# Patient Record
Sex: Female | Born: 1955 | Race: White | Hispanic: No | Marital: Married | Smoking: Never smoker
Health system: Southern US, Community
[De-identification: ages and names within clinical notes are randomized; demographics above are authoritative.]

## PROBLEM LIST (undated history)

## (undated) DIAGNOSIS — M3500 Sicca syndrome, unspecified: Secondary | ICD-10-CM

## (undated) DIAGNOSIS — N3281 Overactive bladder: Secondary | ICD-10-CM

## (undated) DIAGNOSIS — M797 Fibromyalgia: Secondary | ICD-10-CM

## (undated) DIAGNOSIS — Z9109 Other allergy status, other than to drugs and biological substances: Secondary | ICD-10-CM

## (undated) DIAGNOSIS — I4891 Unspecified atrial fibrillation: Secondary | ICD-10-CM

## (undated) DIAGNOSIS — I509 Heart failure, unspecified: Secondary | ICD-10-CM

## (undated) DIAGNOSIS — K5792 Diverticulitis of intestine, part unspecified, without perforation or abscess without bleeding: Secondary | ICD-10-CM

## (undated) DIAGNOSIS — N189 Chronic kidney disease, unspecified: Secondary | ICD-10-CM

## (undated) DIAGNOSIS — J45909 Unspecified asthma, uncomplicated: Secondary | ICD-10-CM

## (undated) DIAGNOSIS — Z950 Presence of cardiac pacemaker: Secondary | ICD-10-CM

## (undated) DIAGNOSIS — I4892 Unspecified atrial flutter: Secondary | ICD-10-CM

## (undated) DIAGNOSIS — K219 Gastro-esophageal reflux disease without esophagitis: Secondary | ICD-10-CM

## (undated) DIAGNOSIS — I499 Cardiac arrhythmia, unspecified: Secondary | ICD-10-CM

## (undated) DIAGNOSIS — M1990 Unspecified osteoarthritis, unspecified site: Secondary | ICD-10-CM

## (undated) DIAGNOSIS — F329 Major depressive disorder, single episode, unspecified: Secondary | ICD-10-CM

## (undated) DIAGNOSIS — F419 Anxiety disorder, unspecified: Secondary | ICD-10-CM

## (undated) DIAGNOSIS — R51 Headache: Secondary | ICD-10-CM

## (undated) DIAGNOSIS — D649 Anemia, unspecified: Secondary | ICD-10-CM

## (undated) DIAGNOSIS — K5790 Diverticulosis of intestine, part unspecified, without perforation or abscess without bleeding: Secondary | ICD-10-CM

## (undated) DIAGNOSIS — M5030 Other cervical disc degeneration, unspecified cervical region: Secondary | ICD-10-CM

## (undated) DIAGNOSIS — K5732 Diverticulitis of large intestine without perforation or abscess without bleeding: Secondary | ICD-10-CM

## (undated) DIAGNOSIS — R112 Nausea with vomiting, unspecified: Secondary | ICD-10-CM

## (undated) DIAGNOSIS — J189 Pneumonia, unspecified organism: Secondary | ICD-10-CM

## (undated) DIAGNOSIS — G2581 Restless legs syndrome: Secondary | ICD-10-CM

## (undated) DIAGNOSIS — Z9889 Other specified postprocedural states: Secondary | ICD-10-CM

## (undated) DIAGNOSIS — Z8614 Personal history of Methicillin resistant Staphylococcus aureus infection: Secondary | ICD-10-CM

## (undated) DIAGNOSIS — I10 Essential (primary) hypertension: Secondary | ICD-10-CM

## (undated) DIAGNOSIS — G4730 Sleep apnea, unspecified: Secondary | ICD-10-CM

## (undated) DIAGNOSIS — C801 Malignant (primary) neoplasm, unspecified: Secondary | ICD-10-CM

## (undated) DIAGNOSIS — F32A Depression, unspecified: Secondary | ICD-10-CM

## (undated) HISTORY — PX: BACK SURGERY: SHX140

## (undated) HISTORY — DX: Sjogren syndrome, unspecified: M35.00

## (undated) HISTORY — PX: SHOULDER ARTHROSCOPY: SHX128

## (undated) HISTORY — PX: INSERT / REPLACE / REMOVE PACEMAKER: SUR710

## (undated) HISTORY — PX: NASAL SEPTUM SURGERY: SHX37

## (undated) HISTORY — PX: ANKLE FUSION: SHX881

## (undated) HISTORY — PX: CHOLECYSTECTOMY: SHX55

## (undated) HISTORY — PX: ANKLE ARTHROSCOPY: SUR85

---

## 2007-05-24 ENCOUNTER — Encounter: Admission: RE | Admit: 2007-05-24 | Discharge: 2007-05-24 | Payer: Self-pay | Admitting: *Deleted

## 2007-05-24 ENCOUNTER — Ambulatory Visit (HOSPITAL_COMMUNITY): Admission: RE | Admit: 2007-05-24 | Discharge: 2007-05-24 | Payer: Self-pay | Admitting: *Deleted

## 2007-06-01 ENCOUNTER — Ambulatory Visit (HOSPITAL_COMMUNITY): Admission: RE | Admit: 2007-06-01 | Discharge: 2007-06-01 | Payer: Self-pay | Admitting: *Deleted

## 2010-10-11 ENCOUNTER — Other Ambulatory Visit: Payer: Self-pay | Admitting: Obstetrics and Gynecology

## 2010-10-11 DIAGNOSIS — R921 Mammographic calcification found on diagnostic imaging of breast: Secondary | ICD-10-CM

## 2010-10-14 ENCOUNTER — Other Ambulatory Visit: Payer: Self-pay | Admitting: Obstetrics and Gynecology

## 2010-10-14 ENCOUNTER — Ambulatory Visit
Admission: RE | Admit: 2010-10-14 | Discharge: 2010-10-14 | Disposition: A | Discharge status: Home / Self Care | Payer: BC Managed Care – PPO | Source: Ambulatory Visit | Attending: Obstetrics and Gynecology | Admitting: Obstetrics and Gynecology

## 2010-10-14 DIAGNOSIS — R921 Mammographic calcification found on diagnostic imaging of breast: Secondary | ICD-10-CM

## 2011-01-15 ENCOUNTER — Other Ambulatory Visit: Payer: Self-pay | Admitting: Obstetrics and Gynecology

## 2011-01-15 DIAGNOSIS — R921 Mammographic calcification found on diagnostic imaging of breast: Secondary | ICD-10-CM

## 2011-02-05 ENCOUNTER — Ambulatory Visit
Admission: RE | Admit: 2011-02-05 | Discharge: 2011-02-05 | Disposition: A | Discharge status: Home / Self Care | Payer: BC Managed Care – PPO | Source: Ambulatory Visit | Attending: Obstetrics and Gynecology | Admitting: Obstetrics and Gynecology

## 2011-02-05 DIAGNOSIS — R921 Mammographic calcification found on diagnostic imaging of breast: Secondary | ICD-10-CM

## 2011-12-08 ENCOUNTER — Other Ambulatory Visit: Payer: Self-pay | Admitting: Obstetrics and Gynecology

## 2011-12-08 DIAGNOSIS — R921 Mammographic calcification found on diagnostic imaging of breast: Secondary | ICD-10-CM

## 2011-12-16 ENCOUNTER — Ambulatory Visit
Admission: RE | Admit: 2011-12-16 | Discharge: 2011-12-16 | Disposition: A | Discharge status: Home / Self Care | Payer: BC Managed Care – PPO | Source: Ambulatory Visit | Attending: Obstetrics and Gynecology | Admitting: Obstetrics and Gynecology

## 2011-12-16 DIAGNOSIS — R921 Mammographic calcification found on diagnostic imaging of breast: Secondary | ICD-10-CM

## 2012-03-18 ENCOUNTER — Encounter (INDEPENDENT_AMBULATORY_CARE_PROVIDER_SITE_OTHER): Payer: BC Managed Care – PPO | Admitting: Hematology and Oncology

## 2012-03-18 DIAGNOSIS — D0590 Unspecified type of carcinoma in situ of unspecified breast: Secondary | ICD-10-CM

## 2012-03-18 DIAGNOSIS — I10 Essential (primary) hypertension: Secondary | ICD-10-CM

## 2012-03-18 DIAGNOSIS — E119 Type 2 diabetes mellitus without complications: Secondary | ICD-10-CM

## 2012-03-18 HISTORY — PX: MASTECTOMY: SHX3

## 2012-04-20 ENCOUNTER — Encounter (INDEPENDENT_AMBULATORY_CARE_PROVIDER_SITE_OTHER): Payer: BC Managed Care – PPO | Admitting: Hematology and Oncology

## 2012-04-20 DIAGNOSIS — R232 Flushing: Secondary | ICD-10-CM

## 2012-04-20 DIAGNOSIS — Z9010 Acquired absence of unspecified breast and nipple: Secondary | ICD-10-CM

## 2012-04-20 DIAGNOSIS — D0590 Unspecified type of carcinoma in situ of unspecified breast: Secondary | ICD-10-CM

## 2012-05-07 ENCOUNTER — Encounter (INDEPENDENT_AMBULATORY_CARE_PROVIDER_SITE_OTHER): Payer: BC Managed Care – PPO | Admitting: Hematology and Oncology

## 2012-05-07 DIAGNOSIS — C50919 Malignant neoplasm of unspecified site of unspecified female breast: Secondary | ICD-10-CM

## 2012-05-07 DIAGNOSIS — K219 Gastro-esophageal reflux disease without esophagitis: Secondary | ICD-10-CM

## 2012-05-07 DIAGNOSIS — I10 Essential (primary) hypertension: Secondary | ICD-10-CM

## 2012-05-20 ENCOUNTER — Encounter (HOSPITAL_BASED_OUTPATIENT_CLINIC_OR_DEPARTMENT_OTHER): Payer: Self-pay | Admitting: *Deleted

## 2012-05-21 ENCOUNTER — Encounter (HOSPITAL_BASED_OUTPATIENT_CLINIC_OR_DEPARTMENT_OTHER)
Admission: RE | Admit: 2012-05-21 | Discharge: 2012-05-21 | Disposition: A | Discharge status: Home / Self Care | Payer: BC Managed Care – PPO | Source: Ambulatory Visit | Attending: Specialist | Admitting: Specialist

## 2012-05-21 LAB — BASIC METABOLIC PANEL
BUN: 11 mg/dL (ref 6–23)
Creatinine, Ser: 0.69 mg/dL (ref 0.50–1.10)
GFR calc Af Amer: >90 mL/min (ref >90)
GFR calc non Af Amer: >90 mL/min (ref >90)

## 2012-05-24 ENCOUNTER — Encounter (HOSPITAL_BASED_OUTPATIENT_CLINIC_OR_DEPARTMENT_OTHER): Payer: Self-pay | Admitting: Anesthesiology

## 2012-05-24 ENCOUNTER — Encounter (HOSPITAL_BASED_OUTPATIENT_CLINIC_OR_DEPARTMENT_OTHER)
Admission: RE | Disposition: A | Discharge status: Home / Self Care | Payer: Self-pay | Source: Ambulatory Visit | Attending: Specialist

## 2012-05-24 ENCOUNTER — Encounter (HOSPITAL_BASED_OUTPATIENT_CLINIC_OR_DEPARTMENT_OTHER): Payer: Self-pay | Admitting: *Deleted

## 2012-05-24 ENCOUNTER — Ambulatory Visit (HOSPITAL_BASED_OUTPATIENT_CLINIC_OR_DEPARTMENT_OTHER): Payer: BC Managed Care – PPO | Admitting: Anesthesiology

## 2012-05-24 ENCOUNTER — Ambulatory Visit (HOSPITAL_BASED_OUTPATIENT_CLINIC_OR_DEPARTMENT_OTHER)
Admission: RE | Admit: 2012-05-24 | Discharge: 2012-05-24 | Disposition: A | Discharge status: Home / Self Care | Payer: BC Managed Care – PPO | Source: Ambulatory Visit | Attending: Specialist | Admitting: Specialist

## 2012-05-24 DIAGNOSIS — Q838 Other congenital malformations of breast: Secondary | ICD-10-CM | POA: Insufficient documentation

## 2012-05-24 DIAGNOSIS — I10 Essential (primary) hypertension: Secondary | ICD-10-CM | POA: Insufficient documentation

## 2012-05-24 DIAGNOSIS — Z421 Encounter for breast reconstruction following mastectomy: Secondary | ICD-10-CM | POA: Insufficient documentation

## 2012-05-24 DIAGNOSIS — Z9010 Acquired absence of unspecified breast and nipple: Secondary | ICD-10-CM | POA: Insufficient documentation

## 2012-05-24 DIAGNOSIS — Z01812 Encounter for preprocedural laboratory examination: Secondary | ICD-10-CM | POA: Insufficient documentation

## 2012-05-24 DIAGNOSIS — E119 Type 2 diabetes mellitus without complications: Secondary | ICD-10-CM | POA: Insufficient documentation

## 2012-05-24 DIAGNOSIS — Z853 Personal history of malignant neoplasm of breast: Secondary | ICD-10-CM | POA: Insufficient documentation

## 2012-05-24 DIAGNOSIS — Z01810 Encounter for preprocedural cardiovascular examination: Secondary | ICD-10-CM | POA: Insufficient documentation

## 2012-05-24 HISTORY — DX: Gastro-esophageal reflux disease without esophagitis: K21.9

## 2012-05-24 HISTORY — PX: TISSUE EXPANDER PLACEMENT: SHX2530

## 2012-05-24 HISTORY — DX: Headache: R51

## 2012-05-24 HISTORY — PX: BREAST RECONSTRUCTION: SHX9

## 2012-05-24 HISTORY — DX: Anxiety disorder, unspecified: F41.9

## 2012-05-24 HISTORY — DX: Nausea with vomiting, unspecified: R11.2

## 2012-05-24 HISTORY — DX: Major depressive disorder, single episode, unspecified: F32.9

## 2012-05-24 HISTORY — DX: Depression, unspecified: F32.A

## 2012-05-24 HISTORY — DX: Sleep apnea, unspecified: G47.30

## 2012-05-24 HISTORY — DX: Essential (primary) hypertension: I10

## 2012-05-24 HISTORY — DX: Other specified postprocedural states: Z98.890

## 2012-05-24 LAB — GLUCOSE, CAPILLARY: Glucose-Capillary: 183 mg/dL — ABNORMAL HIGH (ref 70–99)

## 2012-05-24 SURGERY — RECONSTRUCTION, BREAST
Anesthesia: General | Site: Breast | Laterality: Right | Wound class: Clean

## 2012-05-24 MED ORDER — HYDROMORPHONE HCL PF 1 MG/ML IJ SOLN
0.2500 mg | INTRAMUSCULAR | Status: DC | PRN
  Administered 2012-05-24 (×2): 0.5 mg via INTRAVENOUS

## 2012-05-24 MED ORDER — ACETAMINOPHEN 10 MG/ML IV SOLN
1000.0000 mg | Freq: Once | INTRAVENOUS | Status: AC
  Administered 2012-05-24: 1000 mg via INTRAVENOUS

## 2012-05-24 MED ORDER — CEFAZOLIN SODIUM 1 G IJ SOLR
INTRAMUSCULAR | Status: DC | PRN
  Administered 2012-05-24: 1 g via INTRAMUSCULAR

## 2012-05-24 MED ORDER — OXYCODONE HCL 5 MG/5ML PO SOLN: 5.0000 mg | Freq: Once | ORAL | Status: DC | PRN

## 2012-05-24 MED ORDER — OXYCODONE HCL 5 MG PO TABS: 5.0000 mg | ORAL_TABLET | Freq: Once | ORAL | Status: DC | PRN

## 2012-05-24 MED ORDER — SUCCINYLCHOLINE CHLORIDE 20 MG/ML IJ SOLN
INTRAMUSCULAR | Status: DC | PRN
  Administered 2012-05-24: 100 mg via INTRAVENOUS

## 2012-05-24 MED ORDER — ONDANSETRON HCL 4 MG/2ML IJ SOLN
INTRAMUSCULAR | Status: DC | PRN
  Administered 2012-05-24: 4 mg via INTRAVENOUS

## 2012-05-24 MED ORDER — MIDAZOLAM HCL 5 MG/5ML IJ SOLN
INTRAMUSCULAR | Status: DC | PRN
  Administered 2012-05-24: 2 mg via INTRAVENOUS

## 2012-05-24 MED ORDER — GLYCOPYRROLATE 0.2 MG/ML IJ SOLN
INTRAMUSCULAR | Status: DC | PRN
  Administered 2012-05-24: 0.6 mg via INTRAVENOUS

## 2012-05-24 MED ORDER — SODIUM CHLORIDE 0.9 % IV SOLN
INTRAVENOUS | Status: DC | PRN
  Administered 2012-05-24: 500 mL via INTRAMUSCULAR

## 2012-05-24 MED ORDER — LIDOCAINE HCL (CARDIAC) 20 MG/ML IV SOLN
INTRAVENOUS | Status: DC | PRN
  Administered 2012-05-24: 50 mg via INTRAVENOUS

## 2012-05-24 MED ORDER — DEXAMETHASONE SODIUM PHOSPHATE 4 MG/ML IJ SOLN
INTRAMUSCULAR | Status: DC | PRN
  Administered 2012-05-24: 10 mg via INTRAVENOUS

## 2012-05-24 MED ORDER — NEOSTIGMINE METHYLSULFATE 1 MG/ML IJ SOLN
INTRAMUSCULAR | Status: DC | PRN
  Administered 2012-05-24: 4 mg via INTRAVENOUS

## 2012-05-24 MED ORDER — CEFAZOLIN SODIUM-DEXTROSE 2-3 GM-% IV SOLR
2.0000 g | INTRAVENOUS | Status: AC
  Administered 2012-05-24: 3 g via INTRAVENOUS

## 2012-05-24 MED ORDER — PROPOFOL 10 MG/ML IV BOLUS
INTRAVENOUS | Status: DC | PRN
  Administered 2012-05-24: 200 mg via INTRAVENOUS

## 2012-05-24 MED ORDER — LACTATED RINGERS IV SOLN
INTRAVENOUS | Status: DC
  Administered 2012-05-24: 07:00:00 via INTRAVENOUS

## 2012-05-24 MED ORDER — SCOPOLAMINE 1 MG/3DAYS TD PT72
1.0000 | MEDICATED_PATCH | TRANSDERMAL | Status: DC
  Administered 2012-05-24: 1.5 mg via TRANSDERMAL

## 2012-05-24 MED ORDER — FENTANYL CITRATE 0.05 MG/ML IJ SOLN
INTRAMUSCULAR | Status: DC | PRN
  Administered 2012-05-24: 100 ug via INTRAVENOUS
  Administered 2012-05-24 (×2): 50 ug via INTRAVENOUS

## 2012-05-24 MED ORDER — SODIUM CHLORIDE 0.9 % IV SOLN
INTRAVENOUS | Status: DC | PRN
  Administered 2012-05-24: 1000 mL via INTRAMUSCULAR

## 2012-05-24 MED ORDER — ROCURONIUM BROMIDE 100 MG/10ML IV SOLN
INTRAVENOUS | Status: DC | PRN
  Administered 2012-05-24: 20 mg via INTRAVENOUS
  Administered 2012-05-24: 10 mg via INTRAVENOUS
  Administered 2012-05-24: 30 mg via INTRAVENOUS

## 2012-05-24 MED ORDER — HYDRALAZINE HCL 20 MG/ML IJ SOLN
INTRAMUSCULAR | Status: DC | PRN
  Administered 2012-05-24: 5 mg via INTRAVENOUS

## 2012-05-24 MED ORDER — LIDOCAINE-EPINEPHRINE 0.5 %-1:200000 IJ SOLN
INTRAMUSCULAR | Status: DC | PRN
  Administered 2012-05-24: 100 mL

## 2012-05-24 SURGICAL SUPPLY — 69 items
BAG DECANTER FOR FLEXI CONT (MISCELLANEOUS) ×2 IMPLANT
BENZOIN TINCTURE PRP APPL 2/3 (GAUZE/BANDAGES/DRESSINGS) ×2 IMPLANT
BLADE HEX COATED 2.75 (ELECTRODE) ×2 IMPLANT
BLADE KNIFE PERSONA 10 (BLADE) ×2 IMPLANT
BLADE KNIFE PERSONA 15 (BLADE) ×2 IMPLANT
CANISTER SUCTION 1200CC (MISCELLANEOUS) ×2 IMPLANT
CLOTH BEACON ORANGE TIMEOUT ST (SAFETY) ×2 IMPLANT
COVER MAYO STAND STRL (DRAPES) ×2 IMPLANT
COVER TABLE BACK 60X90 (DRAPES) ×2 IMPLANT
DECANTER SPIKE VIAL GLASS SM (MISCELLANEOUS) ×2 IMPLANT
DRAIN CHANNEL 10M FLAT 3/4 FLT (DRAIN) ×2 IMPLANT
DRAIN CHANNEL 7F 3/4 FLAT (WOUND CARE) ×4 IMPLANT
DRAIN PENROSE 1/4X12 LTX STRL (WOUND CARE) IMPLANT
DRAPE LAPAROSCOPIC ABDOMINAL (DRAPES) ×2 IMPLANT
DRSG PAD ABDOMINAL 8X10 ST (GAUZE/BANDAGES/DRESSINGS) ×4 IMPLANT
ELECT BLADE 6.5 .24CM SHAFT (ELECTRODE) ×2 IMPLANT
ELECT REM PT RETURN 9FT ADLT (ELECTROSURGICAL) ×2
ELECTRODE REM PT RTRN 9FT ADLT (ELECTROSURGICAL) ×1 IMPLANT
EVACUATOR SILICONE 100CC (DRAIN) ×6 IMPLANT
FILTER 7/8 IN (FILTER) ×2 IMPLANT
GAUZE SPONGE 4X4 12PLY STRL LF (GAUZE/BANDAGES/DRESSINGS) IMPLANT
GAUZE XEROFORM 1X8 LF (GAUZE/BANDAGES/DRESSINGS) ×2 IMPLANT
GAUZE XEROFORM 5X9 LF (GAUZE/BANDAGES/DRESSINGS) ×2 IMPLANT
GLOVE BIO SURGEON STRL SZ 6.5 (GLOVE) ×2 IMPLANT
GLOVE BIOGEL M STRL SZ7.5 (GLOVE) ×2 IMPLANT
GLOVE BIOGEL PI IND STRL 8 (GLOVE) ×1 IMPLANT
GLOVE BIOGEL PI INDICATOR 8 (GLOVE) ×1
GLOVE ECLIPSE 7.0 STRL STRAW (GLOVE) ×2 IMPLANT
GOWN PREVENTION PLUS XLARGE (GOWN DISPOSABLE) IMPLANT
GOWN PREVENTION PLUS XXLARGE (GOWN DISPOSABLE) ×4 IMPLANT
IMPL SALINE SMOOTH RND 575 (Breast) ×1 IMPLANT
IMPLANT SALINE SMOOTH RND 575 (Breast) ×2 IMPLANT
IV NS 500ML (IV SOLUTION) ×4
IV NS 500ML BAXH (IV SOLUTION) ×4 IMPLANT
KIT FILL SYSTEM UNIVERSAL (SET/KITS/TRAYS/PACK) IMPLANT
NDL SAFETY ECLIPSE 18X1.5 (NEEDLE) ×1 IMPLANT
NEEDLE HYPO 18GX1.5 SHARP (NEEDLE) ×1
NEEDLE HYPO 25X1 1.5 SAFETY (NEEDLE) IMPLANT
NEEDLE SPNL 18GX3.5 QUINCKE PK (NEEDLE) ×2 IMPLANT
NS IRRIG 1000ML POUR BTL (IV SOLUTION) ×2 IMPLANT
PACK BASIN DAY SURGERY FS (CUSTOM PROCEDURE TRAY) ×2 IMPLANT
PEN SKIN MARKING BROAD TIP (MISCELLANEOUS) ×2 IMPLANT
PIN SAFETY STERILE (MISCELLANEOUS) ×2 IMPLANT
SLEEVE SCD COMPRESS KNEE MED (MISCELLANEOUS) IMPLANT
SPECIMEN JAR MEDIUM (MISCELLANEOUS) IMPLANT
SPONGE GAUZE 4X4 12PLY (GAUZE/BANDAGES/DRESSINGS) ×2 IMPLANT
SPONGE LAP 18X18 X RAY DECT (DISPOSABLE) ×6 IMPLANT
STRIP SUTURE WOUND CLOSURE 1/2 (SUTURE) ×4 IMPLANT
SUT ETHILON 3 0 PS 1 (SUTURE) ×2 IMPLANT
SUT MNCRL AB 3-0 PS2 18 (SUTURE) ×4 IMPLANT
SUT MON AB 2-0 CT1 36 (SUTURE) ×2 IMPLANT
SUT MON AB 5-0 PS2 18 (SUTURE) IMPLANT
SUT PROLENE 3 0 PS 1 (SUTURE) ×2 IMPLANT
SUT PROLENE 3 0 PS 2 (SUTURE) IMPLANT
SUT PROLENE 4 0 PS 2 18 (SUTURE) IMPLANT
SYR 20CC LL (SYRINGE) IMPLANT
SYR 50ML LL SCALE MARK (SYRINGE) ×4 IMPLANT
SYR BULB IRRIGATION 50ML (SYRINGE) ×2 IMPLANT
SYR CONTROL 10ML LL (SYRINGE) ×2 IMPLANT
TAPE HYPAFIX 6X30 (GAUZE/BANDAGES/DRESSINGS) ×2 IMPLANT
TAPE MEASURE 72IN RETRACT (INSTRUMENTS)
TAPE MEASURE LINEN 72IN RETRCT (INSTRUMENTS) IMPLANT
TOWEL OR 17X24 6PK STRL BLUE (TOWEL DISPOSABLE) ×8 IMPLANT
TRAY DSU PREP LF (CUSTOM PROCEDURE TRAY) ×2 IMPLANT
TUBE CONNECTING 20X1/4 (TUBING) ×4 IMPLANT
UNDERPAD 30X30 INCONTINENT (UNDERPADS AND DIAPERS) ×4 IMPLANT
VAC PENCILS W/TUBING CLEAR (MISCELLANEOUS) ×2 IMPLANT
WATER STERILE IRR 1000ML POUR (IV SOLUTION) ×2 IMPLANT
YANKAUER SUCT BULB TIP NO VENT (SUCTIONS) ×2 IMPLANT

## 2012-05-24 NOTE — Transfer of Care (Signed)
Immediate Anesthesia Transfer of Care Note  Patient: Heidi [name]  Procedure(s) Performed: Procedure(s) (LRB) with comments: BREAST RECONSTRUCTION (Right) - NO FLEX HD TISSUE EXPANDER (Right)  Patient Location: PACU  Anesthesia Type: General  Level of Consciousness: awake, alert  and oriented  Airway & Oxygen Therapy: Patient Spontanous Breathing and Patient connected to face mask oxygen  Post-op Assessment: Report given to PACU RN and Post -op Vital signs reviewed and stable  Post vital signs: Reviewed and stable  Complications: No apparent anesthesia complications

## 2012-05-24 NOTE — Brief Op Note (Signed)
05/24/2012  9:09 AM  PATIENT:  Heidi [name]  56 y.o. female  PRE-OPERATIVE DIAGNOSIS:  RIGHT BREAST CANCER  POST-OPERATIVE DIAGNOSIS:   POST RIGHT BREAST CANCER  PROCEDURE:  Procedure(s) (LRB) with comments: BREAST RECONSTRUCTION (Right) - NO FLEX HD TISSUE EXPANDER (Right)  SURGEON:  Surgeon(s) and Role:    * Louisa Second, MD - Primary  PHYSICIAN ASSISTANT:   ASSISTANTS: none   ANESTHESIA:   general  EBL:  Total I/O In: 1100 [I.V.:1100] Out: -   BLOOD ADMINISTERED:none  DRAINS:  Right and left lateral chest  LOCAL MEDICATIONS USED:  LIDOCAINE   SPECIMEN:  Excision  DISPOSITION OF SPECIMEN:  PATHOLOGY  COUNTS:  YES  TOURNIQUET:  * No tourniquets in log *  DICTATION: .Other Dictation: Dictation Number [id number]  PLAN OF CARE: Discharge to home after PACU  PATIENT DISPOSITION:  PACU - hemodynamically stable.   Delay start of Pharmacological VTE agent (>24hrs) due to surgical blood loss or risk of bleeding: not applicable

## 2012-05-24 NOTE — Anesthesia Postprocedure Evaluation (Signed)
  Anesthesia Post-op Note  Patient: Heidi [name]  Procedure(s) Performed: Procedure(s) (LRB) with comments: BREAST RECONSTRUCTION (Right) - NO FLEX HD TISSUE EXPANDER (Right)  Patient Location: PACU  Anesthesia Type: General  Level of Consciousness: awake, alert  and oriented  Airway and Oxygen Therapy: Patient Spontanous Breathing and Patient connected to face mask oxygen  Post-op Pain: moderate  Post-op Assessment: Post-op Vital signs reviewed, Patient's Cardiovascular Status Stable, Respiratory Function Stable, Patent Airway and No signs of Nausea or vomiting  Post-op Vital Signs: Reviewed and stable  Complications: No apparent anesthesia complications

## 2012-05-24 NOTE — Anesthesia Procedure Notes (Signed)
Procedure Name: Intubation Performed by: Malikiah Debarr W Pre-anesthesia Checklist: Patient identified, Timeout performed, Emergency Drugs available, Suction available and Patient being monitored Patient Re-evaluated:Patient Re-evaluated prior to inductionOxygen Delivery Method: Circle system utilized Preoxygenation: Pre-oxygenation with 100% oxygen Intubation Type: IV induction Ventilation: Mask ventilation without difficulty Laryngoscope Size: Miller and 2 Grade View: Grade I Tube type: Oral Tube size: 7.0 mm Number of attempts: 1 Airway Equipment and Method: Stylet Placement Confirmation: ETT inserted through vocal cords under direct vision,  breath sounds checked- equal and bilateral and positive ETCO2 Secured at: 22 cm Tube secured with: Tape Dental Injury: Teeth and Oropharynx as per pre-operative assessment      

## 2012-05-24 NOTE — H&P (Signed)
Heidi [name] is an 56 y.o. female.   Chief Complaint: Absence of right breast HPI: SP right breast removal for breast cancer. Admitted for reconstruction with tissue expander  Past Medical History  Diagnosis Date  . Anxiety   . Depression   . Hypertension   . Sleep apnea     uses CPAP nightly  . Bronchitis     uses ProAir prn  . Diabetes mellitus     IDDM  . GERD (gastroesophageal reflux disease)   . Headache   . PONV (postoperative nausea and vomiting)     Past Surgical History  Procedure Date  . Mastectomy 03-2012    Rt  . Shoulder arthroscopy     RT  . Ankle arthroscopy     LT  . Cesarean section     History reviewed. No pertinent family history. Social History:  reports that she has never smoked. She does not have any smokeless tobacco history on file. She reports that she does not drink alcohol or use illicit drugs.  Allergies:  Allergies  Allergen Reactions  . Sulfa Antibiotics Rash    Medications Prior to Admission  Medication Sig Dispense Refill  . benazepril (LOTENSIN) 20 MG tablet Take 20 mg by mouth daily.      . benazepril-hydrochlorthiazide (LOTENSIN HCT) 20-12.5 MG per tablet Take 1 tablet by mouth daily.      . clonazePAM (KLONOPIN) 1 MG tablet Take 1 mg by mouth 2 (two) times daily as needed.      Marland Kitchen dexlansoprazole (DEXILANT) 60 MG capsule Take 60 mg by mouth daily.      . DULoxetine (CYMBALTA) 60 MG capsule Take 60 mg by mouth daily.      . fexofenadine-pseudoephedrine (ALLEGRA-D 24) 180-240 MG per 24 hr tablet Take 1 tablet by mouth daily.      . hydrOXYzine (ATARAX/VISTARIL) 25 MG tablet Take 25 mg by mouth 3 (three) times daily as needed.      Marland Kitchen ibuprofen (ADVIL,MOTRIN) 800 MG tablet Take 800 mg by mouth every 8 (eight) hours as needed.      . insulin lispro protamine-insulin lispro (HUMALOG 75/25) (75-25) 100 UNIT/ML SUSP Inject 7 Units into the skin daily with breakfast.      . lurasidone (LATUDA) 40 MG TABS Take by mouth daily with  breakfast.      . oxybutynin (DITROPAN-XL) 10 MG 24 hr tablet Take 10 mg by mouth daily.      . ranitidine (ZANTAC) 150 MG tablet Take 150 mg by mouth 2 (two) times daily.      Marland Kitchen topiramate (TOPAMAX) 200 MG tablet Take 200 mg by mouth 2 (two) times daily.      . traZODone (DESYREL) 150 MG tablet Take 150 mg by mouth at bedtime.      Marland Kitchen albuterol (PROVENTIL) (2.5 MG/3ML) 0.083% nebulizer solution Take 2.5 mg by nebulization every 6 (six) hours as needed.      . carbamazepine (TEGRETOL) 100 MG chewable tablet Chew 200 mg by mouth 2 (two) times daily.      . norethindrone (AYGESTIN) 5 MG tablet Take 5 mg by mouth daily.        Results for orders placed during the hospital encounter of 05/24/12 (from the past 48 hour(s))  GLUCOSE, CAPILLARY     Status: Abnormal   Collection Time   05/24/12  6:41 AM      Component Value Range Comment   Glucose-Capillary 135 (*) 70 - 99 mg/dL    No  results found.  Review of Systems  Constitutional: Negative.   HENT: Negative.   Eyes: Negative.   Respiratory: Negative.   Cardiovascular: Negative.   Gastrointestinal: Negative.   Genitourinary: Negative.   Musculoskeletal: Negative.   Skin: Positive for rash.  Endo/Heme/Allergies: Negative.   Psychiatric/Behavioral: Negative.     Blood pressure 113/70, pulse 50, temperature 98 F (36.7 C), temperature source Oral, resp. rate 18, height 5\' 5"  (1.651 m), weight 132.11 kg (291 lb 4 oz), last menstrual period 05/20/2012, SpO2 96.00%. Physical Exam   Assessment/Plan Right breast cancer / Reconstruction with tissue expander  Joell Usman L 05/24/2012, 7:39 AM

## 2012-05-24 NOTE — Anesthesia Preprocedure Evaluation (Signed)
Anesthesia Evaluation  Patient identified by MRN, date of birth, ID band Patient awake    Reviewed: Allergy & Precautions, H&P , NPO status , Patient's Chart, lab work & pertinent test results  History of Anesthesia Complications (+) PONV  Airway Mallampati: III TM Distance: >3 FB Neck ROM: Full    Dental No notable dental hx. (+) Teeth Intact and Dental Advisory Given   Pulmonary sleep apnea and Continuous Positive Airway Pressure Ventilation ,  breath sounds clear to auscultation  Pulmonary exam normal       Cardiovascular hypertension, On Medications Rhythm:Regular Rate:Normal     Neuro/Psych PSYCHIATRIC DISORDERS negative neurological ROS     GI/Hepatic Neg liver ROS, GERD-  Medicated and Controlled,  Endo/Other  diabetes, Type 2, Oral Hypoglycemic AgentsMorbid obesity  Renal/GU negative Renal ROS  negative genitourinary   Musculoskeletal   Abdominal (+) + obese,   Peds  Hematology negative hematology ROS (+)   Anesthesia Other Findings   Reproductive/Obstetrics negative OB ROS                           Anesthesia Physical Anesthesia Plan  ASA: III  Anesthesia Plan: General   Post-op Pain Management:    Induction: Intravenous  Airway Management Planned: Oral ETT and Video Laryngoscope Planned  Additional Equipment:   Intra-op Plan:   Post-operative Plan: Extubation in OR  Informed Consent: I have reviewed the patients History and Physical, chart, labs and discussed the procedure including the risks, benefits and alternatives for the proposed anesthesia with the patient or authorized representative who has indicated his/her understanding and acceptance.   Dental advisory given  Plan Discussed with: CRNA  Anesthesia Plan Comments:         Anesthesia Quick Evaluation

## 2012-05-25 ENCOUNTER — Encounter (HOSPITAL_BASED_OUTPATIENT_CLINIC_OR_DEPARTMENT_OTHER): Payer: Self-pay | Admitting: Specialist

## 2012-05-25 NOTE — Op Note (Signed)
NAMETIMMIE, CALIX               ACCOUNT NO.:  [id number]  MEDICAL RECORD NO.:  [id number]  LOCATION:                               FACILITY:  [facility]  PHYSICIAN:  Earvin Hansen L. [name], M.D.   DATE OF BIRTH:  DATE OF PROCEDURE:  05/24/2012 DATE OF DISCHARGE:  05/24/2012                              OPERATIVE REPORT   A 56 year old lady with history of right breast cancer.  She has had a mastectomy done and is now being prepared for reconstruction first stage.  She also has severe amount of accessory breast tissue on the right side in the axillary region that is going to be excised as well.  PROCEDURES PLANNED: 1. Right breast reconstruction using tissue expansion, subpectoral     placement. 2. Excision of a large accessory breast tissue, right breast.  ANESTHESIA:  General.  DESCRIPTION OF PROCEDURE:  Preoperatively, the patient was set up and drawn for the reconstruction, marked the midline as well as the inframammary fold lines as well as the demarcation of all the large masses of accessory breast tissue.  She then underwent general anesthesia, intubated orally.  Prep was done to the chest, breast areas in routine fashion using Hibiclens soap and solution, walled off with sterile towels and drapes so as to make a sterile field.  Xylocaine 0.5% with epinephrine was injected locally, a total of 150 mL at the right chest area.  After sterile towels and drapes were placed, the previous inframammary incision and transverse incision of the breast was entered with a #15 blade, carried down through the skin and subcutaneous tissue to underlying pectoralis major muscle.  The muscle was then divided in direction of its fibers and subpectoral dissection was done medially, laterally, inferiorly, and superiorly.  Next using light retraction, I was able to dissect a plane more using the Bovie anticoagulation over the fifth rib down to the previously drawn inframammary fold.  We  then outlined excision for the accessory breast tissue as well.  Subpectoral plane was packed.  Excision of a large elliptical mass of accessory breast tissue was then done using the Bovie for cutting and coagulation. Hemostasis again maintained with the Bovie anticoagulation.  This piece was then removed from the field.  The flaps were advanced and then closed with multiple sutures of 2-0 Monocryl deep subcutaneously and then running subcuticular stitch of 3-0 Monocryl.  Next attention was drawn back to the subpectoral dissection.  The packs were removed. Hemostasis was maintained and then a mentor tissue expander was placed in this space and this is  a style 1400.  We placed 700 mL in it.  Lot number is [id number], serial number is [id number].  Total of 7 mL of sterile saline was placed with a gram of Ancef.  This fit in very nicely with the field system.  The muscles were then reclosed with 2-0 Monocryl.  I was able to make a subcutaneous plane laterally to this to allow the port to be situated and stay with 3-0 Monocryl.  The subcutaneous closure was done with 3-0 Monocryl and then a running subcuticular stitch of 3-0 Monocryl.  We placed a drain in each space on the  accessory breast tissue, a #7 Blake drain as well as at the reconstruction site. Reconstruction site drain was labeled #1.  The accessory breast tissue site drain was labeled #2.  The wounds were cleansed.  Sterile drapes were applied.  Steri-Strips, 4x4s, ABDs, and Hypafix tape.  She withstood all the procedures very, very well and was taken to recovery in excellent condition.  ESTIMATED BLOOD LOSS:  150 mL.  COMPLICATIONS:  None.  The patient will be discharged today.  Postop instructions were given to her preoperatively as well as prescriptions for both pain and antibiotic therapy which we started antibiotics 2 days ago to allow the antibiotics to get into the soft tissue since this patient is  diabetic.     [name]. [name], M.D.     [name]  D:  05/24/2012  T:  05/24/2012  Job:  [id number]

## 2012-05-26 LAB — POCT HEMOGLOBIN-HEMACUE: Hemoglobin: 12.5 g/dL (ref 12.0–15.0)

## 2012-08-18 DIAGNOSIS — Z8614 Personal history of Methicillin resistant Staphylococcus aureus infection: Secondary | ICD-10-CM

## 2012-08-18 HISTORY — DX: Personal history of Methicillin resistant Staphylococcus aureus infection: Z86.14

## 2012-09-23 ENCOUNTER — Ambulatory Visit (INDEPENDENT_AMBULATORY_CARE_PROVIDER_SITE_OTHER): Payer: BC Managed Care – PPO | Admitting: Internal Medicine

## 2012-09-23 ENCOUNTER — Ambulatory Visit: Payer: BC Managed Care – PPO | Admitting: Internal Medicine

## 2012-09-23 ENCOUNTER — Encounter: Payer: Self-pay | Admitting: Internal Medicine

## 2012-09-23 VITALS — BP 153/77 | HR 62 | Temp 98.4°F | Ht 66.0 in | Wt 274.0 lb

## 2012-09-23 DIAGNOSIS — T8149XA Infection following a procedure, other surgical site, initial encounter: Secondary | ICD-10-CM

## 2012-09-23 DIAGNOSIS — T8140XA Infection following a procedure, unspecified, initial encounter: Secondary | ICD-10-CM

## 2012-09-23 NOTE — Assessment & Plan Note (Signed)
She does have some purulence from her sites and I have set this off for culture. I am going to await culture prior to starting antibiotics since the possibilities are broad including gram-positive and gram-negative. I will direct therapy to the bacteria. If it is culture negative I will use IV vancomycin and oral Levaquin.

## 2012-09-23 NOTE — Progress Notes (Signed)
  Subjective:    Patient ID: Heidi [name], female    DOB: 1955/10/18, 57 y.o.   MRN: [id number]  HPI She comes in here for evaluation of her postoperative forms. She has history of breast cancer and had breast removal and in October of 2013 had stage I of the breast reconstructive surgery here. Subsequently she did develop some wound infections and despite multiple courses of antibiotics she has had recurrence of her wounds. She has not had any wound cultures in the past.  She otherwise has had no fever or chills. She does have periods where it improves however then developed an abscess and bursts and requires incision and drainage by her surgeon Dr. [name]. She does relate a history of a staph aureus boil on the back of her neck prior to surgery.  She has been on multiple courses of antibiotics including doxycycline, oral vancomycin( no diarrhea or C. Difficile), and ciprofloxacin that she knows of.     Review of Systems  Constitutional: Negative for fever, chills and fatigue.  Gastrointestinal: Negative for nausea, abdominal pain, diarrhea and constipation.  Skin: Negative for rash.  Psychiatric/Behavioral: The patient is nervous/anxious.        Objective:   Physical Exam  Constitutional: She appears well-developed and well-nourished. No distress.  Skin:       Wound of her right breast area opened under the inferior lateral portion that is about 3 cm long opening with some purulence  2 smaller lesions that are open with some purulence one is about 2-3 mm          Assessment & Plan:

## 2012-09-27 ENCOUNTER — Encounter: Payer: Self-pay | Admitting: Internal Medicine

## 2012-09-27 ENCOUNTER — Telehealth: Payer: Self-pay | Admitting: *Deleted

## 2012-09-27 ENCOUNTER — Other Ambulatory Visit: Payer: Self-pay | Admitting: *Deleted

## 2012-09-27 LAB — WOUND CULTURE

## 2012-09-27 NOTE — Progress Notes (Signed)
error 

## 2012-09-27 NOTE — Telephone Encounter (Signed)
PICC set up as outpatient at [facility], 09/28/12 10:30 am.  [facility] will administer first dose of vancomycin that day.  Will fax lab results (BUN/Creatinine) to [phone] when they are faxed to [place] from Dr. Charlesetta Garibaldi office. Faxed vancomycin orders to [facility] for pharmacy titration, trough goal of 12-15.  They will coordinate delivery/adminstration of antibiotics.

## 2012-10-02 ENCOUNTER — Other Ambulatory Visit: Payer: Self-pay

## 2012-10-04 ENCOUNTER — Encounter: Payer: Self-pay | Admitting: Internal Medicine

## 2012-10-04 ENCOUNTER — Telehealth: Payer: Self-pay | Admitting: *Deleted

## 2012-10-04 NOTE — Telephone Encounter (Signed)
Patient called c/o frequent urination and gastric reflux and wants to know if this could be related to the vancomycin. Please advise Heidi [name]

## 2012-10-05 NOTE — Telephone Encounter (Signed)
no

## 2012-10-05 NOTE — Telephone Encounter (Signed)
Patient notified Heidi [name]  

## 2012-10-07 ENCOUNTER — Encounter: Payer: Self-pay | Admitting: Internal Medicine

## 2012-10-07 ENCOUNTER — Ambulatory Visit (INDEPENDENT_AMBULATORY_CARE_PROVIDER_SITE_OTHER): Payer: BC Managed Care – PPO | Admitting: Internal Medicine

## 2012-10-07 VITALS — BP 150/84 | HR 67 | Temp 98.5°F | Ht 66.0 in | Wt 273.0 lb

## 2012-10-07 DIAGNOSIS — T8149XA Infection following a procedure, other surgical site, initial encounter: Secondary | ICD-10-CM

## 2012-10-07 DIAGNOSIS — T8140XA Infection following a procedure, unspecified, initial encounter: Secondary | ICD-10-CM

## 2012-10-07 MED ORDER — DOXYCYCLINE HYCLATE 100 MG PO TABS: 100.0000 mg | ORAL_TABLET | Freq: Two times a day (BID) | ORAL | Status: DC

## 2012-10-07 NOTE — Progress Notes (Signed)
  Subjective:    Patient ID: Heidi [name], female    DOB: [birth year], 57 y.o.   MRN: [id number]  HPI This in for followup of her wound infection. She is status post mastectomy and developed wounds over the last several months that have been difficult to heal. She's tried multiple antibiotics including 2 courses of doxycycline for about 7-10 days. I saw her last week and cultured the area which did grow MRSA which is sensitive to doxycycline and vancomycin. I started her on IV vancomycin for which she is now about one week into her course. No fever but does endorse some occasional chills. She feels her wound is about the same. She continues to be distressed about the wounds and healing.   Review of Systems  Constitutional: Positive for chills. Negative for fever.  Gastrointestinal: Negative for nausea, abdominal pain and diarrhea.       Objective:   Physical Exam  Skin:  Her right breast area has one persistent wound at the bottom right quadrant that is open as before but with no drainage. No surrounding erythema. Another area that is about one half a centimeter that is new and slight erythema with some fleshy-like consistency          Assessment & Plan:

## 2012-10-07 NOTE — Assessment & Plan Note (Signed)
I do feel this wound is healing up nicely with much less drainage. The culture was from inside the wound so suspect this is the the bacteria. I will continue her for one more week on vancomycin and then I will switch her to doxycycline for another 2 weeks. I will then followup with her at that time. She is going to see her surgeon in the meantime to further manage the wounds and healing. I do though think this is clearing up nicely and did not suspect a prolonged infection

## 2012-10-08 ENCOUNTER — Encounter: Payer: Self-pay | Admitting: Internal Medicine

## 2012-10-12 ENCOUNTER — Telehealth: Payer: Self-pay | Admitting: *Deleted

## 2012-10-12 NOTE — Telephone Encounter (Signed)
Patient notified. Orders faxed to [facility]. Thanks!

## 2012-10-12 NOTE — Telephone Encounter (Signed)
Sounds good.  Doxy should already be at her pharmacy.  It is 100 mg po bid for 2 weeks starting after she finishes her vancomycin. thanks

## 2012-10-12 NOTE — Telephone Encounter (Signed)
Patient and her [facility] RN called, reporting her PICC line pulled out "5 black marks."  RN was able to flush the PICC, but not draw back any blood.  She drew labs via peripheral stick this morning.  Order faxed to [facility] for CXR to confirm PICC placement.  They will call [phone] with results.

## 2012-10-12 NOTE — Telephone Encounter (Signed)
It is still in a good place, ok to continue.  I think for a day or two more and then to oral doxy.  Thanks

## 2012-10-12 NOTE — Telephone Encounter (Signed)
CXR results: "Left-sided PICC line catheter tip terminates in the SVC. Heart size is within normal limits.  The lungs are clear." Please advise if the [facility] RN should pull PICC and stop IV ABX, if it should be replaced, or if it should be stabilized for continuation of IV ABX.

## 2012-10-12 NOTE — Telephone Encounter (Signed)
Notified patient, she is in agreement. She has enough vancomycin to go through Friday, 10/15/12.  [facility] RN can pull PICC Monday, 10/18/12 if you would like.  Please advise on the dosage and duration of doxy therapy for the pharmacy.  I'll be happy to phone that in.

## 2012-10-15 ENCOUNTER — Telehealth: Payer: Self-pay | Admitting: *Deleted

## 2012-10-15 NOTE — Telephone Encounter (Signed)
Verbal order per Dr. Luciana Axe given to [facility] to pull patient's picc line on 10/18/12. Wendall Mola

## 2012-10-15 NOTE — Telephone Encounter (Signed)
Patient still has large holes with drainage, concerned that doxy will not be effective as she has taken this many times in the past without cure.  Concerned that the wounds have not closed more before IV ABX therapy completed (today 10/15/12).  Has the doxy at home to start tomorrow, but would like to know if meropenem is an option before beginning the doxy and before the PICC is pulled on Monday.   (RN also counseled patient on nutritional therapy to assist in wound healing - lean protein, Vitamin C and zinc.) Please advise.  Andree Coss, RN

## 2012-10-15 NOTE — Telephone Encounter (Signed)
Not sure who is around today.  See note from [place].   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  The only antibiotics that are sensitive are vancomycin and doxycycline.  There is little difference between the effectiveness of the two antibiotics so no benefit with prolonged IV therapy, and there  Is risk.  It will likely take weeks before the wound is closed and there is no benefit to continuing IV antibiotics for the wound to heal.  There will be drainage until the wound has closed, that is expected.    Thanks

## 2012-10-15 NOTE — Telephone Encounter (Signed)
Patient notified Heidi [name]  

## 2012-10-20 ENCOUNTER — Encounter: Payer: Self-pay | Admitting: Infectious Diseases

## 2012-10-20 ENCOUNTER — Telehealth: Payer: Self-pay | Admitting: Licensed Clinical Social Worker

## 2012-10-20 NOTE — Telephone Encounter (Signed)
Patient states she has been extremely nauseated since starting Doxycycline, she feels very fatigue and overall not feeling well. She has been taking Phenergan which has helped. Patient thinks it is the antibiotic making her feel this way. Please advise

## 2012-10-20 NOTE — Telephone Encounter (Signed)
Patient states she will stop taking it for now, she has an appointment scheduled with Dr. Luciana Axe for next week, she can be evaluated at that time.

## 2012-10-20 NOTE — Telephone Encounter (Signed)
If not already, taking it with food might help.  If she continues to be intolerant, she could probably just stop since it looked close to being resolved at the last visit and she has had about 3 weeks.  I could see her tomorrow as an overbook for a quick visit to see if she can make it in.  Thanks

## 2012-10-28 ENCOUNTER — Ambulatory Visit (INDEPENDENT_AMBULATORY_CARE_PROVIDER_SITE_OTHER): Payer: BC Managed Care – PPO | Admitting: Internal Medicine

## 2012-10-28 ENCOUNTER — Encounter: Payer: Self-pay | Admitting: Internal Medicine

## 2012-10-28 VITALS — BP 137/80 | HR 69 | Temp 98.1°F | Ht 66.0 in | Wt 270.0 lb

## 2012-10-28 DIAGNOSIS — Z5189 Encounter for other specified aftercare: Secondary | ICD-10-CM

## 2012-10-28 DIAGNOSIS — IMO0001 Reserved for inherently not codable concepts without codable children: Secondary | ICD-10-CM

## 2012-10-30 NOTE — Progress Notes (Signed)
  Subjective:    Patient ID: Heidi [name], female    DOB: 04/01/1956, 57 y.o.   MRN: [id number]  HPI She comes in for follow up of her post op wound infection. She had persistent drainage for several months and multiple antibiotic courses and was started on Iv vanocmycin about 3 weeks ago with a positive culture for MRSA.  She completed 2 weeks of that and transitioned to oral doxycycline based on sensitivities.  She was intolerant of the oral therapy and comes in for follow up off antibiotics.  She has no fever or chills, no further pus or significant drainage.  She feels well and wound is healing nicely.  She tells me she is being scheduled for breast surgery again and is pleased.     Review of Systems  Constitutional: Negative for fever and chills.  Gastrointestinal: Positive for nausea. Negative for vomiting, abdominal pain, diarrhea and constipation.  Skin: Negative for rash.  Neurological: Negative for dizziness.       Objective:   Physical Exam  Skin:  No pus, wound is healing well, no surrounding erythema.          Assessment & Plan:

## 2012-10-30 NOTE — Assessment & Plan Note (Signed)
Healing well and no signs at this time of infection. Follow up PRn.

## 2013-01-11 ENCOUNTER — Encounter (HOSPITAL_BASED_OUTPATIENT_CLINIC_OR_DEPARTMENT_OTHER): Payer: Self-pay | Admitting: *Deleted

## 2013-01-11 NOTE — Progress Notes (Signed)
Pt was here 10/13 for tissue expander in-got infection-had to have iv antibiotics and picc line in-that is out -she had tissue expander out in his office 11/13 Now for TE back in-diabeties better controlled-uses cpap-will bring cpap, all meds, overnight bag in case she has to stay-she did go home 10/13 post op, Will need [employer]

## 2013-01-17 ENCOUNTER — Encounter (HOSPITAL_BASED_OUTPATIENT_CLINIC_OR_DEPARTMENT_OTHER): Payer: Self-pay | Admitting: *Deleted

## 2013-01-17 ENCOUNTER — Encounter (HOSPITAL_BASED_OUTPATIENT_CLINIC_OR_DEPARTMENT_OTHER): Payer: Self-pay | Admitting: Anesthesiology

## 2013-01-17 ENCOUNTER — Ambulatory Visit (HOSPITAL_BASED_OUTPATIENT_CLINIC_OR_DEPARTMENT_OTHER)
Admission: RE | Admit: 2013-01-17 | Discharge: 2013-01-17 | Disposition: A | Discharge status: Home / Self Care | Payer: BC Managed Care – PPO | Source: Ambulatory Visit | Attending: Specialist | Admitting: Specialist

## 2013-01-17 ENCOUNTER — Encounter (HOSPITAL_BASED_OUTPATIENT_CLINIC_OR_DEPARTMENT_OTHER)
Admission: RE | Disposition: A | Discharge status: Home / Self Care | Payer: Self-pay | Source: Ambulatory Visit | Attending: Specialist

## 2013-01-17 ENCOUNTER — Ambulatory Visit (HOSPITAL_BASED_OUTPATIENT_CLINIC_OR_DEPARTMENT_OTHER): Payer: BC Managed Care – PPO | Admitting: Anesthesiology

## 2013-01-17 DIAGNOSIS — Z6841 Body Mass Index (BMI) 40.0 and over, adult: Secondary | ICD-10-CM | POA: Insufficient documentation

## 2013-01-17 DIAGNOSIS — E119 Type 2 diabetes mellitus without complications: Secondary | ICD-10-CM | POA: Insufficient documentation

## 2013-01-17 DIAGNOSIS — F3289 Other specified depressive episodes: Secondary | ICD-10-CM | POA: Insufficient documentation

## 2013-01-17 DIAGNOSIS — J4489 Other specified chronic obstructive pulmonary disease: Secondary | ICD-10-CM | POA: Insufficient documentation

## 2013-01-17 DIAGNOSIS — Z882 Allergy status to sulfonamides status: Secondary | ICD-10-CM | POA: Insufficient documentation

## 2013-01-17 DIAGNOSIS — I10 Essential (primary) hypertension: Secondary | ICD-10-CM | POA: Insufficient documentation

## 2013-01-17 DIAGNOSIS — D649 Anemia, unspecified: Secondary | ICD-10-CM | POA: Insufficient documentation

## 2013-01-17 DIAGNOSIS — F329 Major depressive disorder, single episode, unspecified: Secondary | ICD-10-CM | POA: Insufficient documentation

## 2013-01-17 DIAGNOSIS — N651 Disproportion of reconstructed breast: Secondary | ICD-10-CM | POA: Insufficient documentation

## 2013-01-17 DIAGNOSIS — Z853 Personal history of malignant neoplasm of breast: Secondary | ICD-10-CM | POA: Insufficient documentation

## 2013-01-17 DIAGNOSIS — Z794 Long term (current) use of insulin: Secondary | ICD-10-CM | POA: Insufficient documentation

## 2013-01-17 DIAGNOSIS — Z79899 Other long term (current) drug therapy: Secondary | ICD-10-CM | POA: Insufficient documentation

## 2013-01-17 DIAGNOSIS — M129 Arthropathy, unspecified: Secondary | ICD-10-CM | POA: Insufficient documentation

## 2013-01-17 DIAGNOSIS — K219 Gastro-esophageal reflux disease without esophagitis: Secondary | ICD-10-CM | POA: Insufficient documentation

## 2013-01-17 DIAGNOSIS — G4730 Sleep apnea, unspecified: Secondary | ICD-10-CM | POA: Insufficient documentation

## 2013-01-17 DIAGNOSIS — F411 Generalized anxiety disorder: Secondary | ICD-10-CM | POA: Insufficient documentation

## 2013-01-17 DIAGNOSIS — J449 Chronic obstructive pulmonary disease, unspecified: Secondary | ICD-10-CM | POA: Insufficient documentation

## 2013-01-17 HISTORY — DX: Anemia, unspecified: D64.9

## 2013-01-17 HISTORY — DX: Malignant (primary) neoplasm, unspecified: C80.1

## 2013-01-17 HISTORY — DX: Unspecified osteoarthritis, unspecified site: M19.90

## 2013-01-17 HISTORY — PX: BREAST RECONSTRUCTION: SHX9

## 2013-01-17 LAB — POCT I-STAT, CHEM 8
BUN: 10 mg/dL (ref 6–23)
Calcium, Ion: 1.18 mmol/L (ref 1.12–1.23)
Creatinine, Ser: 0.8 mg/dL (ref 0.50–1.10)
Glucose, Bld: 108 mg/dL — ABNORMAL HIGH (ref 70–99)
Hemoglobin: 10.2 g/dL — ABNORMAL LOW (ref 12.0–15.0)
TCO2: 24 mmol/L (ref 0–100)

## 2013-01-17 SURGERY — RECONSTRUCTION, BREAST
Anesthesia: General | Site: Breast | Laterality: Right | Wound class: Clean

## 2013-01-17 MED ORDER — DEXAMETHASONE SODIUM PHOSPHATE 4 MG/ML IJ SOLN
INTRAMUSCULAR | Status: DC | PRN
  Administered 2013-01-17: 4 mg via INTRAVENOUS

## 2013-01-17 MED ORDER — SODIUM CHLORIDE 0.9 % IR SOLN
Status: DC | PRN
  Administered 2013-01-17: 1

## 2013-01-17 MED ORDER — FENTANYL CITRATE 0.05 MG/ML IJ SOLN: 50.0000 ug | INTRAMUSCULAR | Status: DC | PRN

## 2013-01-17 MED ORDER — GLYCOPYRROLATE 0.2 MG/ML IJ SOLN
INTRAMUSCULAR | Status: DC | PRN
  Administered 2013-01-17: 0.2 mg via INTRAVENOUS
  Administered 2013-01-17: 0.6 mg via INTRAVENOUS

## 2013-01-17 MED ORDER — ROCURONIUM BROMIDE 100 MG/10ML IV SOLN
INTRAVENOUS | Status: DC | PRN
  Administered 2013-01-17: 10 mg via INTRAVENOUS
  Administered 2013-01-17: 5 mg via INTRAVENOUS
  Administered 2013-01-17: 50 mg via INTRAVENOUS

## 2013-01-17 MED ORDER — METOCLOPRAMIDE HCL 5 MG/ML IJ SOLN
INTRAMUSCULAR | Status: DC | PRN
  Administered 2013-01-17: 10 mg via INTRAVENOUS

## 2013-01-17 MED ORDER — LIDOCAINE HCL (CARDIAC) 20 MG/ML IV SOLN
INTRAVENOUS | Status: DC | PRN
  Administered 2013-01-17: 80 mg via INTRAVENOUS

## 2013-01-17 MED ORDER — FENTANYL CITRATE 0.05 MG/ML IJ SOLN
INTRAMUSCULAR | Status: DC | PRN
  Administered 2013-01-17: 100 ug via INTRAVENOUS
  Administered 2013-01-17: 25 ug via INTRAVENOUS

## 2013-01-17 MED ORDER — MIDAZOLAM HCL 5 MG/5ML IJ SOLN
INTRAMUSCULAR | Status: DC | PRN
  Administered 2013-01-17: 2 mg via INTRAVENOUS

## 2013-01-17 MED ORDER — NEOSTIGMINE METHYLSULFATE 1 MG/ML IJ SOLN
INTRAMUSCULAR | Status: DC | PRN
  Administered 2013-01-17: 3 mg via INTRAVENOUS

## 2013-01-17 MED ORDER — CEFAZOLIN SODIUM-DEXTROSE 2-3 GM-% IV SOLR
2.0000 g | INTRAVENOUS | Status: AC
  Administered 2013-01-17: 2 g via INTRAVENOUS

## 2013-01-17 MED ORDER — HYDROMORPHONE HCL PF 1 MG/ML IJ SOLN
0.2500 mg | INTRAMUSCULAR | Status: DC | PRN
  Administered 2013-01-17 (×3): 0.5 mg via INTRAVENOUS

## 2013-01-17 MED ORDER — ONDANSETRON HCL 4 MG/2ML IJ SOLN
INTRAMUSCULAR | Status: DC | PRN
  Administered 2013-01-17: 4 mg via INTRAVENOUS

## 2013-01-17 MED ORDER — METOCLOPRAMIDE HCL 5 MG/ML IJ SOLN: 10.0000 mg | Freq: Once | INTRAMUSCULAR | Status: DC | PRN

## 2013-01-17 MED ORDER — PROPOFOL 10 MG/ML IV BOLUS
INTRAVENOUS | Status: DC | PRN
  Administered 2013-01-17: 250 mg via INTRAVENOUS

## 2013-01-17 MED ORDER — OXYCODONE HCL 5 MG/5ML PO SOLN: 5.0000 mg | Freq: Once | ORAL | Status: AC | PRN

## 2013-01-17 MED ORDER — SCOPOLAMINE 1 MG/3DAYS TD PT72
MEDICATED_PATCH | TRANSDERMAL | Status: DC | PRN
  Administered 2013-01-17: 1 via TRANSDERMAL

## 2013-01-17 MED ORDER — SODIUM CHLORIDE 0.9 % IV SOLN
INTRAVENOUS | Status: DC | PRN
  Administered 2013-01-17: 08:00:00 via INTRAMUSCULAR

## 2013-01-17 MED ORDER — LACTATED RINGERS IV SOLN
INTRAVENOUS | Status: DC
  Administered 2013-01-17: 07:00:00 via INTRAVENOUS

## 2013-01-17 MED ORDER — SODIUM CHLORIDE 0.9 % IV SOLN
INTRAVENOUS | Status: DC | PRN
  Administered 2013-01-17: 08:00:00

## 2013-01-17 MED ORDER — NEOSTIGMINE METHYLSULFATE 1 MG/ML IJ SOLN: INTRAMUSCULAR | Status: DC | PRN

## 2013-01-17 MED ORDER — MIDAZOLAM HCL 2 MG/2ML IJ SOLN: 1.0000 mg | INTRAMUSCULAR | Status: DC | PRN

## 2013-01-17 MED ORDER — OXYCODONE HCL 5 MG PO TABS
5.0000 mg | ORAL_TABLET | Freq: Once | ORAL | Status: AC | PRN
  Administered 2013-01-17: 5 mg via ORAL

## 2013-01-17 SURGICAL SUPPLY — 62 items
BAG DECANTER FOR FLEXI CONT (MISCELLANEOUS) ×2 IMPLANT
BENZOIN TINCTURE PRP APPL 2/3 (GAUZE/BANDAGES/DRESSINGS) ×2 IMPLANT
BINDER BREAST 3XL (BIND) ×2 IMPLANT
BLADE KNIFE PERSONA 10 (BLADE) ×2 IMPLANT
BLADE KNIFE PERSONA 15 (BLADE) ×2 IMPLANT
CANISTER SUCTION 1200CC (MISCELLANEOUS) ×2 IMPLANT
CLOTH BEACON ORANGE TIMEOUT ST (SAFETY) ×2 IMPLANT
COVER MAYO STAND STRL (DRAPES) ×2 IMPLANT
COVER TABLE BACK 60X90 (DRAPES) ×2 IMPLANT
DECANTER SPIKE VIAL GLASS SM (MISCELLANEOUS) ×2 IMPLANT
DRAIN CHANNEL 10M FLAT 3/4 FLT (DRAIN) ×2 IMPLANT
DRAIN PENROSE 1/4X12 LTX STRL (WOUND CARE) IMPLANT
DRAPE LAPAROSCOPIC ABDOMINAL (DRAPES) ×2 IMPLANT
DRSG PAD ABDOMINAL 8X10 ST (GAUZE/BANDAGES/DRESSINGS) ×4 IMPLANT
ELECT BLADE 6.5 .24CM SHAFT (ELECTRODE) ×2 IMPLANT
ELECT REM PT RETURN 9FT ADLT (ELECTROSURGICAL) ×2
ELECTRODE REM PT RTRN 9FT ADLT (ELECTROSURGICAL) ×1 IMPLANT
EVACUATOR SILICONE 100CC (DRAIN) ×2 IMPLANT
FILTER 7/8 IN (FILTER) ×2 IMPLANT
GAUZE XEROFORM 5X9 LF (GAUZE/BANDAGES/DRESSINGS) ×2 IMPLANT
GLOVE BIO SURGEON STRL SZ 6.5 (GLOVE) ×2 IMPLANT
GLOVE BIOGEL M STRL SZ7.5 (GLOVE) ×2 IMPLANT
GLOVE BIOGEL PI IND STRL 8 (GLOVE) ×1 IMPLANT
GLOVE BIOGEL PI INDICATOR 8 (GLOVE) ×1
GLOVE ECLIPSE 7.0 STRL STRAW (GLOVE) ×2 IMPLANT
GOWN PREVENTION PLUS XLARGE (GOWN DISPOSABLE) IMPLANT
GOWN PREVENTION PLUS XXLARGE (GOWN DISPOSABLE) ×4 IMPLANT
IMPL SALINE SMOOTH RND 575 (Breast) ×1 IMPLANT
IMPLANT SALINE SMOOTH RND 575 (Breast) ×2 IMPLANT
IV NS 500ML (IV SOLUTION) ×1
IV NS 500ML BAXH (IV SOLUTION) ×1 IMPLANT
KIT FILL SYSTEM UNIVERSAL (SET/KITS/TRAYS/PACK) ×2 IMPLANT
NEEDLE HYPO 25X1 1.5 SAFETY (NEEDLE) ×2 IMPLANT
NEEDLE SPNL 18GX3.5 QUINCKE PK (NEEDLE) ×2 IMPLANT
NS IRRIG 1000ML POUR BTL (IV SOLUTION) ×2 IMPLANT
PACK BASIN DAY SURGERY FS (CUSTOM PROCEDURE TRAY) ×2 IMPLANT
PEN SKIN MARKING BROAD TIP (MISCELLANEOUS) ×2 IMPLANT
PIN SAFETY STERILE (MISCELLANEOUS) ×2 IMPLANT
SET ASEPTIC TRANSFER (MISCELLANEOUS) ×2 IMPLANT
SLEEVE SCD COMPRESS KNEE MED (MISCELLANEOUS) ×2 IMPLANT
SPONGE GAUZE 4X4 12PLY (GAUZE/BANDAGES/DRESSINGS) ×2 IMPLANT
SPONGE LAP 18X18 X RAY DECT (DISPOSABLE) ×6 IMPLANT
STRIP SUTURE WOUND CLOSURE 1/2 (SUTURE) ×6 IMPLANT
SUT ETHILON 3 0 FSL (SUTURE) ×6 IMPLANT
SUT MNCRL AB 3-0 PS2 18 (SUTURE) ×2 IMPLANT
SUT MON AB 2-0 CT1 36 (SUTURE) ×4 IMPLANT
SUT MON AB 5-0 PS2 18 (SUTURE) IMPLANT
SUT PROLENE 3 0 PS 2 (SUTURE) IMPLANT
SYR 20CC LL (SYRINGE) IMPLANT
SYR 50ML LL SCALE MARK (SYRINGE) ×4 IMPLANT
SYR BULB 3OZ (MISCELLANEOUS) ×2 IMPLANT
SYR BULB IRRIGATION 50ML (SYRINGE) ×2 IMPLANT
SYR CONTROL 10ML LL (SYRINGE) ×2 IMPLANT
TAPE HYPAFIX 6X30 (GAUZE/BANDAGES/DRESSINGS) ×2 IMPLANT
TAPE MEASURE 72IN RETRACT (INSTRUMENTS)
TAPE MEASURE LINEN 72IN RETRCT (INSTRUMENTS) IMPLANT
TOWEL OR 17X24 6PK STRL BLUE (TOWEL DISPOSABLE) ×8 IMPLANT
TRAY DSU PREP LF (CUSTOM PROCEDURE TRAY) ×2 IMPLANT
TUBE CONNECTING 20X1/4 (TUBING) ×2 IMPLANT
UNDERPAD 30X30 INCONTINENT (UNDERPADS AND DIAPERS) ×4 IMPLANT
VAC PENCILS W/TUBING CLEAR (MISCELLANEOUS) ×2 IMPLANT
YANKAUER SUCT BULB TIP NO VENT (SUCTIONS) ×2 IMPLANT

## 2013-01-17 NOTE — Brief Op Note (Signed)
01/17/2013  8:53 AM  PATIENT:  Heidi [name]  57 y.o. female  PRE-OPERATIVE DIAGNOSIS:  CA OF BREAST    POST-OPERATIVE DIAGNOSIS:  CA OF BREAST   PROCEDURE:  Procedure(s): RIGHT BREAST RECONSTRUCTION WITH PLACEMENT OF TISSUE EXPANDER  (Right)  SURGEON:  Surgeon(s) and Role:    * Louisa Second, MD - Primary  PHYSICIAN ASSISTANT:   ASSISTANTS: none   ANESTHESIA:   general  EBL:  Total I/O In: 1000 [I.V.:1000] Out: -   BLOOD ADMINISTERED:none  DRAINS: (JP drain right lateral chest) Jackson-Pratt drain(s) with closed bulb suction in the right breast arearight lateral chest  LOCAL MEDICATIONS USED:  LIDOCAINE   SPECIMEN:  Excision  DISPOSITION OF SPECIMEN:  PATHOLOGY  COUNTS:  YES  TOURNIQUET:  * No tourniquets in log *  DICTATION: .Other Dictation: Dictation Number [id number]  PLAN OF CARE: Discharge to home after PACU  PATIENT DISPOSITION:  PACU - hemodynamically stable.   Delay start of Pharmacological VTE agent (>24hrs) due to surgical blood loss or risk of bleeding: yes

## 2013-01-17 NOTE — H&P (Signed)
Heidi [name] is an 57 y.o. female.   Chief Complaint:Right breast xcancer HPI: hx right breast cancer with mastectomy  Past Medical History  Diagnosis Date  . Anxiety   . Depression   . Hypertension   . Bronchitis     uses ProAir prn  . Diabetes mellitus     IDDM  . GERD (gastroesophageal reflux disease)   . Headache(784.0)   . PONV (postoperative nausea and vomiting)   . Anemia   . Multiple allergies   . Sleep apnea     uses CPAP nightly  . Cancer     breast cancer  . Arthritis     Past Surgical History  Procedure Laterality Date  . Mastectomy  03-2012    Rt  . Shoulder arthroscopy      RT  . Ankle arthroscopy      LT  . Cesarean section    . Breast reconstruction  05/24/2012    Procedure: BREAST RECONSTRUCTION;  Surgeon: Heidi Second, MD;  Location: [facility];  Service: Plastics;  Laterality: Right;  NO FLEX HD  . Tissue expander placement  05/24/2012    Procedure: TISSUE EXPANDER;  Surgeon: Heidi Second, MD;  Location: [facility];  Service: Plastics;  Laterality: Right;  . Tissue expander removal  11/13    right-in dr Heidi [name] office due to infection    History reviewed. No pertinent family history. Social History:  reports that she has never smoked. She has never used smokeless tobacco. She reports that she does not drink alcohol or use illicit drugs.  Allergies:  Allergies  Allergen Reactions  . Sulfa Antibiotics Rash    Medications Prior to Admission  Medication Sig Dispense Refill  . benazepril-hydrochlorthiazide (LOTENSIN HCT) 20-12.5 MG per tablet Take 1 tablet by mouth daily.      . carbamazepine (TEGRETOL) 100 MG chewable tablet Chew 200 mg by mouth 3 (three) times daily.       . clonazePAM (KLONOPIN) 1 MG tablet Take 0.5 mg by mouth at bedtime as needed.       Marland Kitchen dexlansoprazole (DEXILANT) 60 MG capsule Take 60 mg by mouth daily.      . DULoxetine (CYMBALTA) 60 MG capsule Take 60 mg by mouth daily.      .  ferrous gluconate (FERGON) 325 MG tablet Take 325 mg by mouth daily with breakfast.      . fexofenadine-pseudoephedrine (ALLEGRA-D 24) 180-240 MG per 24 hr tablet Take 1 tablet by mouth daily.      . hydrOXYzine (ATARAX/VISTARIL) 25 MG tablet Take 25 mg by mouth every evening.       . insulin glargine (LANTUS) 100 UNIT/ML injection Inject 80 Units into the skin at bedtime.      . insulin lispro protamine-insulin lispro (HUMALOG 75/25) (75-25) 100 UNIT/ML SUSP Inject 7 Units into the skin 2 (two) times daily with a meal.       . oxybutynin (DITROPAN-XL) 10 MG 24 hr tablet Take 10 mg by mouth daily. 5mg       . ranitidine (ZANTAC) 150 MG tablet Take 300 mg by mouth at bedtime.       . topiramate (TOPAMAX) 200 MG tablet Take 100 mg by mouth daily.       . traZODone (DESYREL) 50 MG tablet Take 50 mg by mouth at bedtime.      Marland Kitchen albuterol (PROVENTIL) (2.5 MG/3ML) 0.083% nebulizer solution Take 2.5 mg by nebulization every 6 (six) hours as needed.  Results for orders placed during the hospital encounter of 01/17/13 (from the past 48 hour(s))  POCT I-STAT, CHEM 8     Status: Abnormal   Collection Time    01/17/13  6:58 AM      Result Value Range   Sodium 140  135 - 145 mEq/L   Potassium 3.6  3.5 - 5.1 mEq/L   Chloride 105  96 - 112 mEq/L   BUN 10  6 - 23 mg/dL   Creatinine, Ser 9.81  0.50 - 1.10 mg/dL   Glucose, Bld 191 (*) 70 - 99 mg/dL   Calcium, Ion 4.78  2.95 - 1.23 mmol/L   TCO2 24  0 - 100 mmol/L   Hemoglobin 10.2 (*) 12.0 - 15.0 g/dL   HCT 62.1 (*) 30.8 - 65.7 %   No results found.  Review of Systems  Constitutional: Negative.   HENT: Negative.   Eyes: Negative.   Respiratory: Negative.   Cardiovascular: Negative.   Gastrointestinal: Positive for heartburn.  Genitourinary: Negative.   Musculoskeletal: Negative.   Skin: Positive for rash.  Neurological: Negative.   Endo/Heme/Allergies: Negative.   Psychiatric/Behavioral: Positive for depression.    Blood pressure  137/82, pulse 49, temperature 98.3 F (36.8 C), temperature source Oral, resp. rate 20, height 5\' 6"  (1.676 m), weight 125.306 kg (276 lb 4 oz), last menstrual period 05/20/2012, SpO2 99.00%. Physical Exam   Assessment/Plan Rifgt breast cancer for reconstruction with tissue expander  Heidi [name] L 01/17/2013, 7:26 AM

## 2013-01-17 NOTE — Transfer of Care (Signed)
Immediate Anesthesia Transfer of Care Note  Patient: Heidi [name]  Procedure(s) Performed: Procedure(s): RIGHT BREAST RECONSTRUCTION WITH PLACEMENT OF TISSUE EXPANDER  (Right)  Patient Location: PACU  Anesthesia Type:General  Level of Consciousness: awake, alert  and oriented  Airway & Oxygen Therapy: Patient Spontanous Breathing and Patient connected to face mask oxygen  Post-op Assessment: Report given to PACU RN and Post -op Vital signs reviewed and stable  Post vital signs: Reviewed and stable  Complications: No apparent anesthesia complications

## 2013-01-17 NOTE — Anesthesia Procedure Notes (Signed)
Procedure Name: Intubation Performed by: Kayia Billinger W Pre-anesthesia Checklist: Patient identified, Timeout performed, Emergency Drugs available, Suction available and Patient being monitored Patient Re-evaluated:Patient Re-evaluated prior to inductionOxygen Delivery Method: Circle system utilized Preoxygenation: Pre-oxygenation with 100% oxygen Intubation Type: IV induction Ventilation: Mask ventilation without difficulty Laryngoscope Size: Miller and 2 Grade View: Grade I Tube type: Oral Tube size: 7.0 mm Number of attempts: 1 Airway Equipment and Method: Stylet Placement Confirmation: ETT inserted through vocal cords under direct vision,  breath sounds checked- equal and bilateral and positive ETCO2 Secured at: 22 cm Tube secured with: Tape Dental Injury: Teeth and Oropharynx as per pre-operative assessment      

## 2013-01-17 NOTE — Anesthesia Postprocedure Evaluation (Signed)
Anesthesia Post Note  Patient: Heidi [name]  Procedure(s) Performed: Procedure(s) (LRB): RIGHT BREAST RECONSTRUCTION WITH PLACEMENT OF TISSUE EXPANDER  (Right)  Anesthesia type: General  Patient location: PACU  Post pain: Pain level controlled  Post assessment: Patient's Cardiovascular Status Stable  Last Vitals:  Filed Vitals:   01/17/13 1130  BP: 122/51  Pulse: 61  Temp:   Resp: 13    Post vital signs: Reviewed and stable  Level of consciousness: alert  Complications: No apparent anesthesia complications

## 2013-01-17 NOTE — Anesthesia Preprocedure Evaluation (Signed)
Anesthesia Evaluation  Patient identified by MRN, date of birth, ID band Patient awake    Reviewed: Allergy & Precautions, H&P , NPO status , Patient's Chart, lab work & pertinent test results, reviewed documented beta blocker date and time   History of Anesthesia Complications (+) PONV  Airway Mallampati: II TM Distance: >3 FB Neck ROM: full    Dental   Pulmonary asthma , sleep apnea and Continuous Positive Airway Pressure Ventilation ,  breath sounds clear to auscultation        Cardiovascular hypertension, On Medications negative cardio ROS  Rhythm:regular     Neuro/Psych  Headaches, PSYCHIATRIC DISORDERS negative neurological ROS     GI/Hepatic negative GI ROS, Neg liver ROS, GERD-  Medicated and Controlled,  Endo/Other  diabetes, Insulin DependentMorbid obesity  Renal/GU negative Renal ROS  negative genitourinary   Musculoskeletal   Abdominal   Peds  Hematology negative hematology ROS (+)   Anesthesia Other Findings See surgeon's H&P   Reproductive/Obstetrics negative OB ROS                           Anesthesia Physical Anesthesia Plan  ASA: III  Anesthesia Plan: General   Post-op Pain Management:    Induction: Intravenous  Airway Management Planned: Oral ETT  Additional Equipment:   Intra-op Plan:   Post-operative Plan: Extubation in OR  Informed Consent: I have reviewed the patients History and Physical, chart, labs and discussed the procedure including the risks, benefits and alternatives for the proposed anesthesia with the patient or authorized representative who has indicated his/her understanding and acceptance.   Dental Advisory Given  Plan Discussed with: CRNA and Surgeon  Anesthesia Plan Comments:         Anesthesia Quick Evaluation

## 2013-01-18 ENCOUNTER — Encounter (HOSPITAL_BASED_OUTPATIENT_CLINIC_OR_DEPARTMENT_OTHER): Payer: Self-pay | Admitting: Specialist

## 2013-01-18 NOTE — Op Note (Signed)
[name], [name]               ACCOUNT NO.:  [id number]  MEDICAL RECORD NO.:  [id number]  LOCATION:                                 FACILITY:  PHYSICIAN:  Earvin Hansen L. Tinisha Etzkorn, M.D.DATE OF BIRTH:  1956/05/23  DATE OF PROCEDURE:  01/17/2013 DATE OF DISCHARGE:  01/17/2013                              OPERATIVE REPORT   INDICATIONS FOR PROCEDURE:  This is a 57 year old lady with a history of right breast cancer with mastectomy, had attempts at right tissue expansion several months ago, and the patient had been diabetic and very poor healing capacities, basically had very slow healing and breakdown of the incision sites.  She had been out of control with her diabetes and her compliance with her nutritional status was questionable.  We finally got those areas to heal and she is now been prepared for replantation.  The other tissue expander had to be explanted in the office setting on emergency situations.  Preoperatively, the patient was sat up and drawn for the midline of the chest as well as the inframammary fold and axillary lines were drawn. She then underwent general anesthesia and intubated orally.  Prep was done to the chest and breast areas in routine fashion using Hibiclens soap and solution, walled off with sterile towels and drapes so as to make a sterile field.  Previous very slow healing sites x2 were removed excised and also scar tissue down the underlying pectoralis major muscle.  After proper hemostasis, the wounds were then closed in layers with 2-0 Monocryl x2 layers subcutaneously.  Subdermal suture of 5-0 Monocryl and running subcuticular stitch of 3-0 Monocryl.  The previous inframammary fold incision was entered with a #15 blade, carried down through skin and subcutaneous tissue, I found a border of the pectoralis major serratus anterior.  This was opened and flaps were then maintained and developed subpectorally using light retraction and we were able  to dissect off that area with the Bovie anticoagulation.  Medial, lateral, superior, and inferior dissection was done.  After proper hemostasis and used the Bovie on coagulation.  A mentor tissue expander was placed into this space and another 100 mL of saline injected.  The serial #[id number] mentor 4 and we were able to injected up to 100 mL.  The port was then placed laterally and secured with 3-0 Vicryl.  All the wounds were cleansed.  The wound was drained with a 10 fully fluted Blake drain, which was placed in the depths of wound and brought out through the lateral-most portion of the incision and secured with 3-0 Prolene.  The muscles repaired with 2-0 Monocryl, subcutaneous tissue 2-0 Monocryl x2 layers, and then multiple sutures of 3-0 nylon.  Steri-Strips and soft dressing were applied to all the areas.  Postoperatively she did well. Estimated blood loss is 150 mL.  Complications none.  Blood sugar preoperatively was 108, we will try to maintain that at home with nutritional counseling.  The patient will see me back in the office in approximately 2 days for dressing change.  She is to call for any medical problems.     [name]. [name], M.D.     [name]  D:  01/17/2013  T:  01/18/2013  Job:  [id number]

## 2013-02-22 ENCOUNTER — Encounter (HOSPITAL_BASED_OUTPATIENT_CLINIC_OR_DEPARTMENT_OTHER): Payer: Self-pay | Admitting: *Deleted

## 2013-02-28 ENCOUNTER — Encounter (HOSPITAL_BASED_OUTPATIENT_CLINIC_OR_DEPARTMENT_OTHER): Payer: Self-pay | Admitting: *Deleted

## 2013-02-28 ENCOUNTER — Encounter (HOSPITAL_BASED_OUTPATIENT_CLINIC_OR_DEPARTMENT_OTHER)
Admission: RE | Disposition: A | Discharge status: Home / Self Care | Payer: Self-pay | Source: Ambulatory Visit | Attending: Specialist

## 2013-02-28 ENCOUNTER — Ambulatory Visit (HOSPITAL_BASED_OUTPATIENT_CLINIC_OR_DEPARTMENT_OTHER)
Admission: RE | Admit: 2013-02-28 | Discharge: 2013-02-28 | Disposition: A | Discharge status: Home / Self Care | Payer: BC Managed Care – PPO | Source: Ambulatory Visit | Attending: Specialist | Admitting: Specialist

## 2013-02-28 ENCOUNTER — Ambulatory Visit (HOSPITAL_BASED_OUTPATIENT_CLINIC_OR_DEPARTMENT_OTHER): Payer: BC Managed Care – PPO | Admitting: Anesthesiology

## 2013-02-28 ENCOUNTER — Encounter (HOSPITAL_BASED_OUTPATIENT_CLINIC_OR_DEPARTMENT_OTHER): Payer: Self-pay | Admitting: Anesthesiology

## 2013-02-28 DIAGNOSIS — K219 Gastro-esophageal reflux disease without esophagitis: Secondary | ICD-10-CM | POA: Insufficient documentation

## 2013-02-28 DIAGNOSIS — F3289 Other specified depressive episodes: Secondary | ICD-10-CM | POA: Insufficient documentation

## 2013-02-28 DIAGNOSIS — M129 Arthropathy, unspecified: Secondary | ICD-10-CM | POA: Insufficient documentation

## 2013-02-28 DIAGNOSIS — N62 Hypertrophy of breast: Secondary | ICD-10-CM | POA: Insufficient documentation

## 2013-02-28 DIAGNOSIS — F411 Generalized anxiety disorder: Secondary | ICD-10-CM | POA: Insufficient documentation

## 2013-02-28 DIAGNOSIS — J45909 Unspecified asthma, uncomplicated: Secondary | ICD-10-CM | POA: Insufficient documentation

## 2013-02-28 DIAGNOSIS — Z9010 Acquired absence of unspecified breast and nipple: Secondary | ICD-10-CM | POA: Insufficient documentation

## 2013-02-28 DIAGNOSIS — F329 Major depressive disorder, single episode, unspecified: Secondary | ICD-10-CM | POA: Insufficient documentation

## 2013-02-28 DIAGNOSIS — G4730 Sleep apnea, unspecified: Secondary | ICD-10-CM | POA: Insufficient documentation

## 2013-02-28 DIAGNOSIS — Z853 Personal history of malignant neoplasm of breast: Secondary | ICD-10-CM | POA: Insufficient documentation

## 2013-02-28 DIAGNOSIS — I10 Essential (primary) hypertension: Secondary | ICD-10-CM | POA: Insufficient documentation

## 2013-02-28 DIAGNOSIS — E119 Type 2 diabetes mellitus without complications: Secondary | ICD-10-CM | POA: Insufficient documentation

## 2013-02-28 HISTORY — DX: Other allergy status, other than to drugs and biological substances: Z91.09

## 2013-02-28 HISTORY — PX: BREAST REDUCTION SURGERY: SHX8

## 2013-02-28 HISTORY — DX: Unspecified asthma, uncomplicated: J45.909

## 2013-02-28 HISTORY — DX: Overactive bladder: N32.81

## 2013-02-28 HISTORY — DX: Personal history of Methicillin resistant Staphylococcus aureus infection: Z86.14

## 2013-02-28 LAB — POCT I-STAT, CHEM 8
BUN: 10 mg/dL (ref 6–23)
Calcium, Ion: 1.13 mmol/L (ref 1.12–1.23)
Creatinine, Ser: 0.9 mg/dL (ref 0.50–1.10)
Hemoglobin: 10.2 g/dL — ABNORMAL LOW (ref 12.0–15.0)
Sodium: 137 mEq/L (ref 135–145)
TCO2: 24 mmol/L (ref 0–100)

## 2013-02-28 LAB — GLUCOSE, CAPILLARY

## 2013-02-28 SURGERY — MAMMOPLASTY, REDUCTION
Anesthesia: General | Site: Breast | Laterality: Left | Wound class: Clean

## 2013-02-28 MED ORDER — FENTANYL CITRATE 0.05 MG/ML IJ SOLN: 50.0000 ug | INTRAMUSCULAR | Status: DC | PRN

## 2013-02-28 MED ORDER — LIDOCAINE HCL (CARDIAC) 20 MG/ML IV SOLN
INTRAVENOUS | Status: DC | PRN
  Administered 2013-02-28: 100 mg via INTRAVENOUS

## 2013-02-28 MED ORDER — FENTANYL CITRATE 0.05 MG/ML IJ SOLN
25.0000 ug | INTRAMUSCULAR | Status: DC | PRN
  Administered 2013-02-28: 25 ug via INTRAVENOUS
  Administered 2013-02-28: 50 ug via INTRAVENOUS

## 2013-02-28 MED ORDER — SODIUM CHLORIDE 0.9 % IV SOLN
INTRAVENOUS | Status: DC | PRN
  Administered 2013-02-28: 500 mL via INTRAMUSCULAR

## 2013-02-28 MED ORDER — DEXAMETHASONE SODIUM PHOSPHATE 4 MG/ML IJ SOLN
INTRAMUSCULAR | Status: DC | PRN
  Administered 2013-02-28: 10 mg via INTRAVENOUS

## 2013-02-28 MED ORDER — OXYCODONE HCL 5 MG PO TABS: 5.0000 mg | ORAL_TABLET | Freq: Once | ORAL | Status: DC | PRN

## 2013-02-28 MED ORDER — GLYCOPYRROLATE 0.2 MG/ML IJ SOLN
INTRAMUSCULAR | Status: DC | PRN
  Administered 2013-02-28: 0.2 mg via INTRAVENOUS

## 2013-02-28 MED ORDER — FENTANYL CITRATE 0.05 MG/ML IJ SOLN
INTRAMUSCULAR | Status: DC | PRN
  Administered 2013-02-28: 50 ug via INTRAVENOUS
  Administered 2013-02-28: 100 ug via INTRAVENOUS

## 2013-02-28 MED ORDER — SCOPOLAMINE 1 MG/3DAYS TD PT72
1.0000 | MEDICATED_PATCH | TRANSDERMAL | Status: DC
  Administered 2013-02-28: 1.5 mg via TRANSDERMAL

## 2013-02-28 MED ORDER — MIDAZOLAM HCL 2 MG/2ML IJ SOLN: 1.0000 mg | INTRAMUSCULAR | Status: DC | PRN

## 2013-02-28 MED ORDER — MIDAZOLAM HCL 2 MG/ML PO SYRP: 12.0000 mg | ORAL_SOLUTION | Freq: Once | ORAL | Status: DC | PRN

## 2013-02-28 MED ORDER — LACTATED RINGERS IV SOLN
INTRAVENOUS | Status: DC
  Administered 2013-02-28: 07:00:00 via INTRAVENOUS

## 2013-02-28 MED ORDER — OXYCODONE HCL 5 MG/5ML PO SOLN: 5.0000 mg | Freq: Once | ORAL | Status: DC | PRN

## 2013-02-28 MED ORDER — PROPOFOL 10 MG/ML IV BOLUS
INTRAVENOUS | Status: DC | PRN
  Administered 2013-02-28: 200 mg via INTRAVENOUS

## 2013-02-28 MED ORDER — LIDOCAINE-EPINEPHRINE 0.5 %-1:200000 IJ SOLN
INTRAMUSCULAR | Status: DC | PRN
  Administered 2013-02-28: 50 mL

## 2013-02-28 MED ORDER — MIDAZOLAM HCL 5 MG/5ML IJ SOLN
INTRAMUSCULAR | Status: DC | PRN
  Administered 2013-02-28: 2 mg via INTRAVENOUS

## 2013-02-28 MED ORDER — ONDANSETRON HCL 4 MG/2ML IJ SOLN: 4.0000 mg | Freq: Four times a day (QID) | INTRAMUSCULAR | Status: DC | PRN

## 2013-02-28 MED ORDER — ONDANSETRON HCL 4 MG/2ML IJ SOLN
INTRAMUSCULAR | Status: DC | PRN
  Administered 2013-02-28: 4 mg via INTRAVENOUS

## 2013-02-28 MED ORDER — CEFAZOLIN SODIUM-DEXTROSE 2-3 GM-% IV SOLR
2.0000 g | INTRAVENOUS | Status: AC
  Administered 2013-02-28: 2 g via INTRAVENOUS

## 2013-02-28 SURGICAL SUPPLY — 61 items
BAG DECANTER FOR FLEXI CONT (MISCELLANEOUS) ×2 IMPLANT
BENZOIN TINCTURE PRP APPL 2/3 (GAUZE/BANDAGES/DRESSINGS) ×4 IMPLANT
BINDER BREAST 3XL (BIND) ×2 IMPLANT
BINDER BREAST XLRG (GAUZE/BANDAGES/DRESSINGS) IMPLANT
BINDER BREAST XXLRG (GAUZE/BANDAGES/DRESSINGS) ×4 IMPLANT
BLADE KNIFE  20 PERSONNA (BLADE) ×2
BLADE KNIFE 20 PERSONNA (BLADE) ×2 IMPLANT
BLADE KNIFE PERSONA 15 (BLADE) ×2 IMPLANT
CANISTER SUCTION 1200CC (MISCELLANEOUS) ×2 IMPLANT
CLOTH BEACON ORANGE TIMEOUT ST (SAFETY) ×2 IMPLANT
COVER MAYO STAND STRL (DRAPES) ×2 IMPLANT
COVER TABLE BACK 60X90 (DRAPES) ×2 IMPLANT
DECANTER SPIKE VIAL GLASS SM (MISCELLANEOUS) ×4 IMPLANT
DRAIN CHANNEL 10F 3/8 F FF (DRAIN) ×2 IMPLANT
DRAPE LAPAROSCOPIC ABDOMINAL (DRAPES) ×2 IMPLANT
DRAPE UTILITY XL STRL (DRAPES) ×2 IMPLANT
DRSG PAD ABDOMINAL 8X10 ST (GAUZE/BANDAGES/DRESSINGS) ×4 IMPLANT
ELECT REM PT RETURN 9FT ADLT (ELECTROSURGICAL) ×2
ELECTRODE REM PT RTRN 9FT ADLT (ELECTROSURGICAL) ×1 IMPLANT
EVACUATOR SILICONE 100CC (DRAIN) ×2 IMPLANT
FILTER 7/8 IN (FILTER) ×2 IMPLANT
GAUZE XEROFORM 5X9 LF (GAUZE/BANDAGES/DRESSINGS) ×2 IMPLANT
GLOVE BIO SURGEON STRL SZ 6.5 (GLOVE) ×2 IMPLANT
GLOVE BIOGEL M STRL SZ7.5 (GLOVE) ×2 IMPLANT
GLOVE BIOGEL PI IND STRL 8 (GLOVE) ×1 IMPLANT
GLOVE BIOGEL PI INDICATOR 8 (GLOVE) ×1
GLOVE ECLIPSE 7.0 STRL STRAW (GLOVE) ×2 IMPLANT
GLOVE EXAM NITRILE EXT CUFF MD (GLOVE) ×2 IMPLANT
GLOVE SURG SS PI 7.0 STRL IVOR (GLOVE) ×4 IMPLANT
GOWN PREVENTION PLUS XXLARGE (GOWN DISPOSABLE) ×4 IMPLANT
IV NS 500ML (IV SOLUTION) ×1
IV NS 500ML BAXH (IV SOLUTION) ×1 IMPLANT
NEEDLE SPNL 18GX3.5 QUINCKE PK (NEEDLE) ×2 IMPLANT
NS IRRIG 1000ML POUR BTL (IV SOLUTION) IMPLANT
PACK BASIN DAY SURGERY FS (CUSTOM PROCEDURE TRAY) ×2 IMPLANT
PEN SKIN MARKING BROAD TIP (MISCELLANEOUS) ×4 IMPLANT
PILLOW FOAM RUBBER ADULT (PILLOWS) ×2 IMPLANT
PIN SAFETY STERILE (MISCELLANEOUS) ×2 IMPLANT
SHEETING SILICONE GEL EPI DERM (MISCELLANEOUS) IMPLANT
SLEEVE SCD COMPRESS KNEE MED (MISCELLANEOUS) ×2 IMPLANT
SLEEVE SURGEON STRL (DRAPES) ×2 IMPLANT
SPECIMEN JAR MEDIUM (MISCELLANEOUS) ×2 IMPLANT
SPECIMEN JAR X LARGE (MISCELLANEOUS) IMPLANT
SPONGE GAUZE 4X4 12PLY (GAUZE/BANDAGES/DRESSINGS) ×4 IMPLANT
SPONGE LAP 18X18 X RAY DECT (DISPOSABLE) ×8 IMPLANT
STRIP SUTURE WOUND CLOSURE 1/2 (SUTURE) ×6 IMPLANT
SUT MNCRL AB 3-0 PS2 18 (SUTURE) ×6 IMPLANT
SUT MON AB 2-0 CT1 36 (SUTURE) IMPLANT
SUT MON AB 5-0 PS2 18 (SUTURE) ×2 IMPLANT
SUT PROLENE 3 0 PS 2 (SUTURE) ×6 IMPLANT
SYR 50ML LL SCALE MARK (SYRINGE) ×4 IMPLANT
SYR CONTROL 10ML LL (SYRINGE) IMPLANT
TAPE HYPAFIX 6X30 (GAUZE/BANDAGES/DRESSINGS) ×2 IMPLANT
TAPE MEASURE 72IN RETRACT (INSTRUMENTS) ×1
TAPE MEASURE LINEN 72IN RETRCT (INSTRUMENTS) ×1 IMPLANT
TAPE PAPER MEDFIX 1IN X 10YD (GAUZE/BANDAGES/DRESSINGS) ×2 IMPLANT
TOWEL OR NON WOVEN STRL DISP B (DISPOSABLE) ×2 IMPLANT
TUBE CONNECTING 20X1/4 (TUBING) ×2 IMPLANT
UNDERPAD 30X30 INCONTINENT (UNDERPADS AND DIAPERS) ×6 IMPLANT
VAC PENCILS W/TUBING CLEAR (MISCELLANEOUS) ×2 IMPLANT
YANKAUER SUCT BULB TIP NO VENT (SUCTIONS) ×2 IMPLANT

## 2013-02-28 NOTE — Anesthesia Preprocedure Evaluation (Signed)
Anesthesia Evaluation  Patient identified by MRN, date of birth, ID band Patient awake    Reviewed: Allergy & Precautions, H&P , NPO status , Patient's Chart, lab work & pertinent test results  History of Anesthesia Complications (+) PONV  Airway Mallampati: II  Neck ROM: full    Dental   Pulmonary asthma , sleep apnea ,          Cardiovascular hypertension,     Neuro/Psych  Headaches, Anxiety Depression  Neuromuscular disease    GI/Hepatic GERD-  ,  Endo/Other  diabetes, Type obesity  Renal/GU      Musculoskeletal   Abdominal   Peds  Hematology   Anesthesia Other Findings   Reproductive/Obstetrics                           Anesthesia Physical Anesthesia Plan  ASA: III  Anesthesia Plan: General   Post-op Pain Management:    Induction: Intravenous  Airway Management Planned: LMA  Additional Equipment:   Intra-op Plan:   Post-operative Plan:   Informed Consent: I have reviewed the patients History and Physical, chart, labs and discussed the procedure including the risks, benefits and alternatives for the proposed anesthesia with the patient or authorized representative who has indicated his/her understanding and acceptance.     Plan Discussed with: CRNA, Anesthesiologist and Surgeon  Anesthesia Plan Comments:         Anesthesia Quick Evaluation

## 2013-02-28 NOTE — Anesthesia Postprocedure Evaluation (Signed)
Anesthesia Post Note  Patient: Heidi [name]  Procedure(s) Performed: Procedure(s) (LRB): LEFT BREAST REDUCTION  (BREAST) (Left)  Anesthesia type: General  Patient location: PACU  Post pain: Pain level controlled and Adequate analgesia  Post assessment: Post-op Vital signs reviewed, Patient's Cardiovascular Status Stable, Respiratory Function Stable, Patent Airway and Pain level controlled  Last Vitals:  Filed Vitals:   02/28/13 1030  BP:   Pulse: 56  Temp:   Resp: 11    Post vital signs: Reviewed and stable  Level of consciousness: awake, alert  and oriented  Complications: No apparent anesthesia complications

## 2013-02-28 NOTE — H&P (Signed)
Heidi [name] is an 57 y.o. female.   Chief Complaint: Left macromastia HPI: Increased back and shoulder pain sp right mastectomy for cancer  Past Medical History  Diagnosis Date  . Anxiety   . Depression   . Diabetes mellitus     IDDM  . GERD (gastroesophageal reflux disease)   . PONV (postoperative nausea and vomiting)   . Arthritis   . Headache(784.0)     migraines  . Asthma     prn neb.  . Environmental allergies   . Hypertension     under control with med., has been on med. x 10-15 yr.  . Sleep apnea     uses CPAP nightly  . Cancer     right breast cancer  . Anemia     takes iron supplement  . History of MRSA infection 08/2012    right breast  . Overactive bladder     Past Surgical History  Procedure Laterality Date  . Mastectomy Right 03/2012  . Shoulder arthroscopy Right   . Ankle arthroscopy Left   . Cesarean section    . Breast reconstruction  05/24/2012    Procedure: BREAST RECONSTRUCTION;  Surgeon: Louisa Second, MD;  Location: [facility];  Service: Plastics;  Laterality: Right;  NO FLEX HD  . Tissue expander placement  05/24/2012    Procedure: TISSUE EXPANDER;  Surgeon: Louisa Second, MD;  Location: [facility];  Service: Plastics;  Laterality: Right;  . Breast reconstruction Right 01/17/2013    Procedure: RIGHT BREAST RECONSTRUCTION WITH PLACEMENT OF TISSUE EXPANDER ;  Surgeon: Louisa Second, MD;  Location: [facility];  Service: Plastics;  Laterality: Right;    History reviewed. No pertinent family history. Social History:  reports that she has never smoked. She has never used smokeless tobacco. She reports that she does not drink alcohol or use illicit drugs.  Allergies:  Allergies  Allergen Reactions  . Soap Other (See Comments)    FRAGRANCES CAUSE MIGRAINES  . Latex Itching  . Sulfa Antibiotics Rash    Medications Prior to Admission  Medication Sig Dispense Refill  .  benazepril-hydrochlorthiazide (LOTENSIN HCT) 20-12.5 MG per tablet Take 1 tablet by mouth daily.      . carbamazepine (TEGRETOL) 100 MG chewable tablet Chew 100 mg by mouth daily.       . clonazePAM (KLONOPIN) 1 MG tablet Take 0.5 mg by mouth at bedtime as needed.       Marland Kitchen dexlansoprazole (DEXILANT) 60 MG capsule Take 60 mg by mouth 2 (two) times daily.       . DULoxetine (CYMBALTA) 60 MG capsule Take 60 mg by mouth daily.      . ferrous gluconate (FERGON) 325 MG tablet Take 325 mg by mouth daily with breakfast.      . fexofenadine-pseudoephedrine (ALLEGRA-D 24) 180-240 MG per 24 hr tablet Take 1 tablet by mouth daily.      . hydrOXYzine (ATARAX/VISTARIL) 25 MG tablet Take 25 mg by mouth every evening.       . insulin glargine (LANTUS) 100 UNIT/ML injection Inject 80 Units into the skin at bedtime.      . insulin lispro protamine-insulin lispro (HUMALOG 75/25) (75-25) 100 UNIT/ML SUSP Inject 7 Units into the skin 2 (two) times daily with a meal.       . oxybutynin (DITROPAN-XL) 10 MG 24 hr tablet Take 10 mg by mouth 2 (two) times daily. 5mg       . ranitidine (ZANTAC) 150  MG tablet Take 150 mg by mouth at bedtime.       . topiramate (TOPAMAX) 200 MG tablet Take 100 mg by mouth daily.       . traZODone (DESYREL) 50 MG tablet Take 50 mg by mouth at bedtime.      Marland Kitchen albuterol (PROVENTIL) (2.5 MG/3ML) 0.083% nebulizer solution Take 2.5 mg by nebulization every 6 (six) hours as needed.        Results for orders placed during the hospital encounter of 02/28/13 (from the past 48 hour(s))  POCT I-STAT, CHEM 8     Status: Abnormal   Collection Time    02/28/13  6:49 AM      Result Value Range   Sodium 137  135 - 145 mEq/L   Potassium 3.8  3.5 - 5.1 mEq/L   Chloride 102  96 - 112 mEq/L   BUN 10  6 - 23 mg/dL   Creatinine, Ser 4.54  0.50 - 1.10 mg/dL   Glucose, Bld 89  70 - 99 mg/dL   Calcium, Ion 0.98  1.19 - 1.23 mmol/L   TCO2 24  0 - 100 mmol/L   Hemoglobin 10.2 (*) 12.0 - 15.0 g/dL   HCT 14.7  (*) 82.9 - 46.0 %   No results found.  Review of Systems  Constitutional: Negative.   HENT: Positive for neck pain.   Eyes: Negative.   Respiratory: Negative.   Cardiovascular: Negative.   Gastrointestinal: Negative.   Genitourinary: Negative.   Musculoskeletal: Positive for back pain.  Skin: Positive for rash.  Neurological: Negative.   Endo/Heme/Allergies: Negative.   Psychiatric/Behavioral: The patient is nervous/anxious.     Blood pressure 134/77, pulse 53, temperature 98.3 F (36.8 C), temperature source Oral, resp. rate 20, height 5\' 6"  (1.676 m), weight 128.368 kg (283 lb), last menstrual period 05/20/2012, SpO2 99.00%. Physical Exam   Assessment/PlanLeft macromastia for left breast reduction  Heidi [name] L 02/28/2013, 7:32 AM

## 2013-02-28 NOTE — Brief Op Note (Signed)
02/28/2013  9:02 AM  PATIENT:  Heidi [name]  57 y.o. female  PRE-OPERATIVE DIAGNOSIS:  left breast macromastia  POST-OPERATIVE DIAGNOSIS:  left breast macromastia  PROCEDURE:  Procedure(s): LEFT BREAST REDUCTION  (BREAST) (Left)  SURGEON:  Surgeon(s) and Role:    * Louisa Second, MD - Primary  PHYSICIAN ASSISTANT:   ASSISTANTS: none   ANESTHESIA:   general  EBL:  Total I/O In: 1000 [I.V.:1000] Out: -   BLOOD ADMINISTERED:none  DRAINS: (left lateral chest area) Jackson-Pratt drain(s) with closed bulb suction in the left lateral chest area   LOCAL MEDICATIONS USED:  LIDOCAINE   SPECIMEN:  Excision  DISPOSITION OF SPECIMEN:  PATHOLOGY  COUNTS:  YES  TOURNIQUET:  * No tourniquets in log *  DICTATION: .Other Dictation: Dictation Number [id number]  PLAN OF CARE: Discharge to home after PACU  PATIENT DISPOSITION:  PACU - hemodynamically stable.   Delay start of Pharmacological VTE agent (>24hrs) due to surgical blood loss or risk of bleeding: yes

## 2013-02-28 NOTE — Transfer of Care (Signed)
Immediate Anesthesia Transfer of Care Note  Patient: Heidi [name]  Procedure(s) Performed: Procedure(s): LEFT BREAST REDUCTION  (BREAST) (Left)  Patient Location: PACU  Anesthesia Type:General  Level of Consciousness: awake, alert  and oriented  Airway & Oxygen Therapy: Patient Spontanous Breathing and Patient connected to face mask oxygen  Post-op Assessment: Report given to PACU RN and Post -op Vital signs reviewed and stable  Post vital signs: Reviewed and stable  Complications: No apparent anesthesia complications

## 2013-02-28 NOTE — Anesthesia Procedure Notes (Signed)
Procedure Name: LMA Insertion Performed by: Kamica Florance W Pre-anesthesia Checklist: Patient identified, Timeout performed, Emergency Drugs available, Suction available and Patient being monitored Patient Re-evaluated:Patient Re-evaluated prior to inductionOxygen Delivery Method: Circle system utilized Preoxygenation: Pre-oxygenation with 100% oxygen Intubation Type: IV induction Ventilation: Mask ventilation without difficulty LMA: LMA with gastric port inserted LMA Size: 4.0 Number of attempts: 1 Placement Confirmation: breath sounds checked- equal and bilateral and positive ETCO2 Tube secured with: Tape Dental Injury: Teeth and Oropharynx as per pre-operative assessment      

## 2013-03-01 ENCOUNTER — Encounter (HOSPITAL_BASED_OUTPATIENT_CLINIC_OR_DEPARTMENT_OTHER): Payer: Self-pay | Admitting: Specialist

## 2013-03-01 NOTE — Op Note (Signed)
NAME:  Heidi [name], Heidi [name]            ACCOUNT NO.:  [id number]  MEDICAL RECORD NO.:  [id number]  LOCATION:                               FACILITY:  [facility]  PHYSICIAN:  [name]. [name], M.D.DATE OF BIRTH:  1956-06-03  DATE OF PROCEDURE:  02/28/2013 DATE OF DISCHARGE:  02/28/2013                              OPERATIVE REPORT   HISTORY:  This is a 57 year old lady with severe macromastia on the left side.  She has had a previous right breast reconstruction from mastectomy and breast cancer.  She is now having the left side causing increased pain and discomfort, is over triple D to E size as well as for symmetry.  All procedures in detail as well as attendant risks were explained to the patient preoperatively.  She has a history of MRSA and heals very slowly, and we are going to be very precautious on her in terms of hopefully having no infectious processes in this procedure.  DETAILS OF PROCEDURE PERFORMED:  Preoperatively, she was sat up and drawn for the reduction mammoplasty.  She wants to be less than a C- B+cup.  She had the drawings done and was then taken to surgery.  She was intubated.  Prep was done to the chest and breast areas in routine fashion using Hibiclens soap and solution walled off with sterile towels and drapes so as to make a sterile field.  0.5% Xylocaine with epinephrine injected locally to the left breast area, total 150 mL.  We allowed the areas to settle down and vasoconstrict and then the wounds were scored with #15 blade.  The skin of the inferior pedicles was de- epithelialized with #20 blade.  The medial and lateral fatty dermal pedicles were incised down the fascia and after proper hemostasis, the new keyhole area and pedicle was debrided, and divided and shaped. After proper hemostasis, the flaps were transposed and stayed with 3-0 Prolene suture.  Subcutaneous closure was done with 3-0 Monocryl x2 layers and then running subcuticular stitch of 2-0  Monocryl 3-0 Monocryl, 5-0 Monocryl throughout the inverted T.  The wounds were drained with #15.  A #10 Blake drain was placed in depths of wound and brought to the lateral-most portion incision on the left side.  It was secured with 3-0 Prolene.  The wounds were cleansed.  Steri-Strips and soft dressing was applied to all the areas, 4 x 4's, ABDs Hypafix tape. At the end of the procedure, the nipple-areolar complexes were examined showing excellent suppleness and skin color.  She was then dressed and taken to recovery in excellent condition.  ESTIMATED BLOOD LOSS:  Less than 100 mL.  COMPLICATIONS:  None.     [name]. [name], M.D.     [name]  D:  02/28/2013  T:  02/28/2013  Job:  [id number]

## 2013-05-09 ENCOUNTER — Encounter (HOSPITAL_BASED_OUTPATIENT_CLINIC_OR_DEPARTMENT_OTHER): Payer: Self-pay | Admitting: *Deleted

## 2013-05-09 NOTE — Progress Notes (Signed)
Pt had surgery here 6/14 and 7/14-her tissue expander ruptured and she is still in dr Irven Coe office 440pm 05/09/13-talked to her-surgery in am-will need istat-bring all meds and new insulin-and cpap-overnight bag in case she has to stay-she has gone home last 2 surgeries.

## 2013-05-10 ENCOUNTER — Encounter (HOSPITAL_BASED_OUTPATIENT_CLINIC_OR_DEPARTMENT_OTHER)
Admission: RE | Disposition: A | Discharge status: Home / Self Care | Payer: Self-pay | Source: Ambulatory Visit | Attending: Specialist

## 2013-05-10 ENCOUNTER — Encounter (HOSPITAL_BASED_OUTPATIENT_CLINIC_OR_DEPARTMENT_OTHER): Payer: Self-pay | Admitting: *Deleted

## 2013-05-10 ENCOUNTER — Ambulatory Visit (HOSPITAL_BASED_OUTPATIENT_CLINIC_OR_DEPARTMENT_OTHER): Payer: BC Managed Care – PPO | Admitting: *Deleted

## 2013-05-10 ENCOUNTER — Ambulatory Visit (HOSPITAL_BASED_OUTPATIENT_CLINIC_OR_DEPARTMENT_OTHER)
Admission: RE | Admit: 2013-05-10 | Discharge: 2013-05-10 | Disposition: A | Discharge status: Home / Self Care | Payer: BC Managed Care – PPO | Source: Ambulatory Visit | Attending: Specialist | Admitting: Specialist

## 2013-05-10 DIAGNOSIS — T85898A Other specified complication of other internal prosthetic devices, implants and grafts, initial encounter: Secondary | ICD-10-CM | POA: Insufficient documentation

## 2013-05-10 DIAGNOSIS — Y831 Surgical operation with implant of artificial internal device as the cause of abnormal reaction of the patient, or of later complication, without mention of misadventure at the time of the procedure: Secondary | ICD-10-CM | POA: Insufficient documentation

## 2013-05-10 DIAGNOSIS — Z853 Personal history of malignant neoplasm of breast: Secondary | ICD-10-CM | POA: Insufficient documentation

## 2013-05-10 HISTORY — PX: REMOVAL OF TISSUE EXPANDER AND PLACEMENT OF IMPLANT: SHX6457

## 2013-05-10 LAB — POCT I-STAT, CHEM 8
Calcium, Ion: 1.18 mmol/L (ref 1.12–1.23)
Glucose, Bld: 128 mg/dL — ABNORMAL HIGH (ref 70–99)
HCT: 30 % — ABNORMAL LOW (ref 36.0–46.0)
Potassium: 4 mEq/L (ref 3.5–5.1)
TCO2: 23 mmol/L (ref 0–100)

## 2013-05-10 LAB — GLUCOSE, CAPILLARY: Glucose-Capillary: 163 mg/dL — ABNORMAL HIGH (ref 70–99)

## 2013-05-10 SURGERY — REMOVAL, TISSUE EXPANDER, BREAST, WITH IMPLANT INSERTION
Anesthesia: General | Site: Breast | Laterality: Right | Wound class: Dirty or Infected

## 2013-05-10 MED ORDER — SUCCINYLCHOLINE CHLORIDE 20 MG/ML IJ SOLN
INTRAMUSCULAR | Status: DC | PRN
  Administered 2013-05-10: 140 mg via INTRAVENOUS

## 2013-05-10 MED ORDER — OXYCODONE HCL 5 MG/5ML PO SOLN: 5.0000 mg | Freq: Once | ORAL | Status: AC | PRN

## 2013-05-10 MED ORDER — CEFAZOLIN SODIUM-DEXTROSE 2-3 GM-% IV SOLR
2.0000 g | INTRAVENOUS | Status: AC
  Administered 2013-05-10: 2 g via INTRAVENOUS

## 2013-05-10 MED ORDER — SCOPOLAMINE 1 MG/3DAYS TD PT72
1.0000 | MEDICATED_PATCH | TRANSDERMAL | Status: DC
  Administered 2013-05-10: 1.5 mg via TRANSDERMAL

## 2013-05-10 MED ORDER — FENTANYL CITRATE 0.05 MG/ML IJ SOLN
INTRAMUSCULAR | Status: DC | PRN
  Administered 2013-05-10: 100 ug via INTRAVENOUS

## 2013-05-10 MED ORDER — EPHEDRINE SULFATE 50 MG/ML IJ SOLN
INTRAMUSCULAR | Status: DC | PRN
  Administered 2013-05-10 (×3): 10 mg via INTRAVENOUS

## 2013-05-10 MED ORDER — HYDROMORPHONE HCL PF 1 MG/ML IJ SOLN
0.2500 mg | INTRAMUSCULAR | Status: DC | PRN
  Administered 2013-05-10 (×2): 0.5 mg via INTRAVENOUS

## 2013-05-10 MED ORDER — GLYCOPYRROLATE 0.2 MG/ML IJ SOLN
INTRAMUSCULAR | Status: DC | PRN
  Administered 2013-05-10: .5 mg via INTRAVENOUS
  Administered 2013-05-10: 0.2 mg via INTRAVENOUS

## 2013-05-10 MED ORDER — MIDAZOLAM HCL 2 MG/2ML IJ SOLN: 0.5000 mg | Freq: Once | INTRAMUSCULAR | Status: DC | PRN

## 2013-05-10 MED ORDER — LIDOCAINE HCL (CARDIAC) 20 MG/ML IV SOLN
INTRAVENOUS | Status: DC | PRN
  Administered 2013-05-10: 40 mg via INTRAVENOUS

## 2013-05-10 MED ORDER — PROMETHAZINE HCL 25 MG/ML IJ SOLN: 6.2500 mg | INTRAMUSCULAR | Status: DC | PRN

## 2013-05-10 MED ORDER — SCOPOLAMINE 1 MG/3DAYS TD PT72: 1.0000 | MEDICATED_PATCH | TRANSDERMAL | Status: DC

## 2013-05-10 MED ORDER — MEPERIDINE HCL 25 MG/ML IJ SOLN: 6.2500 mg | INTRAMUSCULAR | Status: DC | PRN

## 2013-05-10 MED ORDER — OXYCODONE HCL 5 MG PO TABS
5.0000 mg | ORAL_TABLET | Freq: Once | ORAL | Status: AC | PRN
  Administered 2013-05-10: 5 mg via ORAL

## 2013-05-10 MED ORDER — LIDOCAINE-EPINEPHRINE 0.5 %-1:200000 IJ SOLN
INTRAMUSCULAR | Status: DC | PRN
  Administered 2013-05-10: 100 mL

## 2013-05-10 MED ORDER — PROPOFOL 10 MG/ML IV BOLUS
INTRAVENOUS | Status: DC | PRN
  Administered 2013-05-10: 200 mg via INTRAVENOUS
  Administered 2013-05-10: 40 mg via INTRAVENOUS

## 2013-05-10 MED ORDER — NEOSTIGMINE METHYLSULFATE 1 MG/ML IJ SOLN
INTRAMUSCULAR | Status: DC | PRN
  Administered 2013-05-10: 3 mg via INTRAVENOUS

## 2013-05-10 MED ORDER — ONDANSETRON HCL 4 MG/2ML IJ SOLN
INTRAMUSCULAR | Status: DC | PRN
  Administered 2013-05-10: 4 mg via INTRAVENOUS

## 2013-05-10 MED ORDER — ROCURONIUM BROMIDE 100 MG/10ML IV SOLN
INTRAVENOUS | Status: DC | PRN
  Administered 2013-05-10: 20 mg via INTRAVENOUS

## 2013-05-10 MED ORDER — LACTATED RINGERS IV SOLN
INTRAVENOUS | Status: DC
  Administered 2013-05-10 (×2): via INTRAVENOUS

## 2013-05-10 MED ORDER — ATROPINE SULFATE 0.4 MG/ML IJ SOLN
INTRAMUSCULAR | Status: DC | PRN
  Administered 2013-05-10: 0.2 mg via INTRAVENOUS

## 2013-05-10 SURGICAL SUPPLY — 64 items
BAG DECANTER FOR FLEXI CONT (MISCELLANEOUS) ×2 IMPLANT
BENZOIN TINCTURE PRP APPL 2/3 (GAUZE/BANDAGES/DRESSINGS) ×2 IMPLANT
BINDER BREAST LRG (GAUZE/BANDAGES/DRESSINGS) IMPLANT
BINDER BREAST MEDIUM (GAUZE/BANDAGES/DRESSINGS) ×2 IMPLANT
BINDER BREAST XLRG (GAUZE/BANDAGES/DRESSINGS) IMPLANT
BINDER BREAST XXLRG (GAUZE/BANDAGES/DRESSINGS) IMPLANT
BLADE KNIFE PERSONA 10 (BLADE) ×2 IMPLANT
BLADE KNIFE PERSONA 15 (BLADE) ×2 IMPLANT
CANISTER SUCTION 1200CC (MISCELLANEOUS) ×2 IMPLANT
CLOTH BEACON ORANGE TIMEOUT ST (SAFETY) ×2 IMPLANT
COVER MAYO STAND STRL (DRAPES) ×2 IMPLANT
COVER TABLE BACK 60X90 (DRAPES) ×2 IMPLANT
DECANTER SPIKE VIAL GLASS SM (MISCELLANEOUS) ×2 IMPLANT
DRAIN CHANNEL 10M FLAT 3/4 FLT (DRAIN) IMPLANT
DRAIN PENROSE 1/4X12 LTX STRL (WOUND CARE) IMPLANT
DRAPE LAPAROSCOPIC ABDOMINAL (DRAPES) ×2 IMPLANT
DRSG PAD ABDOMINAL 8X10 ST (GAUZE/BANDAGES/DRESSINGS) ×4 IMPLANT
ELECT BLADE 6.5 .24CM SHAFT (ELECTRODE) IMPLANT
ELECT REM PT RETURN 9FT ADLT (ELECTROSURGICAL) ×2
ELECTRODE REM PT RTRN 9FT ADLT (ELECTROSURGICAL) ×1 IMPLANT
EVACUATOR SILICONE 100CC (DRAIN) IMPLANT
FILTER 7/8 IN (FILTER) IMPLANT
GAUZE XEROFORM 5X9 LF (GAUZE/BANDAGES/DRESSINGS) ×2 IMPLANT
GLOVE BIO SURGEON STRL SZ 6.5 (GLOVE) IMPLANT
GLOVE BIOGEL M STRL SZ7.5 (GLOVE) IMPLANT
GLOVE BIOGEL PI IND STRL 7.0 (GLOVE) ×1 IMPLANT
GLOVE BIOGEL PI IND STRL 8 (GLOVE) ×1 IMPLANT
GLOVE BIOGEL PI IND STRL 8.5 (GLOVE) ×1 IMPLANT
GLOVE BIOGEL PI INDICATOR 7.0 (GLOVE) ×1
GLOVE BIOGEL PI INDICATOR 8 (GLOVE) ×1
GLOVE BIOGEL PI INDICATOR 8.5 (GLOVE) ×1
GLOVE ECLIPSE 7.0 STRL STRAW (GLOVE) IMPLANT
GOWN PREVENTION PLUS XLARGE (GOWN DISPOSABLE) ×2 IMPLANT
GOWN PREVENTION PLUS XXLARGE (GOWN DISPOSABLE) ×2 IMPLANT
IV NS 500ML (IV SOLUTION) ×1
IV NS 500ML BAXH (IV SOLUTION) ×1 IMPLANT
KIT FILL SYSTEM UNIVERSAL (SET/KITS/TRAYS/PACK) ×2 IMPLANT
NEEDLE HYPO 25X1 1.5 SAFETY (NEEDLE) IMPLANT
NEEDLE SPNL 18GX3.5 QUINCKE PK (NEEDLE) IMPLANT
NS IRRIG 1000ML POUR BTL (IV SOLUTION) ×2 IMPLANT
PACK BASIN DAY SURGERY FS (CUSTOM PROCEDURE TRAY) ×2 IMPLANT
PEN SKIN MARKING BROAD TIP (MISCELLANEOUS) IMPLANT
PIN SAFETY STERILE (MISCELLANEOUS) IMPLANT
SLEEVE SCD COMPRESS KNEE MED (MISCELLANEOUS) ×2 IMPLANT
SPONGE GAUZE 4X4 12PLY (GAUZE/BANDAGES/DRESSINGS) ×2 IMPLANT
SPONGE LAP 18X18 X RAY DECT (DISPOSABLE) ×4 IMPLANT
STRIP SUTURE WOUND CLOSURE 1/2 (SUTURE) IMPLANT
SUT MNCRL AB 3-0 PS2 18 (SUTURE) ×2 IMPLANT
SUT MON AB 2-0 CT1 36 (SUTURE) ×2 IMPLANT
SUT MON AB 5-0 PS2 18 (SUTURE) IMPLANT
SUT PROLENE 3 0 PS 2 (SUTURE) IMPLANT
SYR 20CC LL (SYRINGE) IMPLANT
SYR 50ML LL SCALE MARK (SYRINGE) ×4 IMPLANT
SYR BULB IRRIGATION 50ML (SYRINGE) ×2 IMPLANT
SYR CONTROL 10ML LL (SYRINGE) IMPLANT
TAPE HYPAFIX 6X30 (GAUZE/BANDAGES/DRESSINGS) ×2 IMPLANT
TAPE MEASURE 72IN RETRACT (INSTRUMENTS)
TAPE MEASURE LINEN 72IN RETRCT (INSTRUMENTS) IMPLANT
TOWEL OR 17X24 6PK STRL BLUE (TOWEL DISPOSABLE) ×8 IMPLANT
TRAY DSU PREP LF (CUSTOM PROCEDURE TRAY) ×2 IMPLANT
TUBE CONNECTING 20X1/4 (TUBING) ×2 IMPLANT
UNDERPAD 30X30 INCONTINENT (UNDERPADS AND DIAPERS) ×4 IMPLANT
VAC PENCILS W/TUBING CLEAR (MISCELLANEOUS) ×2 IMPLANT
YANKAUER SUCT BULB TIP NO VENT (SUCTIONS) ×2 IMPLANT

## 2013-05-10 NOTE — Anesthesia Postprocedure Evaluation (Signed)
  Anesthesia Post-op Note  Patient: Heidi [name]  Procedure(s) Performed: Procedure(s): RIGHT REMOVAL OF TISSUE EXPANDER  (Right)  Patient Location: PACU  Anesthesia Type:General  Level of Consciousness: awake, alert , oriented and patient cooperative  Airway and Oxygen Therapy: Patient Spontanous Breathing  Post-op Pain: mild  Post-op Assessment: Post-op Vital signs reviewed, Patient's Cardiovascular Status Stable, Respiratory Function Stable, Patent Airway, No signs of Nausea or vomiting and Pain level controlled  Post-op Vital Signs: Reviewed and stable  Complications: No apparent anesthesia complications

## 2013-05-10 NOTE — Anesthesia Preprocedure Evaluation (Signed)
Anesthesia Evaluation  Patient identified by MRN, date of birth, ID band Patient awake    Reviewed: Allergy & Precautions, H&P , NPO status , Patient's Chart, lab work & pertinent test results  History of Anesthesia Complications (+) PONV  Airway Mallampati: II TM Distance: >3 FB Neck ROM: Full    Dental  (+) Teeth Intact and Dental Advisory Given   Pulmonary asthma , sleep apnea and Continuous Positive Airway Pressure Ventilation ,  breath sounds clear to auscultation  Pulmonary exam normal       Cardiovascular hypertension, Pt. on medications Rhythm:Regular Rate:Normal     Neuro/Psych  Headaches, Anxiety Depression    GI/Hepatic Neg liver ROS, GERD-  Medicated and Poorly Controlled,  Endo/Other  diabetes (glu 99), Well Controlled, Type 2, Insulin DependentMorbid obesity  Renal/GU negative Renal ROS     Musculoskeletal   Abdominal (+) + obese,   Peds  Hematology   Anesthesia Other Findings   Reproductive/Obstetrics                           Anesthesia Physical Anesthesia Plan  ASA: III  Anesthesia Plan: General   Post-op Pain Management:    Induction: Intravenous and Rapid sequence  Airway Management Planned: Oral ETT  Additional Equipment:   Intra-op Plan:   Post-operative Plan: Extubation in OR  Informed Consent: I have reviewed the patients History and Physical, chart, labs and discussed the procedure including the risks, benefits and alternatives for the proposed anesthesia with the patient or authorized representative who has indicated his/her understanding and acceptance.   Dental advisory given  Plan Discussed with: CRNA and Surgeon  Anesthesia Plan Comments: (Plan routine monitors, GETA with rapid sequence induction)        Anesthesia Quick Evaluation

## 2013-05-10 NOTE — H&P (Signed)
Heidi [name] is an 57 y.o. female.   Chief Complaint: Right tissue expander deflatted after reconstruction HPI: SP RIGHT BREAST RECONSTRUCTION after mastectomy for braest cancer. Implant deflatted over the weekend and now has exposed port at the site  Past Medical History  Diagnosis Date  . Anxiety   . Depression   . Diabetes mellitus     IDDM  . GERD (gastroesophageal reflux disease)   . Arthritis   . Headache(784.0)     migraines  . Asthma     prn neb.  . Environmental allergies   . Hypertension     under control with med., has been on med. x 10-15 yr.  . Sleep apnea     uses CPAP nightly  . Cancer     right breast cancer  . Anemia     takes iron supplement  . History of MRSA infection 08/2012    right breast  . Overactive bladder   . PONV (postoperative nausea and vomiting)     used scop patch 7/14-please use again per pt request    Past Surgical History  Procedure Laterality Date  . Mastectomy Right 03/2012  . Shoulder arthroscopy Right   . Ankle arthroscopy Left   . Cesarean section    . Breast reconstruction  05/24/2012    Procedure: BREAST RECONSTRUCTION;  Surgeon: Louisa Second, MD;  Location: [facility];  Service: Plastics;  Laterality: Right;  NO FLEX HD  . Tissue expander placement  05/24/2012    Procedure: TISSUE EXPANDER;  Surgeon: Louisa Second, MD;  Location: [facility];  Service: Plastics;  Laterality: Right;  . Breast reconstruction Right 01/17/2013    Procedure: RIGHT BREAST RECONSTRUCTION WITH PLACEMENT OF TISSUE EXPANDER ;  Surgeon: Louisa Second, MD;  Location: [facility];  Service: Plastics;  Laterality: Right;  . Breast reduction surgery Left 02/28/2013    Procedure: LEFT BREAST REDUCTION  (BREAST);  Surgeon: Louisa Second, MD;  Location:  SUR[facility]ERY [facility];  Service: Plastics;  Laterality: Left;    History reviewed. No pertinent family history. Social History:  reports that she has  never smoked. She has never used smokeless tobacco. She reports that she does not drink alcohol or use illicit drugs.  Allergies:  Allergies  Allergen Reactions  . Soap Other (See Comments)    FRAGRANCES CAUSE MIGRAINES  . Latex Itching  . Sulfa Antibiotics Rash    Medications Prior to Admission  Medication Sig Dispense Refill  . benazepril-hydrochlorthiazide (LOTENSIN HCT) 20-12.5 MG per tablet Take 1 tablet by mouth daily.      . carbamazepine (TEGRETOL) 100 MG chewable tablet Chew 100 mg by mouth daily.       . clonazePAM (KLONOPIN) 1 MG tablet Take 0.5 mg by mouth at bedtime as needed.       Marland Kitchen dexlansoprazole (DEXILANT) 60 MG capsule Take 60 mg by mouth 2 (two) times daily.       . DULoxetine (CYMBALTA) 60 MG capsule Take 60 mg by mouth daily.      . fexofenadine-pseudoephedrine (ALLEGRA-D 24) 180-240 MG per 24 hr tablet Take 1 tablet by mouth daily.      . hydrOXYzine (ATARAX/VISTARIL) 25 MG tablet Take 25 mg by mouth every evening.       . insulin detemir (LEVEMIR) 100 UNIT/ML injection Inject 40 Units into the skin at bedtime.      . insulin lispro protamine-insulin lispro (HUMALOG 75/25) (75-25) 100 UNIT/ML SUSP Inject 7 Units into the  skin 2 (two) times daily with a meal.       . oxybutynin (DITROPAN-XL) 10 MG 24 hr tablet Take 10 mg by mouth 2 (two) times daily. 5mg       . ranitidine (ZANTAC) 150 MG tablet Take 150 mg by mouth at bedtime.       . topiramate (TOPAMAX) 200 MG tablet Take 100 mg by mouth daily.       . traZODone (DESYREL) 50 MG tablet Take 50 mg by mouth at bedtime.      Marland Kitchen albuterol (PROVENTIL) (2.5 MG/3ML) 0.083% nebulizer solution Take 2.5 mg by nebulization every 6 (six) hours as needed.      . ferrous gluconate (FERGON) 325 MG tablet Take 325 mg by mouth daily with breakfast.      . insulin glargine (LANTUS) 100 UNIT/ML injection Inject 80 Units into the skin at bedtime.        Results for orders placed during the hospital encounter of 05/10/13 (from the  past 48 hour(s))  GLUCOSE, CAPILLARY     Status: None   Collection Time    05/10/13  6:52 AM      Result Value Range   Glucose-Capillary 99  70 - 99 mg/dL  POCT I-STAT, CHEM 8     Status: Abnormal   Collection Time    05/10/13  7:18 AM      Result Value Range   Sodium 140  135 - 145 mEq/L   Potassium 4.0  3.5 - 5.1 mEq/L   Chloride 105  96 - 112 mEq/L   BUN 18  6 - 23 mg/dL   Creatinine, Ser 2.95  0.50 - 1.10 mg/dL   Glucose, Bld 621 (*) 70 - 99 mg/dL   Calcium, Ion 3.08  6.57 - 1.23 mmol/L   TCO2 23  0 - 100 mmol/L   Hemoglobin 10.2 (*) 12.0 - 15.0 g/dL   HCT 84.6 (*) 96.2 - 95.2 %   No results found.  Review of Systems  Constitutional: Negative.   HENT: Negative.   Eyes: Negative.   Respiratory: Negative.   Cardiovascular: Negative.   Gastrointestinal: Positive for heartburn.  Genitourinary: Negative.   Musculoskeletal: Positive for myalgias.  Skin: Positive for rash.  Neurological: Negative.   Endo/Heme/Allergies: Negative.   Psychiatric/Behavioral: Negative.     Blood pressure 106/69, pulse 44, temperature 97.7 F (36.5 C), temperature source Oral, resp. rate 18, height 5\' 6"  (1.676 m), weight 124.512 kg (274 lb 8 oz), last menstrual period 05/20/2012, SpO2 99.00%. Physical Exam   Assessment/Plan Deflatted right tissue expander for removal and possible replacement  Dorthy Magnussen L 05/10/2013, 7:22 AM

## 2013-05-10 NOTE — Anesthesia Procedure Notes (Signed)
Procedure Name: Intubation Date/Time: 05/10/2013 7:39 AM Performed by: Meyer Russel Pre-anesthesia Checklist: Patient identified, Emergency Drugs available, Suction available and Patient being monitored Patient Re-evaluated:Patient Re-evaluated prior to inductionOxygen Delivery Method: Circle System Utilized Preoxygenation: Pre-oxygenation with 100% oxygen Intubation Type: IV induction Ventilation: Mask ventilation without difficulty Laryngoscope Size: Miller and 2 Grade View: Grade II Tube type: Oral Tube size: 7.0 mm Number of attempts: 1 Airway Equipment and Method: stylet Placement Confirmation: ETT inserted through vocal cords under direct vision,  positive ETCO2 and breath sounds checked- equal and bilateral Secured at: 22 cm Tube secured with: Tape Dental Injury: Teeth and Oropharynx as per pre-operative assessment

## 2013-05-10 NOTE — Transfer of Care (Signed)
Immediate Anesthesia Transfer of Care Note  Patient: Heidi [name]  Procedure(s) Performed: Procedure(s): RIGHT REMOVAL OF TISSUE EXPANDER  (Right)  Patient Location: PACU  Anesthesia Type:General  Level of Consciousness: awake, alert  and oriented  Airway & Oxygen Therapy: Patient Spontanous Breathing and Patient connected to face mask oxygen  Post-op Assessment: Report given to PACU RN, Post -op Vital signs reviewed and stable and Patient moving all extremities  Post vital signs: Reviewed and stable  Complications: No apparent anesthesia complications

## 2013-05-10 NOTE — Brief Op Note (Signed)
05/10/2013  8:22 AM  PATIENT:  Milon Score  57 y.o. female  PRE-OPERATIVE DIAGNOSIS:  RUPTURED IMPLANT RIGHT BREAST  POST-OPERATIVE DIAGNOSIS:  RUPTURED IMPLANT RIGHT BREAST  PROCEDURE:  Procedure(s): RIGHT REMOVAL OF TISSUE EXPANDER  (Right)  SURGEON:  Surgeon(s) and Role:    * Louisa Second, MD - Primary  PHYSICIAN ASSISTANT:   ASSISTANTS: none   ANESTHESIA:   general  EBL:  Total I/O In: 1000 [I.V.:1000] Out: -   BLOOD ADMINISTERED:none  DRAINS: none   LOCAL MEDICATIONS USED:  LIDOCAINE   SPECIMEN:  Excision  DISPOSITION OF SPECIMEN:  PATHOLOGY  COUNTS:  YES  TOURNIQUET:  * No tourniquets in log *  DICTATION: .Other Dictation: Dictation Number [id number]  PLAN OF CARE: Discharge to home after PACU  PATIENT DISPOSITION:  PACU - hemodynamically stable.   Delay start of Pharmacological VTE agent (>24hrs) due to surgical blood loss or risk of bleeding: yes

## 2013-05-11 ENCOUNTER — Encounter (HOSPITAL_BASED_OUTPATIENT_CLINIC_OR_DEPARTMENT_OTHER): Payer: Self-pay | Admitting: Specialist

## 2013-05-11 NOTE — Op Note (Signed)
Heidi [name], Heidi [name]               ACCOUNT NO.:  [id number]  MEDICAL RECORD NO.:  [id number]  LOCATION:                               FACILITY:  [facility]  PHYSICIAN:  Earvin Hansen L. Massai Hankerson, M.D.DATE OF BIRTH:  Apr 19, 1956  DATE OF PROCEDURE:  05/10/2013 DATE OF DISCHARGE:  05/10/2013                              OPERATIVE REPORT   The patient well known to me who has had a reconstruction of right breast with tissue expansion, had complications early with infections first time, we had that improved and treated and then had tissue expansion done, has done very well from that.  Over the last 2 days, she called me and told me that implant was leaking, saw in the office yesterday, September 22, had fully deflated implant with exposure of the port.  I questioned the patient in detail in regards to exertional activities, bumps, trauma, and __________ which she did not with this, the drainage is minimum.  There is no cellulitis, there is no purulence. So, we scheduled her today for semi-emergency surgery to remove the implant, __________ the space.  If there is purulence, then hold off on replacement implant and if there is no purulence, possible replace the tissue expander.  She understands procedure in detail as well as attendant risks and possible complications.  PROCEDURE:  The patient was taken to the operating room, placed on operating room table in the supine position, with adequate general anesthesia and intubated orally.  Prep was done to the chest and breast areas in routine fashion using Hibiclens soap solution, walled off with sterile towels and drapes so as to make a sterile field.  Examination of the right chest areas shows slightly exposed tissue expander port.  The tissue around the area is very thin, there is no evidence of any drainage or purulence from the area.  On pressing the implant, it seems to be almost deflated.  Xylocaine 0.25% with epinephrine injected locally into  the skin areas, total of 150 mL 1:400,000 concentration. The incision transverse was opened down to underlying port, all that tissue was debrided around completely to good solid skin tissue.  The port was exposed, then we dissected up to the muscle which was already intact, the coupling of the port was still intact and then we were able to open up the pectoralis major muscle and remove the implant.  There seems to be, maybe 1/4th field, I did not maneuver it and not tamper with it and send it to Pathology for examination.  Examination of the space shows there is some serosanguineous fluid.  No smell, no gross pus, but we did cultures anyway for aerobic and anaerobic, fungi, sent those today, and we irrigated the pocket with copious amounts of saline, approximately 2 L and then we closed the wounds back loosely without the skin closure with deep sutures of 2-0 and 3-0 Monocryl.  The wounds were cleansed, covered with Xeroform and sterile dressings.  We will do a dressing change probably on this Thursday 2 days from now, and the patient to call me for any problems with fever, chills, drainage, smell. She is placed on doxycycline preoperatively and will continue that postoperatively and  she will call me if any problems after she goes home today.     [name]. [name], M.D.     [name]  D:  05/10/2013  T:  05/10/2013  Job:  [id number]

## 2013-05-12 LAB — WOUND CULTURE

## 2013-05-15 LAB — ANAEROBIC CULTURE

## 2013-05-16 ENCOUNTER — Encounter (HOSPITAL_BASED_OUTPATIENT_CLINIC_OR_DEPARTMENT_OTHER): Admission: RE | Payer: Self-pay | Source: Ambulatory Visit

## 2013-05-16 ENCOUNTER — Ambulatory Visit (HOSPITAL_BASED_OUTPATIENT_CLINIC_OR_DEPARTMENT_OTHER): Admission: RE | Admit: 2013-05-16 | Payer: BC Managed Care – PPO | Source: Ambulatory Visit | Admitting: Specialist

## 2013-05-16 SURGERY — AEROLA/NIPPLE RECONSTRUCTION WITH GRAFT
Anesthesia: General | Site: Breast | Laterality: Right

## 2013-06-23 ENCOUNTER — Other Ambulatory Visit: Payer: Self-pay

## 2017-01-08 ENCOUNTER — Encounter: Payer: Self-pay | Admitting: Gastroenterology

## 2017-02-05 ENCOUNTER — Encounter: Payer: Self-pay | Admitting: Nurse Practitioner

## 2017-02-05 ENCOUNTER — Ambulatory Visit (INDEPENDENT_AMBULATORY_CARE_PROVIDER_SITE_OTHER): Payer: BLUE CROSS/BLUE SHIELD | Admitting: Nurse Practitioner

## 2017-02-05 DIAGNOSIS — K5900 Constipation, unspecified: Secondary | ICD-10-CM | POA: Diagnosis not present

## 2017-02-05 DIAGNOSIS — R634 Abnormal weight loss: Secondary | ICD-10-CM

## 2017-02-05 DIAGNOSIS — R109 Unspecified abdominal pain: Secondary | ICD-10-CM

## 2017-02-05 DIAGNOSIS — K317 Polyp of stomach and duodenum: Secondary | ICD-10-CM

## 2017-02-05 DIAGNOSIS — Z86010 Personal history of colonic polyps: Secondary | ICD-10-CM

## 2017-02-05 DIAGNOSIS — K2270 Barrett's esophagus without dysplasia: Secondary | ICD-10-CM

## 2017-02-05 NOTE — Progress Notes (Signed)
Primary Care Physician:  Dhivianathan, Candida Peeling, MD Primary Gastroenterologist:  Dr. Oneida Alar  Chief Complaint  Patient presents with  . Constipation    "sometimes like dumping syndrome"  . burning in esophagus  . Abdominal Pain    ruq  . Gas    sulfur burps  . Weight Loss    lost 100 lbs since last June    HPI:   Heidi [name] is a 61 y.o. female who presents on referral from primary care. Extensive notes were provided. The patient last saw primary care on 01/07/2017. At that point she noted she recently saw Dr. Guinevere Ferrari of and was told after endoscopy he had removed several polyps and was larger when they couldn't remove but it could be done laparoscopically and that "he wouldn't be surprised if they were cancerous." She was then told they were not cancerous on her follow-up and they could go back and remove it. She is requesting a second opinion.  Last follow-up visit with GI in [place], [place] noted history of Barrett's esophagus diagnosed in 2015. Constipation improving and was started on GlycoLax and recommended increased fruits and vegetables. She was scheduled for upper endoscopy.  After an attempt to review the approximate 1 inch of records it appears the last EGD was completed on 03/24/2016 which found esophagitis with possible short segment Barrett's, gastric polyps 1-2 mm polyp biopsied from the body of the stomach and a 1 cm submucosal polyp in the antrum. Colonoscopy completed the same day found two 2-3 mm polyps in the descending colon, internal hemorrhoids, limited exam due to thick stool. Recommended high-fiber diet, follow-up in the office if symptoms arise. Surgical pathology found the gastric polyp to be inflammatory polyp without H. pylori with chronic gastritis and esophagitis. Surgical pathology of the colon polyp found to be adenomatous in the descending colon. No recommendations found in the record for repeat exam, despite inadequate prep.  Today she states she's  frustrated. States she last had colonoscopy and EGD in 2018 (around April?) but these were not included in her records. She states she's still having constipation and is on Linzess 2990 mcg which doesn't help. Has a bowel movement about once a week, stools are hard and requires straining; occasionally has to self disimpact. Has "sulfur burps." Still has bloating. Lost over 100 lbs in the past year while on treatment for breast CA. Is wanting to lose 15 more lbs but having difficulty doing this. States she's having a raspy voice, which occasionally self resolves but will return. Has abdominal pain mid/generalized abdomen which improves with a bowel movement. Also has focal tenderness of RUQ, no worsening after eating. Has nausea which is improved about 1-2 times a month with occasional vomiting. Chronic reflux which is only responsive to GERD. Denies chest pain, dyspnea, dizziness, lightheadedness, syncope, near syncope. Denies any other upper or lower GI symptoms.  Past Medical History:  Diagnosis Date  . Anemia    takes iron supplement  . Anxiety   . Arthritis   . Asthma    prn neb.  . Cancer [facility] 2)    right breast cancer  . Depression   . Diabetes mellitus    IDDM  . Environmental allergies   . GERD (gastroesophageal reflux disease)   . Headache(784.0)    migraines  . History of MRSA infection 08/2012   right breast  . Hypertension    under control with med., has been on med. x 10-15 yr.  . Overactive bladder   .  PONV (postoperative nausea and vomiting)    used scop patch 7/14-please use again per pt request  . Sleep apnea    uses CPAP nightly    Past Surgical History:  Procedure Laterality Date  . ANKLE ARTHROSCOPY Left   . BREAST RECONSTRUCTION  05/24/2012   Procedure: BREAST RECONSTRUCTION;  Surgeon: Cristine Polio, MD;  Location: [place];  Service: Plastics;  Laterality: Right;  NO FLEX HD  . BREAST RECONSTRUCTION Right 01/17/2013   Procedure: RIGHT BREAST  RECONSTRUCTION WITH PLACEMENT OF TISSUE EXPANDER ;  Surgeon: Cristine Polio, MD;  Location: [place];  Service: Plastics;  Laterality: Right;  . BREAST REDUCTION SURGERY Left 02/28/2013   Procedure: LEFT BREAST REDUCTION  (BREAST);  Surgeon: Cristine Polio, MD;  Location: [place];  Service: Plastics;  Laterality: Left;  . CESAREAN SECTION    . MASTECTOMY Right 03/2012  . REMOVAL OF TISSUE EXPANDER AND PLACEMENT OF IMPLANT Right 05/10/2013   Procedure: RIGHT REMOVAL OF TISSUE EXPANDER ;  Surgeon: Cristine Polio, MD;  Location: [place];  Service: Plastics;  Laterality: Right;  . SHOULDER ARTHROSCOPY Right   . TISSUE EXPANDER PLACEMENT  05/24/2012   Procedure: TISSUE EXPANDER;  Surgeon: Cristine Polio, MD;  Location: [place];  Service: Plastics;  Laterality: Right;    Current Outpatient Prescriptions  Medication Sig Dispense Refill  . benazepril-hydrochlorthiazide (LOTENSIN HCT) 20-12.5 MG per tablet Take 1 tablet by mouth daily.    . clonazePAM (KLONOPIN) 1 MG tablet Take 0.5 mg by mouth at bedtime as needed.     Marland Kitchen dexlansoprazole (DEXILANT) 60 MG capsule Take 1 capsule by mouth daily.    . DULoxetine (CYMBALTA) 60 MG capsule Take 60 mg by mouth daily.    Marland Kitchen linaclotide (LINZESS) 290 MCG CAPS capsule Take 290 mcg by mouth at bedtime.    Marland Kitchen liothyronine (CYTOMEL) 5 MCG tablet Take by mouth daily. Total of 10mcg/day (takes 85mcg in morning and 13mcg at night)    . methadone (DOLOPHINE) 5 MG tablet Take 5 mg by mouth as needed.    Marland Kitchen oxybutynin (DITROPAN-XL) 10 MG 24 hr tablet Take 5 mg by mouth 2 (two) times daily. 5mg     . potassium chloride (K-DUR) 10 MEQ tablet Take 1 tablet by mouth daily.    Marland Kitchen PRESCRIPTION MEDICATION Cyanocobalam inj 1079mcg once weekly    . sitaGLIPtin-metformin (JANUMET) 50-1000 MG tablet Take 1 tablet by mouth at bedtime.    . traZODone (DESYREL) 50 MG tablet Take 50 mg by mouth at bedtime.     No  current facility-administered medications for this visit.     Allergies as of 02/05/2017 - Review Complete 02/05/2017  Allergen Reaction Noted  . Soap Other (See Comments) 02/22/2013  . Latex Itching 02/22/2013  . Sulfa antibiotics Rash 05/20/2012    Family History  Problem Relation Age of Onset  . Colon cancer Paternal Grandmother   . Colon cancer Paternal Grandfather     Social History   Social History  . Marital status: Married    Spouse name: N/A  . Number of children: N/A  . Years of education: N/A   Occupational History  . Not on file.   Social History Main Topics  . Smoking status: Never Smoker  . Smokeless tobacco: Never Used  . Alcohol use No  . Drug use: No  . Sexual activity: Not on file   Other Topics Concern  . Not on file   Social History  Narrative  . No narrative on file    Review of Systems: Complete ROS negative except as per HPI.    Physical Exam: BP (!) 158/79   Pulse 74   Temp 98 F (36.7 C) (Oral)   Ht 5\' 6"  (1.676 m)   Wt 208 lb 9.6 oz (94.6 kg)   LMP 05/20/2012   BMI 33.67 kg/m  General:   Obese female. Alert and oriented. Pleasant and cooperative. Well-nourished and well-developed.  Head:  Normocephalic and atraumatic. Eyes:  Without icterus, sclera clear and conjunctiva pink.  Ears:  Normal auditory acuity. Cardiovascular:  S1, S2 present without murmurs appreciated. Extremities without clubbing or edema. Respiratory:  Clear to auscultation bilaterally. No wheezes, rales, or rhonchi. No distress.  Gastrointestinal:  +BS, soft, non-tender and non-distended. No HSM noted. No guarding or rebound. No masses appreciated.  Rectal:  Deferred  Musculoskalatal:  Symmetrical without gross deformities. Neurologic:  Alert and oriented x4;  grossly normal neurologically. Psych:  Alert and cooperative. Normal mood and affect. Heme/Lymph/Immune: No excessive bruising noted.  **T total of 60 minutes was spent with the patient, at least 50%  of which was spent reviewing records/results with her, counseling her, and educating her related to her GI problems**    02/05/2017 9:48 AM   Disclaimer: This note was dictated with voice recognition software. Similar sounding words can inadvertently be transcribed and may not be corrected upon review.

## 2017-02-05 NOTE — Assessment & Plan Note (Signed)
Patient with a history of Barrett's esophagus diagnosed in 2015, per Dr. Milas Kocher notes. Per patient description she does have an upper endoscopy as well as colonoscopy within the past 2 months. These were not included with theprovided by her previous GI provider. We will request these. Pending last EGD, will make decisions about when she is due for surveillance of Barrett's esophagus. Return for follow-up in 2 months.

## 2017-02-05 NOTE — Patient Instructions (Signed)
1. We will request your most recent reports from Dr. Milas Kocher office 2. Stop taking Linzess and MiraLAX. 3. Start taking Amitiza 24 g. Take this twice a day, on a full stomach. 4. I am providing you with samples to last 2 weeks. 5. Call us in 1-2 weeks and let us know if it is helping. 6. Return for follow-up in 2 months. 7. Call if any worsening or severe symptoms.

## 2017-02-05 NOTE — Assessment & Plan Note (Signed)
The patient notes significant weight loss of about 100 pounds in the past year, however she was undergoing rest cancer treatment at this time. Her weight seems to have stabilized since completing treatment. She states she would like to lose 15 more pounds but is having difficulty doing this. Weight loss likely due to effects of chemotherapy and cancer treatment and seems to have stabilized. We will continue to monitor her weight closely. Return for follow-up in 2 months.

## 2017-02-05 NOTE — Assessment & Plan Note (Signed)
The patient has a history of colon polyps. The last colonoscopy report included with previous GI notes was completed on 03/18/2016 this found two 2-3 mm polyps in the descending colon, internal hemorrhoids, thick stools limiting exam/poor prep. Recommended high-fiber diet, follow-up with outpatient appointment. Surgical pathology found adenomatous polyp in the descending colon. No recommendations for repeat exam. The patient states that she just had a repeat colonoscopy 2 months ago. We will request these records is a were not included with her Notes today. Return for follow-up in 2 months. Other than constipation, nausea, " sulfur burps" she is overall asymptomatic from a GI standpoint. She has had some weight loss as well, but has undergone chemotherapy for breast cancer and this is a likely possible explanation. It seems her weight has stabilized recently. Return for follow-up in 2 months.

## 2017-02-05 NOTE — Assessment & Plan Note (Signed)
The patient has a history of gastric polyps. The last upper endoscopy included with previous GI notes was completed in August 2017 which found esophagitis and 1-2 mm polyp biopsied from the body of the stomach as well as a 1 cm submucosal polyp in the antrum status post biopsy. However, surgical pathology only commented on the polyp biopsied from the antrum and was found to be inflammatory polyp without H. pylori. Unsure of the pathology related to the Astra body polyps. The patient states she had another EGD in April of this year, approximately 2 months ago, but these records were not included. We will request them. Return for follow-up in 2 months.

## 2017-02-05 NOTE — Assessment & Plan Note (Signed)
The patient admits generalized abdominal pain as well as focal right upper quadrant pain. Her abdominal pain generally improves after a bowel movement. Her pain is likely related to constipation. Her right upper quadrant focal pain which is reproducible on exam, but not apparently severe could also be related to constipation. Although biliary etiology is not ruled out at this point. We will work on her constipation and only can have improvement in that we will reassess her abdominal pain and, specifically, right upper quadrant focal pain for any improvement and plan for further workup as appropriate. Return for follow-up in 2 months. Constipation management as per above.

## 2017-02-05 NOTE — Assessment & Plan Note (Signed)
Notes chronic constipation which persists despite Linzess 290 g. She has a bowel movement about once a week stools are hard, and require straining. Occasionally has to self disimpact. Also with bloating, abdominal pain that improves with bowel movement, nausea, and "sulfur burps." All of these could be in part related to her constipation. She is also previously tried Movantik because she is on methadone but this was not effective. I will have her stop Linzess, and we'll trial her on Amitiza twice a day with a full stomach. I will provide samples for 2 weeks and request a progress report in 1-2 weeks. We can make other adjustments in medications over the phone prior to her follow-up. Return for follow-up in 2 months.

## 2017-02-05 NOTE — Progress Notes (Signed)
Updated records received some the patient's previous GI. The patient last saw GI on 11/13/2016 in the office visit. Recommended upper GI with dilation. EGD with dilation was performed on 11/13/2016 which found hiatal hernia, irregular Z line status post biopsy, multiple 2-3 mm polyps in the body of the stomach and about a 1.5-2 cm polyp in the antrum, status post biopsy. Duodenum biopsy taken to rule out celiac disease. Duodenal biopsy with no pathologic change. Stomach biopsy with mild chronic gastritis and no Helicobacter pylori. Noted chronic cardiac gastritis and esophagitis. No common related to Barrett's esophagus. The patient did undergo esophageal biopsy at that time as well.  Colonoscopy completed 10/29/2016 which found mucosa of the cecum, ascending colon, hepatic flexure, transverse colon, splenic flexure, descending colon, sigmoid colon, and rectum looked grossly normal. Tortuous colon, somewhat dilated. Recommended high-fiber diet. Internal hemorrhoids also noted.  These records, along with other records received previously, will be marked for scanning into the system.

## 2017-02-06 NOTE — Progress Notes (Signed)
CC'ED TO PCP 

## 2017-02-20 ENCOUNTER — Telehealth: Payer: Self-pay | Admitting: Gastroenterology

## 2017-02-20 NOTE — Telephone Encounter (Signed)
We can have her try Trulance 3 mg once a day with or without food. She can pick-up samples for 1-2 weeks and call with progress report in 1 week.  If Trulance is not effective, we will likely need to stack medications (ie- Linzess and MiraLAX).

## 2017-02-20 NOTE — Telephone Encounter (Signed)
Pt said she had seen EG about 2 weeks ago and was given samples to try (does not remember name of samples). She said the samples did not work and she needed something else or what would EG recommend. Please advise and call [phone]

## 2017-02-20 NOTE — Telephone Encounter (Signed)
Pt said the Amitiza 24 mcg bid with food is not working. She has had only 2 Bm's since she was in office, June 21st. They were good Bm's but that is all. Please advise!

## 2017-02-22 NOTE — Progress Notes (Signed)
REVIEWED-NO ADDITIONAL RECOMMENDATIONS. 

## 2017-02-23 NOTE — Telephone Encounter (Signed)
Called and informed pt. She will come by our office and pick-up Trulance samples. 2 week supply placed at the front desk.

## 2017-03-03 ENCOUNTER — Telehealth: Payer: Self-pay | Admitting: Gastroenterology

## 2017-03-03 NOTE — Telephone Encounter (Signed)
PT said the Trulance samples did not help at all. She has not had a Bm since she started the Trulance. She is beginning to get a little miserable. Please advise!

## 2017-03-03 NOTE — Telephone Encounter (Signed)
Pt said that she was given samples to take (doesn't know name of them). She said they did not help her and she needs something else. Please advise and call her at [phone]. She also said to [facility] if she wasn't available because she had a death in the family

## 2017-03-03 NOTE — Telephone Encounter (Signed)
At this point she has tried and failed Linzess, Amitiza, movantic. We can give her samples of Symproic (indicated for OIC). Take 0.2mg  tablet once a day, only while on narcotics (Methadone). We can leave samples x 2 weeks and copay card at the front for her to pick up.  We running low on options at this point. We may have to stack medications if this doesn't work.

## 2017-03-03 NOTE — Telephone Encounter (Signed)
Pt is aware of the info and the samples are at front for her to pick up.

## 2017-03-03 NOTE — Telephone Encounter (Signed)
Pt called back and said she finally had a good Bm this morning. She said that she just needs to get on something that she can have a Bm daily. Please advise!

## 2017-03-19 ENCOUNTER — Telehealth: Payer: Self-pay | Admitting: Gastroenterology

## 2017-03-19 MED ORDER — NALDEMEDINE TOSYLATE 0.2 MG PO TABS: 1.0000 | ORAL_TABLET | Freq: Every day | ORAL | 5 refills | Status: DC

## 2017-03-19 NOTE — Telephone Encounter (Signed)
Forwarding to refill box.  

## 2017-03-19 NOTE — Telephone Encounter (Signed)
Please tell the patient the Rx was sent to the pharmacy per request.

## 2017-03-19 NOTE — Addendum Note (Signed)
Addended by: Gordy Levan, ERIC A on: 03/19/2017 03:35 PM   Modules accepted: Orders

## 2017-03-19 NOTE — Telephone Encounter (Signed)
Left Vm that Rx was sent in.  

## 2017-03-19 NOTE — Telephone Encounter (Signed)
Patient said that EG had given her samples of Symproic to try and they are working well for her and needs a prescription called into [facility] in [place] on [address].

## 2017-04-08 ENCOUNTER — Encounter: Payer: Self-pay | Admitting: Nurse Practitioner

## 2017-04-08 ENCOUNTER — Ambulatory Visit (INDEPENDENT_AMBULATORY_CARE_PROVIDER_SITE_OTHER): Payer: BLUE CROSS/BLUE SHIELD | Admitting: Nurse Practitioner

## 2017-04-08 VITALS — BP 133/68 | HR 75 | Temp 97.5°F | Ht 66.0 in | Wt 212.4 lb

## 2017-04-08 DIAGNOSIS — R109 Unspecified abdominal pain: Secondary | ICD-10-CM | POA: Diagnosis not present

## 2017-04-08 DIAGNOSIS — K5900 Constipation, unspecified: Secondary | ICD-10-CM

## 2017-04-08 DIAGNOSIS — K2270 Barrett's esophagus without dysplasia: Secondary | ICD-10-CM | POA: Diagnosis not present

## 2017-04-08 NOTE — Assessment & Plan Note (Addendum)
Constipation somewhat improved on Symproic. However, even with this she is only having a bowel movement 3 times a week and noted sensation of incomplete emptying. She did have one day where she took Amitiza 24 g and Symproic at the same time and had a large productive bowel movement. We will likely need to stack medications. Not sure that 24 g Amitiza and Symproic are appropriate together and likely would not be covered. At this point I discussed trialing adding gentler options which is okay with. We will start with Colace twice a day in addition to Symproic every day. If this is not effective we can try MiraLAX. She will call with a progress report in one week. Return for follow-up in 3 months.

## 2017-04-08 NOTE — Progress Notes (Signed)
Referring Provider: Donalynn Furlong, * Primary Care Physician:  Donalynn Furlong, MD Primary GI:  Dr. Oneida Alar  Chief Complaint  Patient presents with  . Constipation    HPI:   Heidi [name] is a 61 y.o. female who presents for follow-up on Barrett's esophagus and constipation. The patient was last seen in our office 02/05/2017 for the same as well as history of colon polyps, history of gastric polyps, and weight loss. At that time she reports her frustration. Was on Linzess 290 g with persistent constipation. Having a bowel movement about once a week with hard stools that require straining and often has to self disimpact. Still with bloating, has lost over 100 pounds in the past year while on treatment for breast cancer. Notes a raspy voice which occasionally self resolves but then returns. Mid abdominal pain/generalized abdominal pain which improves with a bowel movement. Noted focal tenderness right upper quadrant without worsening, nausea has improved but still occurs 1-2 times a month with occasional vomiting. Chronic reflux. No other GI symptoms. States she recently had a colonoscopy about 2 months prior. She was advised to stop Linzess, started on Amitiza with samples for 2 weeks and requested callback progress report in 1-2 weeks. Return for follow-up in 2 months.  She called back saying Amitiza did not help. She was trialed on Trulance 3 mg once a day. She called saying Trulance did not help either. At that point she tried and failed Linzess, Amitiza, Movantik, Trulance. She was started on Symproic for possible opioid-induced constipation 0.2 mg once a day while on narcotics (methadone) is. She was given samples for 2 weeks. She called back stating Symproic was working well for her requested a prescription which was sent to her pharmacy.  Updated records from previous GI: EGD with dilation was performed on 11/13/2016 which found hiatal hernia, irregular Z line status post  biopsy, multiple 2-3 mm polyps in the body of the stomach and about a 1.5-2 cm polyp in the antrum, status post biopsy. Duodenum biopsy taken to rule out celiac disease. Duodenal biopsy with no pathologic change. Stomach biopsy with mild chronic gastritis and no Helicobacter pylori. Noted chronic cardiac gastritis and esophagitis. No comment related to Barrett's esophagus. The patient did undergo esophageal biopsy at that time as well.  Today she states she's doing well overall. She states Symproic worked best of any medicine she's tried yet. She did have a significant episode of constipation and she took Amitiza 24 mcg in addition to Symproic and has a good, productive bowel movement. She was in the ER for significant abdominal and chest pain and noted to hive a lot of gas in her colon. She is still taking Symproic with 2-3 bowel movements a day, but still with sensation of incomplete emptying. Denies N/V. No abdominal pain after taking both medications. Denies hematochezia, melena, fever, chills, unintentional weight loss. Denies chest pain, dyspnea, dizziness, lightheadedness, syncope, near syncope. Denies any other upper or lower GI symptoms.  GERD well controlled on Dexilant.  Past Medical History:  Diagnosis Date  . Anemia    takes iron supplement  . Anxiety   . Arthritis   . Asthma    prn neb.  . Cancer [facility])    right breast cancer  . Depression   . Diabetes mellitus    IDDM  . Environmental allergies   . GERD (gastroesophageal reflux disease)   . Headache(784.0)    migraines  . History of MRSA infection 08/2012  right breast  . Hypertension    under control with med., has been on med. x 10-15 yr.  . Overactive bladder   . PONV (postoperative nausea and vomiting)    used scop patch 7/14-please use again per pt request  . Sleep apnea    uses CPAP nightly    Past Surgical History:  Procedure Laterality Date  . ANKLE ARTHROSCOPY Left   . BREAST RECONSTRUCTION  05/24/2012    Procedure: BREAST RECONSTRUCTION;  Surgeon: Cristine Polio, MD;  Location: [place];  Service: Plastics;  Laterality: Right;  NO FLEX HD  . BREAST RECONSTRUCTION Right 01/17/2013   Procedure: RIGHT BREAST RECONSTRUCTION WITH PLACEMENT OF TISSUE EXPANDER ;  Surgeon: Cristine Polio, MD;  Location: [place];  Service: Plastics;  Laterality: Right;  . BREAST REDUCTION SURGERY Left 02/28/2013   Procedure: LEFT BREAST REDUCTION  (BREAST);  Surgeon: Cristine Polio, MD;  Location: [place];  Service: Plastics;  Laterality: Left;  . CESAREAN SECTION    . MASTECTOMY Right 03/2012  . REMOVAL OF TISSUE EXPANDER AND PLACEMENT OF IMPLANT Right 05/10/2013   Procedure: RIGHT REMOVAL OF TISSUE EXPANDER ;  Surgeon: Cristine Polio, MD;  Location: [place];  Service: Plastics;  Laterality: Right;  . SHOULDER ARTHROSCOPY Right   . TISSUE EXPANDER PLACEMENT  05/24/2012   Procedure: TISSUE EXPANDER;  Surgeon: Cristine Polio, MD;  Location: [place];  Service: Plastics;  Laterality: Right;    Current Outpatient Prescriptions  Medication Sig Dispense Refill  . benazepril-hydrochlorthiazide (LOTENSIN HCT) 20-12.5 MG per tablet Take 1 tablet by mouth daily.    . clonazePAM (KLONOPIN) 1 MG tablet Take 0.5 mg by mouth at bedtime as needed.     Marland Kitchen dexlansoprazole (DEXILANT) 60 MG capsule Take 1 capsule by mouth daily.    . DULoxetine (CYMBALTA) 60 MG capsule Take 60 mg by mouth daily.    Marland Kitchen liothyronine (CYTOMEL) 5 MCG tablet Take by mouth daily. Total of 54mcg/day (takes 63mcg in morning and 17mcg at night)    . methadone (DOLOPHINE) 5 MG tablet Take 5 mg by mouth as needed.    Melynda Ripple Tosylate (SYMPROIC) 0.2 MG TABS Take 1 tablet by mouth daily. ONLY TAKE WHILE USING OPIOIDS 30 tablet 5  . oxybutynin (DITROPAN-XL) 10 MG 24 hr tablet Take 5 mg by mouth 2 (two) times daily. 5mg     . potassium chloride (K-DUR) 10 MEQ tablet Take 1  tablet by mouth daily.    Marland Kitchen PRESCRIPTION MEDICATION Cyanocobalam inj 1088mcg once weekly    . sitaGLIPtin-metformin (JANUMET) 50-1000 MG tablet Take 1 tablet by mouth at bedtime.    . traZODone (DESYREL) 50 MG tablet Take 50 mg by mouth at bedtime.     No current facility-administered medications for this visit.     Allergies as of 04/08/2017 - Review Complete 04/08/2017  Allergen Reaction Noted  . Soap Other (See Comments) 02/22/2013  . Latex Itching 02/22/2013  . Sulfa antibiotics Rash 05/20/2012    Family History  Problem Relation Age of Onset  . Colon cancer Paternal Grandmother   . Colon cancer Paternal Grandfather     Social History   Social History  . Marital status: Married    Spouse name: N/A  . Number of children: N/A  . Years of education: N/A   Social History Main Topics  . Smoking status: Never Smoker  . Smokeless tobacco: Never Used  . Alcohol use No  . Drug use:  No  . Sexual activity: Not on file   Other Topics Concern  . Not on file   Social History Narrative  . No narrative on file    Review of Systems: Complete ROS negative except as per HPI.   Physical Exam: BP 133/68   Pulse 75   Temp (!) 97.5 F (36.4 C) (Oral)   Ht 5\' 6"  (1.676 m)   Wt 212 lb 6.4 oz (96.3 kg)   LMP 05/20/2012   BMI 34.28 kg/m  General:   Alert and oriented. Pleasant and cooperative. Well-nourished and well-developed.  Eyes:  Without icterus, sclera clear and conjunctiva pink.  Ears:  Normal auditory acuity. Cardiovascular:  S1, S2 present without murmurs appreciated. Extremities without clubbing or edema. Respiratory:  Clear to auscultation bilaterally. No wheezes, rales, or rhonchi. No distress.  Gastrointestinal:  +BS, soft, and non-distended. No HSM noted. No guarding or rebound.   Rectal:  Deferred  Musculoskalatal:  Symmetrical without gross deformities. Neurologic:  Alert and oriented x4;  grossly normal neurologically. Psych:  Alert and cooperative. Normal  mood and affect. Heme/Lymph/Immune: No excessive bruising noted.    04/08/2017 1:58 PM   Disclaimer: This note was dictated with voice recognition software. Similar sounding words can inadvertently be transcribed and may not be corrected upon review.

## 2017-04-08 NOTE — Assessment & Plan Note (Signed)
Abdominal pain improved with her large bowel movement. She did have another episode of abdominal pain for which she presented to the emergency room but was found to be just gas. Her abdominal pain is persistent but tolerable. I feel with more productive bowel movements this will likely continue to improve. Further constipation management as per above. Return for follow-up in 3 months.

## 2017-04-08 NOTE — Assessment & Plan Note (Signed)
There is still no resolution as to whether or not she has Barrett's esophagus. She was previously told that she does and then a more recent endoscopy said she didn't however the surgical pathology was reviewed and there is no comment on the presence or absence of Barrett's esophagus. At this point we will assume that she could have Barrett's esophagus. She should remain on Dexilant daily, indefinitely. We will set her up for 3 year recall endoscopy to further evaluate and definitively answer whether or not she does have Barrett's esophagus.

## 2017-04-08 NOTE — Progress Notes (Signed)
cc'ed to pcp °

## 2017-04-08 NOTE — Patient Instructions (Signed)
1. Continue to take Symproic every day. 2. Start taking Colace over-the-counter stool softener 100 mg, twice a day. 3. Call in 1 week and let me know if this is helping you have more productive bowel movements and Symproic alone. 4. We will set you up for a recall endoscopy in 3 years to try to definitively answer whether or not you have Barrett's esophagus. 5. In the meantime, continue taking Dexilant indefinitely. 6. Return for follow-up in 3 months.

## 2017-05-11 ENCOUNTER — Telehealth: Payer: Self-pay | Admitting: Gastroenterology

## 2017-05-11 DIAGNOSIS — K5900 Constipation, unspecified: Secondary | ICD-10-CM

## 2017-05-11 NOTE — Telephone Encounter (Signed)
PT said she has not had a BM for 2 weeks.  She is taking the Symproic daily and colace 200 mg daily. She is having some abdominal pain in her RLQ area. ( FYI....she is having MRI of her brain on Friday).  She was last in the office on 04/08/2017 and saw Walden Field, NP. She is aware he is out of the office this afternoon and I will send the message to Roseanne Kaufman, NP for advise!

## 2017-05-11 NOTE — Telephone Encounter (Signed)
Patient called wanting [place] to know that she has not gone to the bathroom in two weeks, taking cymbroric and colander.  [phone]

## 2017-05-11 NOTE — Telephone Encounter (Signed)
LMOM to call.

## 2017-05-11 NOTE — Telephone Encounter (Signed)
Go ahead and take Miralax dose now and then again this evening if no results. Further recommendations per Randall Hiss tomorrow.

## 2017-05-11 NOTE — Telephone Encounter (Signed)
PT is aware.

## 2017-05-12 ENCOUNTER — Telehealth: Payer: Self-pay | Admitting: Gastroenterology

## 2017-05-12 NOTE — Telephone Encounter (Signed)
As long as she is not having any alarm symptoms such as N/V, abdominal distension, then take Miralax one dose every hour up to 5 doses. Follow each dose with 8 ounces of water.   I will not be here to address anything else started 9/26. Please address with Randall Hiss first thing on 9/26.

## 2017-05-12 NOTE — Telephone Encounter (Signed)
Pt called to say the Miralax did not help at all, just made her gassy. She said her neurologist feels it could be auto immune related. He is wanting to refer her to Rheumatologist and she does not like the one in [place]. She thought there was one in [place] but I am not familiar with one. She is not opposed to going to [place] if necessary. Please advise! Forwarding to Vicente Males again in [place] absence this afternoon .

## 2017-05-12 NOTE — Telephone Encounter (Signed)
Pt called back and said she has had lower back pain so bad sometimes she can hardly walk. She has had x rays and her back is OK. She said she had one episode of really bad nausea and vomiting on Sat, So I told her to just hold off until Walden Field, NP addresses this tomorrow AM.

## 2017-05-12 NOTE — Telephone Encounter (Signed)
Pt is aware and aware that we will await Eric's input about the referral.

## 2017-05-12 NOTE — Telephone Encounter (Signed)
LMOM for a return call.  

## 2017-05-12 NOTE — Telephone Encounter (Signed)
Pt said that she talked with DS yesterday and wanted her to know that she tried the recommendations and they did nothing for her. Please advise and call her back at [phone]

## 2017-05-12 NOTE — Telephone Encounter (Signed)
LMOM to call.

## 2017-05-12 NOTE — Telephone Encounter (Signed)
See note dated 05/11/2017.

## 2017-05-13 ENCOUNTER — Ambulatory Visit (HOSPITAL_COMMUNITY)
Admission: RE | Admit: 2017-05-13 | Discharge: 2017-05-13 | Disposition: A | Discharge status: Home / Self Care | Payer: BLUE CROSS/BLUE SHIELD | Source: Ambulatory Visit | Attending: Nurse Practitioner | Admitting: Nurse Practitioner

## 2017-05-13 DIAGNOSIS — M549 Dorsalgia, unspecified: Secondary | ICD-10-CM | POA: Insufficient documentation

## 2017-05-13 DIAGNOSIS — K5900 Constipation, unspecified: Secondary | ICD-10-CM | POA: Insufficient documentation

## 2017-05-13 NOTE — Telephone Encounter (Signed)
PT is aware and will go for the X-ray this afternoon when she gets off of work.  She did ask about the Rheumatology Referral and I told her that I will address with Randall Hiss tomorrow.  I called [facility] and spoke to [place] who said pt is probably safe to come this afternoon as long as she is there by 5:30 for the Xray.

## 2017-05-13 NOTE — Addendum Note (Signed)
Addended by: Gordy Levan, ERIC A on: 05/13/2017 12:59 PM   Modules accepted: Orders

## 2017-05-13 NOTE — Telephone Encounter (Signed)
She has tried and failed essentially every constipation medication. Abdominal XRay 2 view for obstruction. If no obstruction, try a bowel prep for bowel movement. If severe symptoms develop, proceed to the ER. I will enter XRay order

## 2017-05-13 NOTE — Telephone Encounter (Signed)
LMOM to call.

## 2017-05-14 MED ORDER — PEG 3350-KCL-NA BICARB-NACL 420 G PO SOLR: 4000.0000 mL | ORAL | 0 refills | Status: DC

## 2017-05-14 NOTE — Telephone Encounter (Signed)
If her neurologist would like to refer her to Rhematology I don't see what input we would have in this. She can choose which one she would like to see and her neurologist can make the referral.

## 2017-05-14 NOTE — Addendum Note (Signed)
Addended by: Everardo All on: 05/14/2017 02:11 PM   Modules accepted: Orders

## 2017-05-14 NOTE — Telephone Encounter (Signed)
Pt is aware to discuss the referral with the neurologist.  See result note for instructions.

## 2017-05-14 NOTE — Progress Notes (Signed)
I have spoke to pt and she is aware. Rx sent in and instructions faxed to [facility] per her request. She lives in [place] and did not want to pick prep here.

## 2017-08-04 ENCOUNTER — Ambulatory Visit: Payer: BLUE CROSS/BLUE SHIELD | Admitting: Nurse Practitioner

## 2017-08-04 ENCOUNTER — Encounter: Payer: Self-pay | Admitting: Nurse Practitioner

## 2017-08-04 VITALS — BP 131/68 | HR 66 | Temp 97.5°F | Ht 66.0 in | Wt 216.0 lb

## 2017-08-04 DIAGNOSIS — K5900 Constipation, unspecified: Secondary | ICD-10-CM

## 2017-08-04 DIAGNOSIS — M3500 Sicca syndrome, unspecified: Secondary | ICD-10-CM | POA: Diagnosis not present

## 2017-08-04 DIAGNOSIS — R109 Unspecified abdominal pain: Secondary | ICD-10-CM | POA: Diagnosis not present

## 2017-08-04 DIAGNOSIS — K219 Gastro-esophageal reflux disease without esophagitis: Secondary | ICD-10-CM | POA: Diagnosis not present

## 2017-08-04 NOTE — Assessment & Plan Note (Signed)
Abdominal pain is significantly improved with subsequent improvement in bowel movements.  Continue medications.  Follow-up in 6 months.

## 2017-08-04 NOTE — Progress Notes (Signed)
Referring Provider: Donalynn Furlong, * Primary Care Physician:  Donalynn Furlong, MD Primary GI:  Dr. Oneida Alar  Chief Complaint  Patient presents with  . Abdominal Pain    f/u, doing ok    HPI:   Heidi [name] is a 61 y.o. female who presents follow-up on constipation and abdominal pain.  Patient was last seen in our office 04/08/2017 for the same as well as Barrett's esophagus.  History of breast cancer and significant weight loss while undergoing treatment.  Has been on Linzess and Amitiza which did not help.  Trulance also did not help.  Had also failed Movantik.  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  She called to say this was working well and she was given a prescription.  At last visit she was doing well overall and Symproic generally controlled her symptoms.  Typically has 2-3 bowel movements a day but still a sensation of incomplete emptying.  No other GI symptoms.  GERD was well controlled on Dexilant at that time.  Recommended continue Symproic, Colace 100 mg twice a day.  Due for recall endoscopy in 3 years for Barrett's surveillance.  Continue Dexilant indefinitely due to question of possible Barrett's based on previous biopsies.  Follow-up in 3 months.  Per records from previous GI: EGD with dilation was performed on 11/13/2016 which found hiatal hernia, irregular Z line status post biopsy, multiple 2-3 mm polyps in the body of the stomach and about a 1.5-2 cm polyp in the antrum, status post biopsy. Duodenum biopsy taken to rule out celiac disease. Duodenal biopsy with no pathologic change. Stomach biopsy with mild chronic gastritis and no Helicobacter pylori. Noted chronic cardiac gastritis and esophagitis. No comment related to Barrett's esophagus. The patient did undergo esophageal biopsy at that time as well.   Today she states she's doing well overall. Symproic continues to work well for her. Stools are soft/mushy, no  straining. Denies hematochezia, melena. Abdominal pain improved with regular bowel movements. Has recently been diagnosed with Sjogren's disease. Question of possible lupus. Has compression fractures x 3 and spinal stenosis. Is on Clindamycin for recently reopened breast CA drainage tract, set to see ID. Denies N/V. Has had significant chest pain and was seen in the ER at [facility]; was d/c on Protonix (even though she's on Dexilant). Is having breakthrough GERD 3-4 times a week. Has been under significant stress lately. Denies dyspnea, dizziness, lightheadedness, syncope, near syncope. Denies any other upper or lower GI symptoms.  Past Medical History:  Diagnosis Date  . Anemia    takes iron supplement  . Anxiety   . Arthritis   . Asthma    prn neb.  . Cancer [facility])    right breast cancer  . Depression   . Diabetes mellitus    IDDM  . Environmental allergies   . GERD (gastroesophageal reflux disease)   . Headache(784.0)    migraines  . History of MRSA infection 08/2012   right breast  . Hypertension    under control with med., has been on med. x 10-15 yr.  . Overactive bladder   . PONV (postoperative nausea and vomiting)    used scop patch 7/14-please use again per pt request  . Sjogren's disease ([place])   . Sleep apnea    uses CPAP nightly    Past Surgical History:  Procedure Laterality Date  . ANKLE ARTHROSCOPY Left   . BREAST RECONSTRUCTION  05/24/2012   Procedure: BREAST RECONSTRUCTION;  Surgeon: Cristine Polio, MD;  Location: [place];  Service: Plastics;  Laterality: Right;  NO FLEX HD  . BREAST RECONSTRUCTION Right 01/17/2013   Procedure: RIGHT BREAST RECONSTRUCTION WITH PLACEMENT OF TISSUE EXPANDER ;  Surgeon: Cristine Polio, MD;  Location: [place];  Service: Plastics;  Laterality: Right;  . BREAST REDUCTION SURGERY Left 02/28/2013   Procedure: LEFT BREAST REDUCTION  (BREAST);  Surgeon: Cristine Polio, MD;  Location: [place];  Service: Plastics;  Laterality: Left;  . CESAREAN SECTION    . MASTECTOMY Right 03/2012  . REMOVAL OF TISSUE EXPANDER AND PLACEMENT OF IMPLANT Right 05/10/2013   Procedure: RIGHT REMOVAL OF TISSUE EXPANDER ;  Surgeon: Cristine Polio, MD;  Location: [place];  Service: Plastics;  Laterality: Right;  . SHOULDER ARTHROSCOPY Right   . TISSUE EXPANDER PLACEMENT  05/24/2012   Procedure: TISSUE EXPANDER;  Surgeon: Cristine Polio, MD;  Location: [place];  Service: Plastics;  Laterality: Right;    Current Outpatient Medications  Medication Sig Dispense Refill  . benazepril-hydrochlorthiazide (LOTENSIN HCT) 20-12.5 MG per tablet Take 1 tablet by mouth daily.    . clindamycin (CLEOCIN) 300 MG capsule Take 1 capsule by mouth 3 (three) times daily.    . clonazePAM (KLONOPIN) 1 MG tablet Take 0.5 mg by mouth at bedtime as needed.     Marland Kitchen dexlansoprazole (DEXILANT) 60 MG capsule Take 1 capsule by mouth daily.    . DULoxetine (CYMBALTA) 60 MG capsule Take 60 mg by mouth daily.    Marland Kitchen liothyronine (CYTOMEL) 5 MCG tablet Take by mouth daily. Total of 66mcg/day (takes 58mcg in morning and 62mcg at night)    . metFORMIN (GLUCOPHAGE) 500 MG tablet Take 1 tablet by mouth 2 (two) times daily.    . methadone (DOLOPHINE) 5 MG tablet Take 5 mg by mouth as needed.    Melynda Ripple Tosylate (SYMPROIC) 0.2 MG TABS Take 1 tablet by mouth daily. ONLY TAKE WHILE USING OPIOIDS 30 tablet 5  . oxybutynin (DITROPAN-XL) 10 MG 24 hr tablet Take 5 mg by mouth 2 (two) times daily. 5mg     . potassium chloride (K-DUR) 10 MEQ tablet Take 1 tablet by mouth daily.    Marland Kitchen PRESCRIPTION MEDICATION Cyanocobalam inj 1079mcg once weekly    . sitaGLIPtin-metformin (JANUMET) 50-1000 MG tablet Take 1 tablet by mouth at bedtime.    . traZODone (DESYREL) 50 MG tablet Take 50 mg by mouth at bedtime.     No current facility-administered medications for this visit.     Allergies as of 08/04/2017 - Review  Complete 08/04/2017  Allergen Reaction Noted  . Soap Other (See Comments) 02/22/2013  . Latex Itching 02/22/2013  . Sulfa antibiotics Rash 05/20/2012    Family History  Problem Relation Age of Onset  . Colon cancer Paternal Grandmother   . Colon cancer Paternal Grandfather     Social History   Socioeconomic History  . Marital status: Married    Spouse name: None  . Number of children: None  . Years of education: None  . Highest education level: None  Social Needs  . Financial resource strain: None  . Food insecurity - worry: None  . Food insecurity - inability: None  . Transportation needs - medical: None  . Transportation needs - non-medical: None  Occupational History  . None  Tobacco Use  . Smoking status: Never Smoker  . Smokeless tobacco: Never Used  Substance and Sexual Activity  . Alcohol  use: No  . Drug use: No  . Sexual activity: None  Other Topics Concern  . None  Social History Narrative  . None    Review of Systems: Complete ROS negative except as per HPI.  Physical Exam: BP 131/68   Pulse 66   Temp (!) 97.5 F (36.4 C) (Oral)   Ht 5\' 6"  (1.676 m)   Wt 216 lb (98 kg)   LMP 05/20/2012   BMI 34.86 kg/m  General:   Obese female. Alert and oriented. Pleasant and cooperative. Well-nourished and well-developed.  Eyes:  Without icterus, sclera clear and conjunctiva pink.  Ears:  Normal auditory acuity. Cardiovascular:  S1, S2 present without murmurs appreciated. Extremities without clubbing or edema. Respiratory:  Clear to auscultation bilaterally. No wheezes, rales, or rhonchi. No distress.  Gastrointestinal:  +BS, soft, non-tender and non-distended. No HSM noted. No guarding or rebound. No masses appreciated.  Rectal:  Deferred  Musculoskalatal:  Symmetrical without gross deformities. Neurologic:  Alert and oriented x4;  grossly normal neurologically. Psych:  Alert and cooperative. Normal mood and affect. Heme/Lymph/Immune: No excessive bruising  noted.    08/04/2017 10:21 AM   Disclaimer: This note was dictated with voice recognition software. Similar sounding words can inadvertently be transcribed and may not be corrected upon review.

## 2017-08-04 NOTE — Patient Instructions (Signed)
1. I have sent a prescription to your pharmacy for Zantac 150 mg to take every other evening. 2. Call us if this does not help your breakthrough heartburn symptoms.  At that time we can consider adding thick lidocaine solution. 3. Continue your other medications. 4. Return for follow-up in 6 months. 5. Call if you have any questions or concerns.

## 2017-08-04 NOTE — Assessment & Plan Note (Signed)
The patient is having some worsening GERD symptoms at this point.  The emergency department evaluated her and gave her Protonix prescription even though she is already on Dexilant.  Recommend she not take Protonix.  Continue Dexilant, add Zantac every other evening.  If no improvement, we can consider viscous lidocaine solution.  Continue other current medications, return for follow-up in 6 months.

## 2017-08-04 NOTE — Assessment & Plan Note (Signed)
Patient has a history of significant constipation.  Symproic seems to be working well for her.  Continue Symproic, return for follow-up in 6 months.  Call if any worsening or recurrent symptoms before then.

## 2017-08-04 NOTE — Assessment & Plan Note (Signed)
The patient indicates she was recently diagnosed with Sjogren's syndrome.  She does have dyspepsia and GERD symptoms, she is on a PPI and we were be adding further treatments as per below.  No clinical symptoms of pancreatic or liver disease.  No clinical symptoms of celiac disease.  Recent CMP found normal transaminases and liver function studies.  Continue to monitor and if she begins to manifest any extraglandular symptoms we can further evaluate at that time.  Follow-up in 6 months.

## 2017-08-04 NOTE — Progress Notes (Signed)
cc'd to pcp 

## 2017-08-05 ENCOUNTER — Telehealth: Payer: Self-pay | Admitting: Gastroenterology

## 2017-08-05 NOTE — Telephone Encounter (Signed)
Pt was seen by EG yesterday and she has questions about what meds to take and which ones to avoid. Please call her back at [phone]

## 2017-08-05 NOTE — Telephone Encounter (Signed)
PT called and said that Heidi Field, NP, told her to avoid ASA and Ibuprofen and she was just making sure that she could not take any ASA products. I told her that was because of her worsening GERD and she should avoid all of the NSAIDS.She said she had been taking some Ibuprofen on occasions for her arthritis, but she will stop that and she will ask her Rheumatologist for recommendation. She just wanted to make sure she follows instructions by Heidi [name].

## 2017-08-06 ENCOUNTER — Ambulatory Visit: Payer: BLUE CROSS/BLUE SHIELD | Admitting: Nurse Practitioner

## 2017-08-07 NOTE — Telephone Encounter (Signed)
Noted, thanks for the followup. No further recommendations at this time.

## 2017-09-28 ENCOUNTER — Other Ambulatory Visit: Payer: Self-pay | Admitting: Nurse Practitioner

## 2018-02-02 ENCOUNTER — Ambulatory Visit: Payer: BLUE CROSS/BLUE SHIELD | Admitting: Gastroenterology

## 2018-02-15 ENCOUNTER — Telehealth: Payer: Self-pay | Admitting: *Deleted

## 2018-02-15 NOTE — Telephone Encounter (Signed)
Received fax from pharmacy that Symproic 0.2mg  is not covered.

## 2018-02-15 NOTE — Telephone Encounter (Signed)
PA has been started for Symproic.

## 2018-02-24 NOTE — Telephone Encounter (Signed)
Received info Symproic has been apporved through 02/15/2019. Faxed the info to [place].

## 2018-02-26 NOTE — Telephone Encounter (Signed)
Noted  

## 2018-04-23 ENCOUNTER — Ambulatory Visit: Payer: BLUE CROSS/BLUE SHIELD | Admitting: Gastroenterology

## 2018-09-28 IMAGING — DX DG ABDOMEN 2V
3 series · 3 of 3 positions shown · non-contrast
Comparison: None.

CLINICAL DATA: Lower abdominal pain for several weeks

EXAM:
ABDOMEN - 2 VIEW

[abdomen erect]
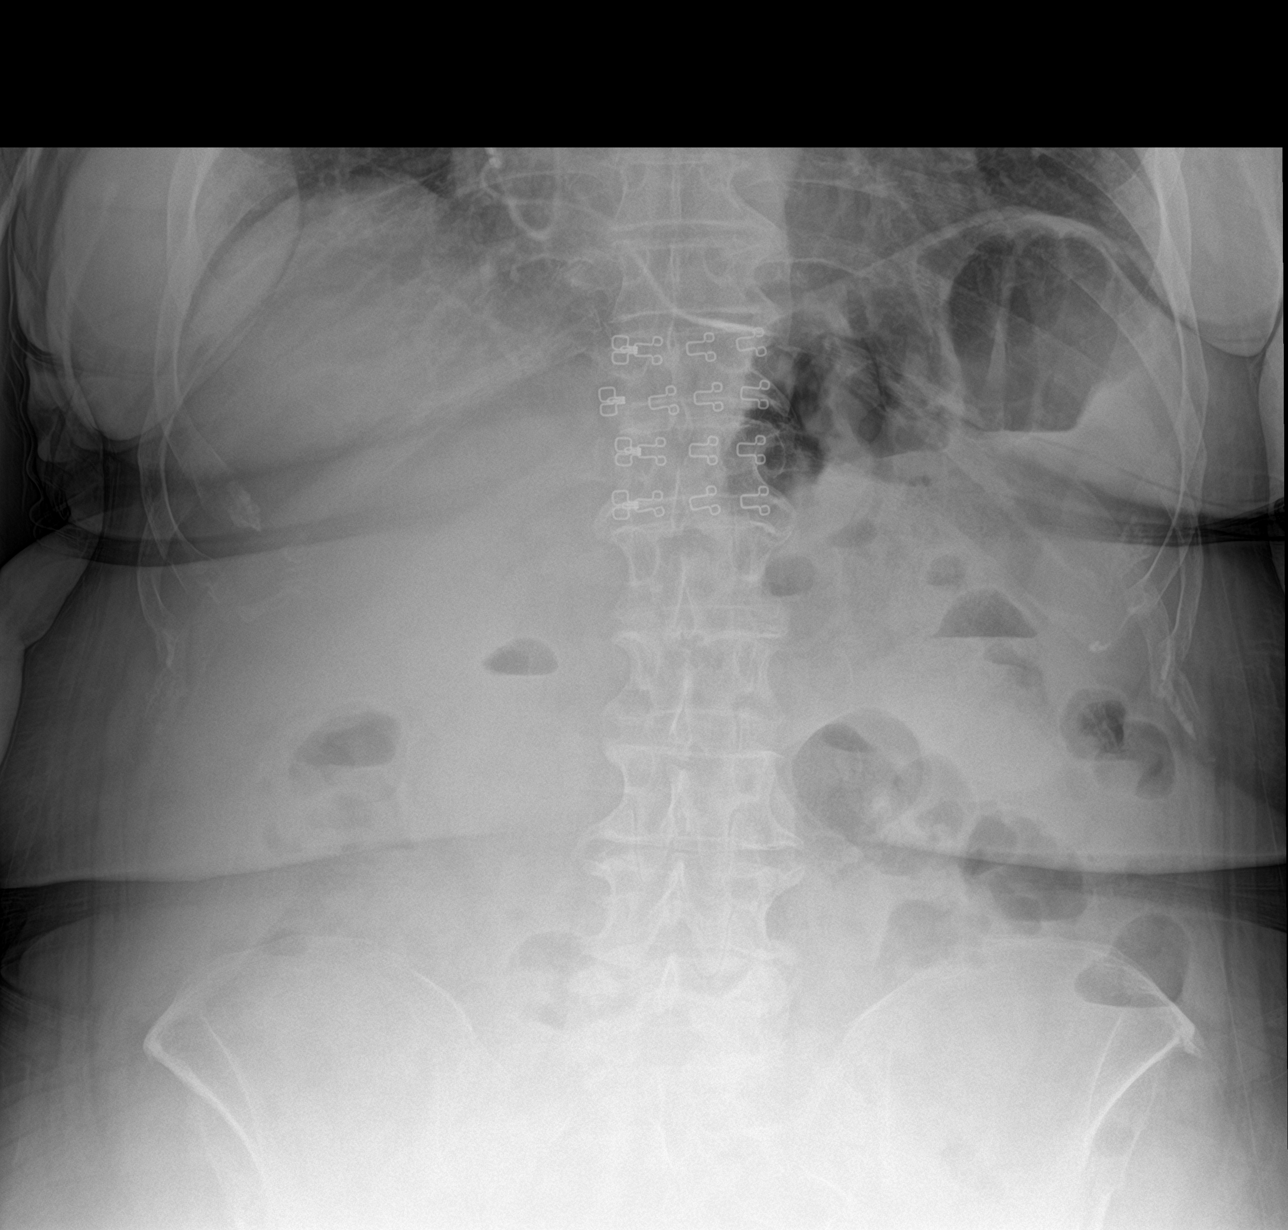

[abdomen supine (1 of 2)]
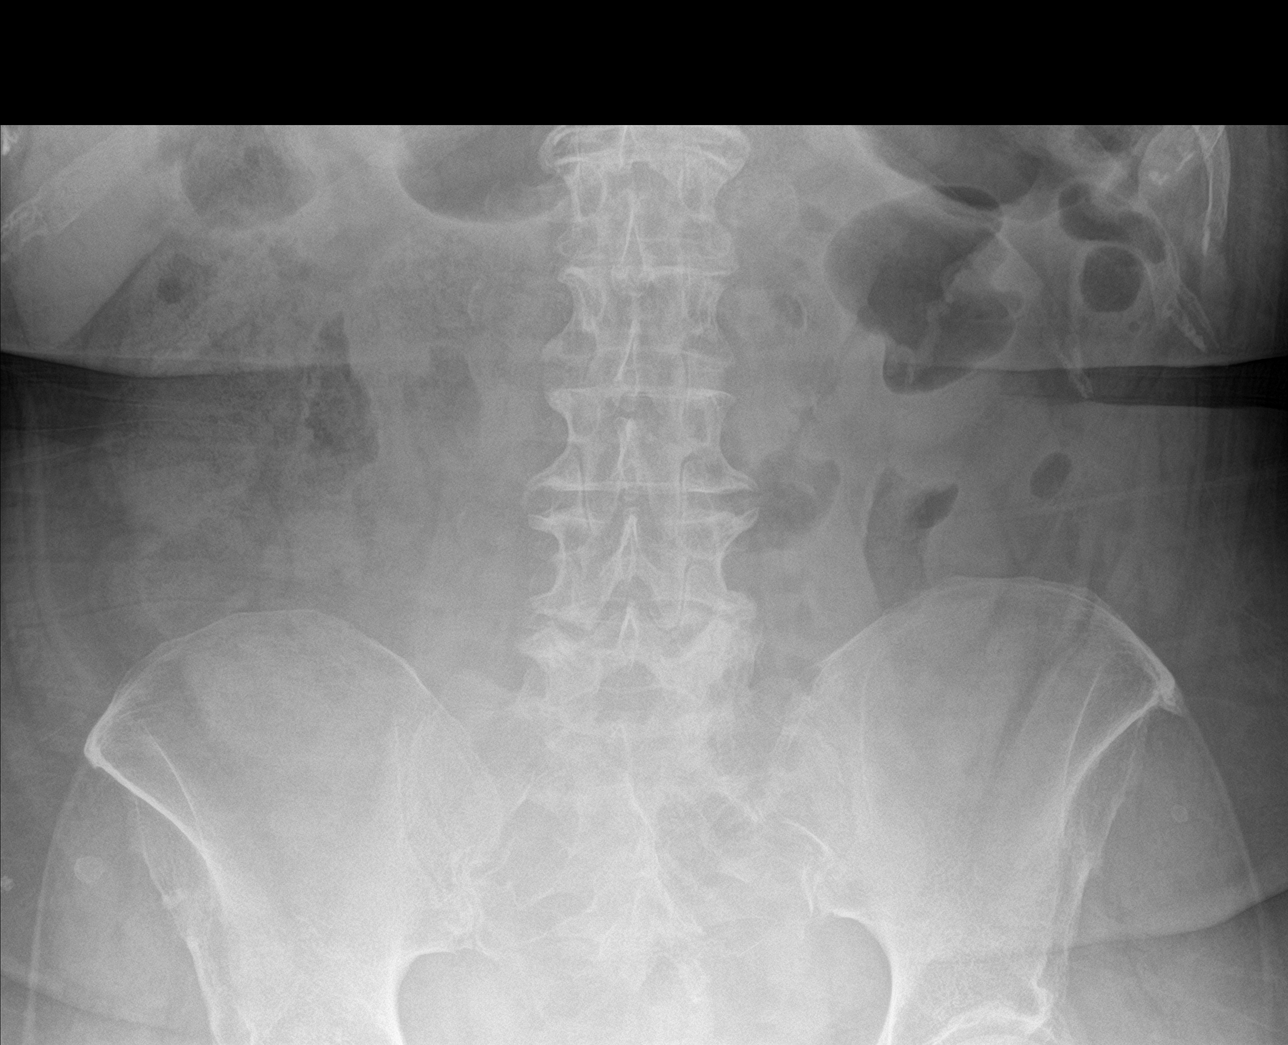

[abdomen supine (2 of 2)]
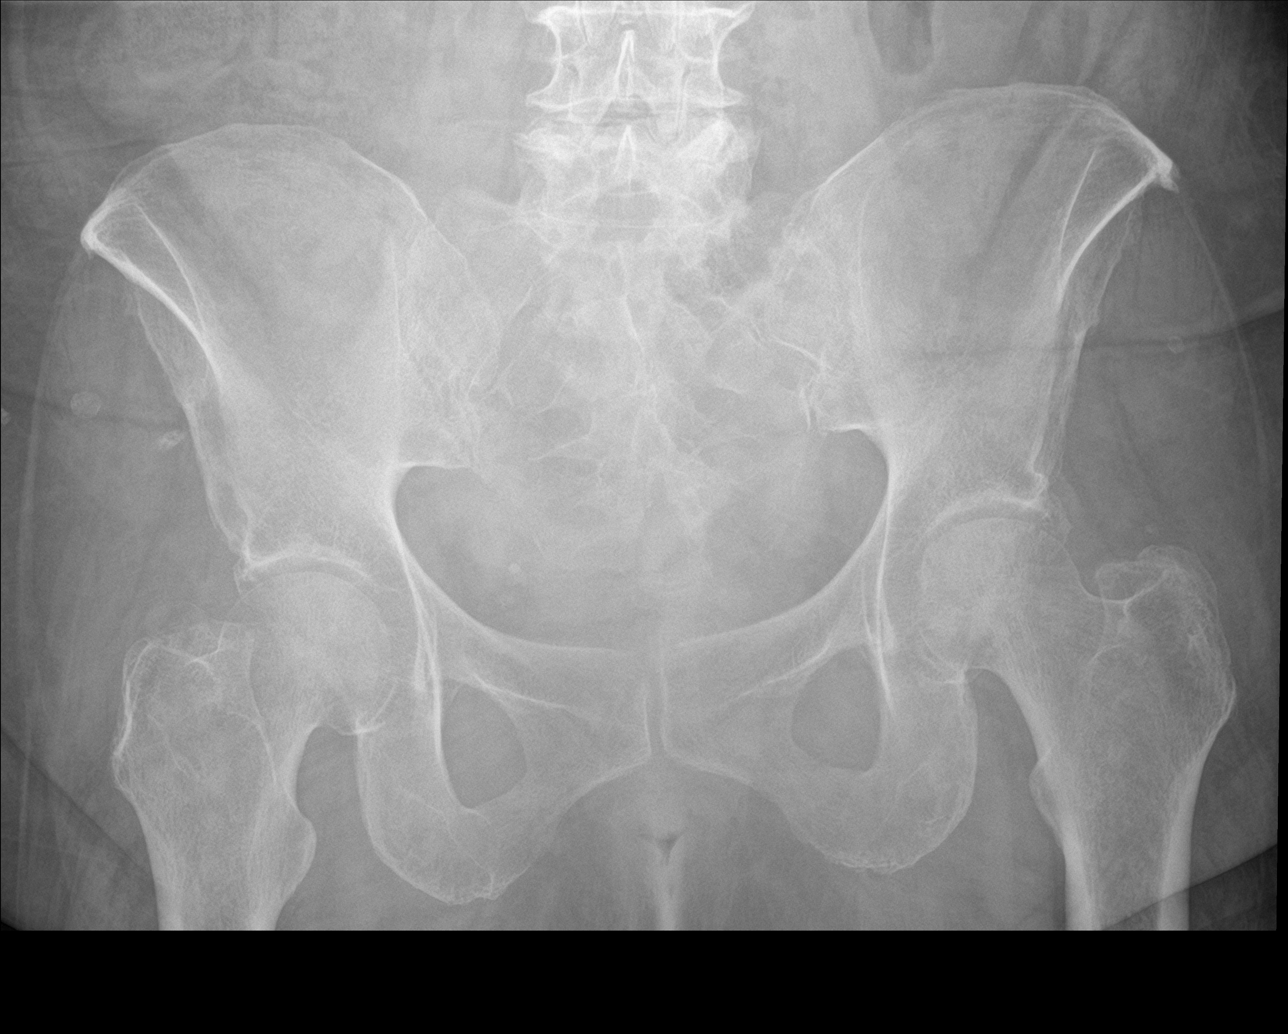

[3 of 3 positions shown; findings below may reference images not displayed]

FINDINGS: Scattered large and small bowel gas is noted. No obstructive changes
are seen. Mild retained fecal material is noted consistent with a
mild degree of constipation. Degenerative changes of lumbar spine
are noted. No acute bony abnormality is seen.
IMPRESSION: Changes suggestive of mild constipation.

No other focal abnormality noted.

## 2020-02-21 ENCOUNTER — Encounter: Payer: Self-pay | Admitting: Nurse Practitioner

## 2020-04-06 ENCOUNTER — Ambulatory Visit: Payer: Self-pay | Admitting: Gastroenterology

## 2020-04-10 ENCOUNTER — Encounter: Payer: Self-pay | Admitting: *Deleted

## 2020-04-10 ENCOUNTER — Telehealth: Payer: Self-pay | Admitting: *Deleted

## 2020-04-10 ENCOUNTER — Ambulatory Visit: Payer: Medicare PPO | Admitting: Nurse Practitioner

## 2020-04-10 ENCOUNTER — Other Ambulatory Visit: Payer: Self-pay

## 2020-04-10 ENCOUNTER — Encounter: Payer: Self-pay | Admitting: Nurse Practitioner

## 2020-04-10 VITALS — BP 131/71 | HR 89 | Temp 97.0°F | Ht 66.0 in | Wt 273.2 lb

## 2020-04-10 DIAGNOSIS — K219 Gastro-esophageal reflux disease without esophagitis: Secondary | ICD-10-CM

## 2020-04-10 DIAGNOSIS — D509 Iron deficiency anemia, unspecified: Secondary | ICD-10-CM | POA: Diagnosis not present

## 2020-04-10 DIAGNOSIS — R195 Other fecal abnormalities: Secondary | ICD-10-CM | POA: Insufficient documentation

## 2020-04-10 DIAGNOSIS — D500 Iron deficiency anemia secondary to blood loss (chronic): Secondary | ICD-10-CM | POA: Insufficient documentation

## 2020-04-10 NOTE — Progress Notes (Signed)
The patient retrieved results from EMR with her PCP. Ferritin 12, iron sat 7, CRP 13 (has Sjogren's syndrome), hemoglobin 9.6.  We will still request for records.

## 2020-04-10 NOTE — Telephone Encounter (Signed)
Called pt. She has been scheduled for EGD with propofol with Dr. Abbey Chatters 9/28 at 3:00pm. Patient aware will need pre-op/covid test. Advised will mail with prep instructions. Orders entered.   PA approved via [employer]. Auth# [id number] dates 05/15/2020-06/14/2020

## 2020-04-10 NOTE — Progress Notes (Signed)
CC'ED TO PCP 

## 2020-04-10 NOTE — Patient Instructions (Addendum)
Your health issues we discussed today were:   Iron deficiency anemia with dark stools: 1. As we discussed, with dark stools your blood loss is likely coming from your stomach/esophagus 2. We will plan for an upper endoscopy to further evaluate 3. Further recommendations will follow 4. We may need to eventually complete a colonoscopy, depending on the results from your upper endoscopy 5. Call us for any worsening or significant symptoms such as worsening bleeding, lightheadedness, dizziness, weakness, passing out, nearly passing out, shortness of breath, chest pain  GERD: 1. Increase your Protonix to twice daily 2. You can also use Pepcid in the evenings 3. Let us know if you do not have any improvement and we can consider changing to a different acid blocker  Overall I recommend:  1. Your other current medications 2. Return for follow-up and 2 months 3. Call us if you have any questions or concerns   ---------------------------------------------------------------  I am glad you have gotten your COVID-19 vaccination!  Even though you are fully vaccinated you should continue to follow CDC and state/local guidelines.  ---------------------------------------------------------------   At [facility] we value your feedback. You may receive a survey about your visit today. Please share your experience as we strive to create trusting relationships with our patients to provide genuine, compassionate, quality care.  We appreciate your understanding and patience as we review any laboratory studies, imaging, and other diagnostic tests that are ordered as we care for you. Our office policy is 5 business days for review of these results, and any emergent or urgent results are addressed in a timely manner for your best interest. If you do not hear from our office in 1 week, please contact us.   We also encourage the use of MyChart, which contains your medical information for your  review as well. If you are not enrolled in this feature, an access code is on this after visit summary for your convenience. Thank you for allowing [place] to be involved in your care.  It was great to see you today!  I hope you have a great rest of your summer!!

## 2020-04-10 NOTE — Progress Notes (Signed)
Referring Provider: Donalynn Furlong, * Primary Care Physician:  Wendi Maya, NP Primary GI:  Dr. Abbey Chatters  Chief Complaint  Patient presents with  . Anemia    Low iron, hgb    HPI:   Heidi [name] is a 64 y.o. female who presents on referral from primary care for abnormal labs. The patient was last seen in our office 08/04/2017 for constipation, abdominal pain, GERD, Sjogren's syndrome.  Noted history of Barrett's esophagus, breast cancer.  Has trialed and failed multiple constipation medications including Linzess, Amitiza, Trulance, Movantik.  She finally found success with Symproic.  Reviewed information related to the referral including referral notes indicating IDA with last colonoscopy 2017.  Office visit dated 11/25/2019 indicates history of GERD doing well on PPI.  History of B12 deficiency currently on injection replacement.  Reviewed PCP labs including BMP with normal creatinine 0.77, CMP with normal LFTs other than mildly elevated alkaline phosphatase at 133.  CBC found low hemoglobin 10.1 which was microcytic and hypochromic.  Iron studies found low saturation at 10%.  No ferritin levels.  Previous EGD with previous GI MD dated 11/13/2016 with hiatal hernia, multiple 2 to 3 mm polyps in the body of the stomach and a 1.5 to 2 cm polyp in the antrum status post biopsy.  Duodenal biopsies with no pathological changes and celiac disease ruled out.  Stomach biopsies with chronic gastritis no H. pylori.  No comment related to Barrett's esophagus.  At her last visit Symproic continues to be effective, abdominal pain improved with regular bowel movements.  Recent diagnosis of Sjogren's syndrome with question of possible lupus.  On clindamycin for recently reopened breast cancer drainage tract and plans to see ID.  Breakthrough GERD symptoms because when she was seen in the ED they changed her from [place] to Protonix.  No other overt GI complaints.  Recommended add Zantac  150 milligrams every other evening, call for worsening symptoms, follow-up in 6 months.  Recommended avoiding NSAIDs as well.  Today she states she is doing okay overall. She was told her iron and blood levels were low. Denies any obvious GI bleed. She did have a few episodes of post-menopausal vaginal bleeding which has been worked up; not significant in amount. Denies hematochezia. She does have black stools "kinda sticky looking." Denies iron supplementation. Uses Pepto Bismol rarely (last use 2 months ago). Persistent GERD symptoms. Not on Dexilant due to insurance. Protonix not effective. Zantac has been taken off the market. Has intermittent nausea. Denies abdominal pain, vomiting, fever, chills, unintentional weight loss. Denies URI or flu-like symptoms. Denies loss of sense of taste or smell. The patient has received COVID-19 vaccination(s). Denies chest pain, dyspnea, dizziness, lightheadedness, syncope, near syncope. Denies any other upper or lower GI symptoms.  Past Medical History:  Diagnosis Date  . Anemia    takes iron supplement  . Anxiety   . Arthritis   . Asthma    prn neb.  . Cancer [facility])    right breast cancer  . Depression   . Diabetes mellitus    IDDM  . Environmental allergies   . GERD (gastroesophageal reflux disease)   . Headache(784.0)    migraines  . History of MRSA infection 08/2012   right breast  . Hypertension    under control with med., has been on med. x 10-15 yr.  . Overactive bladder   . PONV (postoperative nausea and vomiting)    used scop patch 7/14-please use again per  pt request  . Sjogren's disease ([place])   . Sleep apnea    uses CPAP nightly    Past Surgical History:  Procedure Laterality Date  . ANKLE ARTHROSCOPY Left   . BREAST RECONSTRUCTION  05/24/2012   Procedure: BREAST RECONSTRUCTION;  Surgeon: Cristine Polio, MD;  Location: [place];  Service: Plastics;  Laterality: Right;  NO FLEX HD  . BREAST RECONSTRUCTION Right  01/17/2013   Procedure: RIGHT BREAST RECONSTRUCTION WITH PLACEMENT OF TISSUE EXPANDER ;  Surgeon: Cristine Polio, MD;  Location: [place];  Service: Plastics;  Laterality: Right;  . BREAST REDUCTION SURGERY Left 02/28/2013   Procedure: LEFT BREAST REDUCTION  (BREAST);  Surgeon: Cristine Polio, MD;  Location: [place];  Service: Plastics;  Laterality: Left;  . CESAREAN SECTION    . MASTECTOMY Right 03/2012  . REMOVAL OF TISSUE EXPANDER AND PLACEMENT OF IMPLANT Right 05/10/2013   Procedure: RIGHT REMOVAL OF TISSUE EXPANDER ;  Surgeon: Cristine Polio, MD;  Location: [place];  Service: Plastics;  Laterality: Right;  . SHOULDER ARTHROSCOPY Right   . TISSUE EXPANDER PLACEMENT  05/24/2012   Procedure: TISSUE EXPANDER;  Surgeon: Cristine Polio, MD;  Location: [place];  Service: Plastics;  Laterality: Right;    Current Outpatient Medications  Medication Sig Dispense Refill  . benazepril-hydrochlorthiazide (LOTENSIN HCT) 20-12.5 MG per tablet Take 1 tablet by mouth daily.    Marland Kitchen buPROPion (ZYBAN) 150 MG 12 hr tablet Take 150 mg by mouth daily.    . clonazePAM (KLONOPIN) 1 MG tablet Take 0.5 mg by mouth at bedtime as needed.     . DULoxetine (CYMBALTA) 60 MG capsule Take 60 mg by mouth in the morning and at bedtime.     . hydroxychloroquine (PLAQUENIL) 200 MG tablet Take 200 mg by mouth daily. Takes 2 tablets once daily.    Marland Kitchen LORazepam (ATIVAN) 2 MG tablet Take 2 mg by mouth at bedtime as needed for anxiety.    . metFORMIN (GLUCOPHAGE) 500 MG tablet Take 1,000 mg by mouth 2 (two) times daily.     . methadone (DOLOPHINE) 5 MG tablet Take 5 mg by mouth in the morning and at bedtime.     . modafinil (PROVIGIL) 200 MG tablet Take 200 mg by mouth daily.    Marland Kitchen oxybutynin (DITROPAN-XL) 10 MG 24 hr tablet Take 5 mg by mouth 2 (two) times daily. 5mg     . pantoprazole (PROTONIX) 40 MG tablet Take 40 mg by mouth daily.    . pregabalin (LYRICA)  50 MG capsule Take 50 mg by mouth in the morning and at bedtime.     No current facility-administered medications for this visit.    Allergies as of 04/10/2020 - Review Complete 04/10/2020  Allergen Reaction Noted  . Soap Other (See Comments) 02/22/2013  . Latex Itching 02/22/2013  . Sulfa antibiotics Rash 05/20/2012    Family History  Problem Relation Age of Onset  . Colon cancer Paternal Grandmother   . Colon cancer Paternal Grandfather     Social History   Socioeconomic History  . Marital status: Married    Spouse name: Not on file  . Number of children: Not on file  . Years of education: Not on file  . Highest education level: Not on file  Occupational History  . Not on file  Tobacco Use  . Smoking status: Never Smoker  . Smokeless tobacco: Never Used  Substance and Sexual Activity  . Alcohol use:  No  . Drug use: No  . Sexual activity: Not Currently  Other Topics Concern  . Not on file  Social History Narrative  . Not on file   Social Determinants of Health   Financial Resource Strain:   . Difficulty of Paying Living Expenses: Not on file  Food Insecurity:   . Worried About Charity fundraiser in the Last Year: Not on file  . Ran Out of Food in the Last Year: Not on file  Transportation Needs:   . Lack of Transportation (Medical): Not on file  . Lack of Transportation (Non-Medical): Not on file  Physical Activity:   . Days of Exercise per Week: Not on file  . Minutes of Exercise per Session: Not on file  Stress:   . Feeling of Stress : Not on file  Social Connections:   . Frequency of Communication with Friends and Family: Not on file  . Frequency of Social Gatherings with Friends and Family: Not on file  . Attends Religious Services: Not on file  . Active Member of Clubs or Organizations: Not on file  . Attends Archivist Meetings: Not on file  . Marital Status: Not on file    Subjective: Review of Systems  Constitutional: Negative for  chills, fever, malaise/fatigue and weight loss.  HENT: Negative for congestion and sore throat.   Respiratory: Negative for cough and shortness of breath.   Cardiovascular: Negative for chest pain and palpitations.  Gastrointestinal: Positive for heartburn, melena and nausea. Negative for abdominal pain, blood in stool, diarrhea and vomiting.  Musculoskeletal: Negative for joint pain and myalgias.  Skin: Negative for rash.  Neurological: Negative for dizziness and weakness.  Endo/Heme/Allergies: Does not bruise/bleed easily.  Psychiatric/Behavioral: Negative for depression. The patient is not nervous/anxious.   All other systems reviewed and are negative.    Objective: BP 131/71   Pulse 89   Temp (!) 97 F (36.1 C) (Oral)   Ht 5\' 6"  (1.676 m)   Wt 273 lb 3.2 oz (123.9 kg)   LMP 05/20/2012   BMI 44.10 kg/m  Physical Exam Vitals and nursing note reviewed.  Constitutional:      General: She is not in acute distress.    Appearance: Normal appearance. She is well-developed. She is obese. She is not ill-appearing, toxic-appearing or diaphoretic.  HENT:     Head: Normocephalic and atraumatic.     Nose: No congestion or rhinorrhea.  Eyes:     General: No scleral icterus. Cardiovascular:     Rate and Rhythm: Normal rate and regular rhythm.     Heart sounds: Normal heart sounds.  Pulmonary:     Effort: Pulmonary effort is normal. No respiratory distress.     Breath sounds: Normal breath sounds.  Abdominal:     General: Bowel sounds are normal.     Palpations: Abdomen is soft. There is no hepatomegaly, splenomegaly or mass.     Tenderness: There is no abdominal tenderness. There is no guarding or rebound.     Hernia: No hernia is present.  Skin:    General: Skin is warm and dry.     Coloration: Skin is not jaundiced.     Findings: No rash.  Neurological:     General: No focal deficit present.     Mental Status: She is alert and oriented to person, place, and time.   Psychiatric:        Attention and Perception: Attention normal.  Mood and Affect: Mood normal.        Speech: Speech normal.        Behavior: Behavior normal.        Thought Content: Thought content normal.        Cognition and Memory: Cognition and memory normal.      Assessment:  Very pleasant 64 year old female who presents on referral from primary care.  She has longstanding history of multiple chronic illnesses as per above.  She does have GERD which has not been doing well since her switch from [place] to Protonix.  She had to switch due to insurance not covering [place].  At this point I will try her on a different PPI such as Nexium to see if it is covered and works any better.  Given her significant upper GI symptoms this is a possible source of bleeding.  She does note that she has had sticky, black stools pretty consistently.  Denies any hematochezia.  Colonoscopy and EGD completed in 2017, as outlined above.  Anemia labs with low iron saturation.  No ferritin provided but she states this was just drawn in the past couple weeks at her rheumatologist and we will call to request the labs.  Overall noted iron deficiency anemia in the setting of dark stools (not on iron or Pepto-Bismol) and chronic, worsening GERD symptoms.  We will further evaluate with a limited endoscopy.  We can consider colonoscopy and/or Givens capsule depending on findings on EGD.   Plan: 1. Request ferritin labs from [facility] 2. Request patient complete iFOBT on dark stools 3. Plan for upper endoscopy to further evaluate 4. Notify us of any worsening symptoms 5. Scanned labs that she brought to [place] today that were drawn last week  Proceed with EGD with Dr. Abbey Chatters in near future: the risks, benefits, and alternatives have been discussed with the patient in detail. The patient states understanding and desires to proceed.  ASA III (DM, OSA)  Thank you for allowing [place] to  participate in the care of [place], DNP, AGNP-C Adult & Gerontological Nurse Practitioner [facility]   04/10/2020 8:55 AM   Disclaimer: This note was dictated with voice recognition software. Similar sounding words can inadvertently be transcribed and may not be corrected upon review.

## 2020-05-14 ENCOUNTER — Other Ambulatory Visit (HOSPITAL_COMMUNITY)
Admission: RE | Admit: 2020-05-14 | Discharge: 2020-05-14 | Disposition: A | Discharge status: Home / Self Care | Payer: Medicare PPO | Source: Ambulatory Visit | Attending: Internal Medicine | Admitting: Internal Medicine

## 2020-05-14 ENCOUNTER — Other Ambulatory Visit: Payer: Self-pay

## 2020-05-14 ENCOUNTER — Encounter (HOSPITAL_COMMUNITY)
Admission: RE | Admit: 2020-05-14 | Discharge: 2020-05-14 | Disposition: A | Discharge status: Home / Self Care | Payer: Medicare PPO | Source: Ambulatory Visit | Attending: Internal Medicine | Admitting: Internal Medicine

## 2020-05-14 DIAGNOSIS — Z20822 Contact with and (suspected) exposure to covid-19: Secondary | ICD-10-CM | POA: Diagnosis not present

## 2020-05-14 DIAGNOSIS — Z01812 Encounter for preprocedural laboratory examination: Secondary | ICD-10-CM | POA: Diagnosis present

## 2020-05-14 NOTE — Patient Instructions (Signed)
37    Your procedure is scheduled on: 05/15/2020                           [place]  Report to [facility] at     1:00  PM.  Call this number if you have problems the morning of surgery: [phone]   Remember:   Follow instructions on letter from office regarding when to stop eating and drinking        No Smoking the day of procedure      Take these medicines the morning of surgery with A SIP OF WATER: Protonix and lyrica   Do not wear jewelry, make-up or nail polish.  Do not wear lotions, powders, or perfumes. You may wear deodorant.                Do not bring valuables to the hospital.  Contacts, dentures or bridgework may not be worn into surgery.  Leave suitcase in the car. After surgery it may be brought to your room.  For patients admitted to the hospital, checkout time is 11:00 AM the day of discharge.   Patients discharged the day of surgery will not be allowed to drive home. Upper Endoscopy, Adult Upper endoscopy is a procedure to look inside the upper GI (gastrointestinal) tract. The upper GI tract is made up of:  The part of the body that moves food from your mouth to your stomach (esophagus).  The stomach.  The first part of your small intestine (duodenum). This procedure is also called esophagogastroduodenoscopy (EGD) or gastroscopy. In this procedure, your health care provider passes a thin, flexible tube (endoscope) through your mouth and down your esophagus into your stomach. A small camera is attached to the end of the tube. Images from the camera appear on a monitor in the exam room. During this procedure, your health care provider may also remove a small piece of tissue to be sent to a lab and examined under a microscope (biopsy). Your health care provider may do an upper endoscopy to diagnose cancers of the upper GI tract. You may also have this procedure to find the cause of other conditions, such as:  Stomach pain.  Heartburn.  Pain or problems when  swallowing.  Nausea and vomiting.  Stomach bleeding.  Stomach ulcers. Tell a health care provider about:  Any allergies you have.  All medicines you are taking, including vitamins, herbs, eye drops, creams, and over-the-counter medicines.  Any problems you or family members have had with anesthetic medicines.  Any blood disorders you have.  Any surgeries you have had.  Any medical conditions you have.  Whether you are pregnant or may be pregnant. What are the risks? Generally, this is a safe procedure. However, problems may occur, including:  Infection.  Bleeding.  Allergic reactions to medicines.  A tear or hole (perforation) in the esophagus, stomach, or duodenum. What happens before the procedure? Staying hydrated Follow instructions from your health care provider about hydration, which may include:  Up to 2 hours before the procedure - you may continue to drink clear liquids, such as water, clear fruit juice, black coffee, and plain tea.  Eating and drinking restrictions Follow instructions from your health care provider about eating and drinking, which may include:  8 hours before the procedure - stop eating heavy meals or foods, such as meat, fried foods, or fatty foods.  6 hours before the procedure - stop eating  light meals or foods, such as toast or cereal.  6 hours before the procedure - stop drinking milk or drinks that contain milk.  2 hours before the procedure - stop drinking clear liquids. Medicines Ask your health care provider about:  Changing or stopping your regular medicines. This is especially important if you are taking diabetes medicines or blood thinners.  Taking medicines such as aspirin and ibuprofen. These medicines can thin your blood. Do not take these medicines unless your health care provider tells you to take them.  Taking over-the-counter medicines, vitamins, herbs, and supplements. General instructions  Plan to have someone  take you home from the hospital or clinic.  If you will be going home right after the procedure, plan to have someone with you for 24 hours.  Ask your health care provider what steps will be taken to help prevent infection. What happens during the procedure?  1. An IV will be inserted into one of your veins. 2. You may be given one or more of the following: ? A medicine to help you relax (sedative). ? A medicine to numb the throat (local anesthetic). 3. You will lie on your left side on an exam table. 4. Your health care provider will pass the endoscope through your mouth and down your esophagus. 5. Your health care provider will use the scope to check the inside of your esophagus, stomach, and duodenum. Biopsies may be taken. 6. The endoscope will be removed. The procedure may vary among health care providers and hospitals. What happens after the procedure?  Your blood pressure, heart rate, breathing rate, and blood oxygen level will be monitored until you leave the hospital or clinic.  Do not drive for 24 hours if you were given a sedative during your procedure.  When your throat is no longer numb, you may be given some fluids to drink.  It is up to you to get the results of your procedure. Ask your health care provider, or the department that is doing the procedure, when your results will be ready. Summary  Upper endoscopy is a procedure to look inside the upper GI tract.  During the procedure, an IV will be inserted into one of your veins. You may be given a medicine to help you relax.  A medicine will be used to numb your throat.  The endoscope will be passed through your mouth and down your esophagus. This information is not intended to replace advice given to you by your health care provider. Make sure you discuss any questions you have with your health care provider. Document Revised: 01/27/2018 Document Reviewed: 01/04/2018 Elsevier Patient Education  [place] After  Please read the instructions outlined below and  refer to this sheet in the next few weeks. These discharge instructions provide you with general information on caring for yourself after you leave the hospital. Your doctor may also give you specific instructions. While your treatment has been planned according to the most current medical practices available, unavoidable complications occasionally occur. If you have any problems or questions after discharge, please call your doctor. HOME CARE INSTRUCTIONS Activity  You may resume your regular activity but move at a slower pace for the next 24 hours.   Take frequent rest periods for the next 24 hours.   Walking will help expel (get rid of) the air and reduce the bloated feeling in your abdomen.   No driving for 24 hours (because of the anesthesia (medicine) used during the test).   You may shower.   Do not sign any important legal documents or operate any machinery for 24 hours (because of the anesthesia used during the test).  Nutrition  Drink plenty of fluids.   You may resume your normal diet.   Begin with a light meal and progress to your normal diet.   Avoid alcoholic beverages for 24 hours or as instructed by your caregiver.  Medications You may resume your normal medications unless your caregiver tells you otherwise. What you can expect today  You may experience abdominal discomfort such as a feeling of fullness or "gas" pains.   You may experience a sore throat for 2 to 3 days. This is normal. Gargling with salt water may help this.  Follow-up Your doctor will discuss the results of your test with you. SEEK IMMEDIATE MEDICAL CARE IF:  You have excessive nausea (feeling sick to your stomach) and/or vomiting.   You have severe abdominal pain and distention  (swelling).   You have trouble swallowing.   You have a temperature over 100 F (37.8 C).   You have rectal bleeding or vomiting of blood.  Document Released: 03/18/2004 Document Revised: 07/24/2011 Document Reviewed: 09/29/2007

## 2020-05-15 ENCOUNTER — Encounter (HOSPITAL_COMMUNITY)
Admission: RE | Disposition: A | Discharge status: Home / Self Care | Payer: Self-pay | Source: Home / Self Care | Attending: Internal Medicine

## 2020-05-15 ENCOUNTER — Ambulatory Visit (HOSPITAL_COMMUNITY): Payer: Medicare PPO | Admitting: Anesthesiology

## 2020-05-15 ENCOUNTER — Ambulatory Visit (HOSPITAL_COMMUNITY)
Admission: RE | Admit: 2020-05-15 | Discharge: 2020-05-15 | Disposition: A | Discharge status: Home / Self Care | Payer: Medicare PPO | Attending: Internal Medicine | Admitting: Internal Medicine

## 2020-05-15 DIAGNOSIS — M3500 Sicca syndrome, unspecified: Secondary | ICD-10-CM | POA: Diagnosis not present

## 2020-05-15 DIAGNOSIS — D509 Iron deficiency anemia, unspecified: Secondary | ICD-10-CM | POA: Diagnosis present

## 2020-05-15 DIAGNOSIS — I10 Essential (primary) hypertension: Secondary | ICD-10-CM | POA: Diagnosis not present

## 2020-05-15 DIAGNOSIS — F419 Anxiety disorder, unspecified: Secondary | ICD-10-CM | POA: Insufficient documentation

## 2020-05-15 DIAGNOSIS — D649 Anemia, unspecified: Secondary | ICD-10-CM | POA: Insufficient documentation

## 2020-05-15 DIAGNOSIS — Z853 Personal history of malignant neoplasm of breast: Secondary | ICD-10-CM | POA: Diagnosis not present

## 2020-05-15 DIAGNOSIS — G4730 Sleep apnea, unspecified: Secondary | ICD-10-CM | POA: Insufficient documentation

## 2020-05-15 DIAGNOSIS — Z79899 Other long term (current) drug therapy: Secondary | ICD-10-CM | POA: Diagnosis not present

## 2020-05-15 DIAGNOSIS — F329 Major depressive disorder, single episode, unspecified: Secondary | ICD-10-CM | POA: Insufficient documentation

## 2020-05-15 DIAGNOSIS — K317 Polyp of stomach and duodenum: Secondary | ICD-10-CM | POA: Insufficient documentation

## 2020-05-15 DIAGNOSIS — K219 Gastro-esophageal reflux disease without esophagitis: Secondary | ICD-10-CM | POA: Diagnosis not present

## 2020-05-15 DIAGNOSIS — K2970 Gastritis, unspecified, without bleeding: Secondary | ICD-10-CM | POA: Diagnosis not present

## 2020-05-15 DIAGNOSIS — K921 Melena: Secondary | ICD-10-CM | POA: Diagnosis not present

## 2020-05-15 DIAGNOSIS — N3281 Overactive bladder: Secondary | ICD-10-CM | POA: Insufficient documentation

## 2020-05-15 DIAGNOSIS — J45909 Unspecified asthma, uncomplicated: Secondary | ICD-10-CM | POA: Insufficient documentation

## 2020-05-15 DIAGNOSIS — E119 Type 2 diabetes mellitus without complications: Secondary | ICD-10-CM | POA: Diagnosis not present

## 2020-05-15 DIAGNOSIS — Z794 Long term (current) use of insulin: Secondary | ICD-10-CM | POA: Diagnosis not present

## 2020-05-15 HISTORY — PX: BIOPSY: SHX5522

## 2020-05-15 HISTORY — PX: ESOPHAGOGASTRODUODENOSCOPY (EGD) WITH PROPOFOL: SHX5813

## 2020-05-15 HISTORY — PX: POLYPECTOMY: SHX5525

## 2020-05-15 LAB — GLUCOSE, CAPILLARY
Glucose-Capillary: 122 mg/dL — ABNORMAL HIGH (ref 70–99)
Glucose-Capillary: 88 mg/dL (ref 70–99)

## 2020-05-15 LAB — SARS CORONAVIRUS 2 (TAT 6-24 HRS): SARS Coronavirus 2: NEGATIVE

## 2020-05-15 SURGERY — ESOPHAGOGASTRODUODENOSCOPY (EGD) WITH PROPOFOL
Anesthesia: Monitor Anesthesia Care

## 2020-05-15 MED ORDER — GLYCOPYRROLATE 0.2 MG/ML IJ SOLN
0.2000 mg | Freq: Once | INTRAMUSCULAR | Status: AC
  Administered 2020-05-15: 0.2 mg via INTRAVENOUS

## 2020-05-15 MED ORDER — LACTATED RINGERS IV SOLN: INTRAVENOUS | Status: DC | PRN

## 2020-05-15 MED ORDER — LIDOCAINE VISCOUS HCL 2 % MT SOLN
OROMUCOSAL | Status: AC
  Filled 2020-05-15: qty 15

## 2020-05-15 MED ORDER — PROPOFOL 10 MG/ML IV BOLUS
INTRAVENOUS | Status: DC | PRN
  Administered 2020-05-15: 100 mg via INTRAVENOUS
  Administered 2020-05-15: 50 mg via INTRAVENOUS

## 2020-05-15 MED ORDER — PROPOFOL 10 MG/ML IV BOLUS
INTRAVENOUS | Status: AC
  Filled 2020-05-15: qty 40

## 2020-05-15 MED ORDER — LIDOCAINE HCL (CARDIAC) PF 50 MG/5ML IV SOSY
PREFILLED_SYRINGE | INTRAVENOUS | Status: DC | PRN
  Administered 2020-05-15: 20 mg via INTRAVENOUS
  Administered 2020-05-15 (×2): 50 mg via INTRAVENOUS
  Administered 2020-05-15: 100 mg via INTRAVENOUS

## 2020-05-15 MED ORDER — GLYCOPYRROLATE 0.2 MG/ML IJ SOLN
INTRAMUSCULAR | Status: AC
  Filled 2020-05-15: qty 1

## 2020-05-15 MED ORDER — LACTATED RINGERS IV SOLN: Freq: Once | INTRAVENOUS | Status: AC

## 2020-05-15 MED ORDER — LIDOCAINE VISCOUS HCL 2 % MT SOLN
15.0000 mL | Freq: Once | OROMUCOSAL | Status: AC
  Administered 2020-05-15: 15 mL via OROMUCOSAL

## 2020-05-15 NOTE — Anesthesia Preprocedure Evaluation (Addendum)
Anesthesia Evaluation  Patient identified by MRN, date of birth, ID band Patient awake    Reviewed: Allergy & Precautions, NPO status , Patient's Chart, lab work & pertinent test results  History of Anesthesia Complications (+) PONV and history of anesthetic complications  Airway Mallampati: II  TM Distance: >3 FB Neck ROM: Full    Dental  (+) Dental Advisory Given, Teeth Intact   Pulmonary asthma , sleep apnea and Continuous Positive Airway Pressure Ventilation ,    Pulmonary exam normal breath sounds clear to auscultation       Cardiovascular METS (severe back pain and sciatica): hypertension, Pt. on medications Normal cardiovascular exam Rhythm:Regular Rate:Normal     Neuro/Psych  Headaches, PSYCHIATRIC DISORDERS Anxiety Depression    GI/Hepatic GERD  Medicated,  Endo/Other  diabetes, Well Controlled, Type 2  Renal/GU      Musculoskeletal  (+) Arthritis ,   Abdominal   Peds  Hematology  (+) anemia ,   Anesthesia Other Findings Sjogren's disease ([place]) Right breast cancer  Reproductive/Obstetrics                           Anesthesia Physical Anesthesia Plan  ASA: III  Anesthesia Plan:    Post-op Pain Management:    Induction: Intravenous  PONV Risk Score and Plan: TIVA  Airway Management Planned: Nasal Cannula and Natural Airway  Additional Equipment:   Intra-op Plan:   Post-operative Plan:   Informed Consent: I have reviewed the patients History and Physical, chart, labs and discussed the procedure including the risks, benefits and alternatives for the proposed anesthesia with the patient or authorized representative who has indicated his/her understanding and acceptance.     Dental advisory given  Plan Discussed with: CRNA and Surgeon  Anesthesia Plan Comments:        Anesthesia Quick Evaluation

## 2020-05-15 NOTE — H&P (Signed)
Primary Care Physician:  Wendi Maya, NP Primary Gastroenterologist:  Dr. Abbey Chatters  Pre-Procedure History & Physical: HPI:  Heidi [name] is a 64 y.o. female is here for an EGD for melena and GERD  Past Medical History:  Diagnosis Date   Anemia    takes iron supplement   Anxiety    Arthritis    Asthma    prn neb.   Cancer [facility])    right breast cancer   Depression    Diabetes mellitus    IDDM   Environmental allergies    GERD (gastroesophageal reflux disease)    Headache(784.0)    migraines   History of MRSA infection 08/2012   right breast   Hypertension    under control with med., has been on med. x 10-15 yr.   Overactive bladder    PONV (postoperative nausea and vomiting)    used scop patch 7/14-please use again per pt request   Sjogren's disease ([place])    Sleep apnea    uses CPAP nightly    Past Surgical History:  Procedure Laterality Date   ANKLE ARTHROSCOPY Left    BREAST RECONSTRUCTION  05/24/2012   Procedure: BREAST RECONSTRUCTION;  Surgeon: Cristine Polio, MD;  Location: [place];  Service: Plastics;  Laterality: Right;  NO FLEX HD   BREAST RECONSTRUCTION Right 01/17/2013   Procedure: RIGHT BREAST RECONSTRUCTION WITH PLACEMENT OF TISSUE EXPANDER ;  Surgeon: Cristine Polio, MD;  Location: [place];  Service: Plastics;  Laterality: Right;   BREAST REDUCTION SURGERY Left 02/28/2013   Procedure: LEFT BREAST REDUCTION  (BREAST);  Surgeon: Cristine Polio, MD;  Location: [place];  Service: Plastics;  Laterality: Left;   CESAREAN SECTION     MASTECTOMY Right 03/2012   REMOVAL OF TISSUE EXPANDER AND PLACEMENT OF IMPLANT Right 05/10/2013   Procedure: RIGHT REMOVAL OF TISSUE EXPANDER ;  Surgeon: Cristine Polio, MD;  Location: [place];  Service: Plastics;  Laterality: Right;   SHOULDER ARTHROSCOPY Right    TISSUE EXPANDER PLACEMENT  05/24/2012   Procedure: TISSUE  EXPANDER;  Surgeon: Cristine Polio, MD;  Location: [place];  Service: Plastics;  Laterality: Right;    Prior to Admission medications   Medication Sig Start Date End Date Taking? Authorizing Provider  benazepril-hydrochlorthiazide (LOTENSIN HCT) 20-12.5 MG per tablet Take 1 tablet by mouth daily.   Yes [provider]  buPROPion (ZYBAN) 150 MG 12 hr tablet Take 150 mg by mouth 2 (two) times daily.    Yes [provider]  clonazePAM (KLONOPIN) 1 MG tablet Take 0.5 mg by mouth at bedtime as needed for anxiety.    Yes [provider]  colchicine 0.6 MG tablet Take 0.6 mg by mouth daily.   Yes [provider]  DULoxetine (CYMBALTA) 60 MG capsule Take 120 mg by mouth at bedtime.    Yes [provider]  hydroxychloroquine (PLAQUENIL) 200 MG tablet Take 400 mg by mouth daily. Takes 2 tablets once daily.   Yes [provider]  LORazepam (ATIVAN) 2 MG tablet Take 2 mg by mouth at bedtime as needed for anxiety.   Yes [provider]  metFORMIN (GLUCOPHAGE) 500 MG tablet Take 1,000 mg by mouth 2 (two) times daily.    Yes [provider]  methadone (DOLOPHINE) 5 MG tablet Take 5 mg by mouth at bedtime as needed for severe pain.    Yes [provider]  modafinil (PROVIGIL) 200 MG tablet  Take 200 mg by mouth daily.   Yes [provider]  oxybutynin (DITROPAN-XL) 10 MG 24 hr tablet Take 5 mg by mouth 2 (two) times daily.    Yes [provider]  pantoprazole (PROTONIX) 40 MG tablet Take 40 mg by mouth daily.   Yes [provider]  pregabalin (LYRICA) 50 MG capsule Take 50 mg by mouth in the morning and at bedtime.   Yes [provider]  PRESCRIPTION MEDICATION Use as directed in the mouth or throat daily. vraylar   Yes [provider]    Allergies as of 04/10/2020 - Review Complete 04/10/2020  Allergen Reaction Noted   Soap Other (See Comments) 02/22/2013   Latex  Itching 02/22/2013   Sulfa antibiotics Rash 05/20/2012    Family History  Problem Relation Age of Onset   Colon cancer Paternal Grandmother    Colon cancer Paternal Grandfather     Social History   Socioeconomic History   Marital status: Married    Spouse name: Not on file   Number of children: Not on file   Years of education: Not on file   Highest education level: Not on file  Occupational History   Not on file  Tobacco Use   Smoking status: Never Smoker   Smokeless tobacco: Never Used  Substance and Sexual Activity   Alcohol use: No   Drug use: No   Sexual activity: Not Currently  Other Topics Concern   Not on file  Social History Narrative   Not on file   Social Determinants of Health   Financial Resource Strain:    Difficulty of Paying Living Expenses: Not on file  Food Insecurity:    Worried About Running Out of Food in the Last Year: Not on file   [employer] of Food in the Last Year: Not on file  Transportation Needs:    Lack of Transportation (Medical): Not on file   Lack of Transportation (Non-Medical): Not on file  Physical Activity:    Days of Exercise per Week: Not on file   Minutes of Exercise per Session: Not on file  Stress:    Feeling of Stress : Not on file  Social Connections:    Frequency of Communication with Friends and Family: Not on file   Frequency of Social Gatherings with Friends and Family: Not on file   Attends Religious Services: Not on file   Active Member of Clubs or Organizations: Not on file   Attends Archivist Meetings: Not on file   Marital Status: Not on file  Intimate Partner Violence:    Fear of Current or Ex-Partner: Not on file   Emotionally Abused: Not on file   Physically Abused: Not on file   Sexually Abused: Not on file    Review of Systems: See HPI, otherwise negative ROS  Impression/Plan: Heidi [name] is here for an EGD for melena and GERD Risks, benefits,  limitations, imponderables and alternatives regarding colonoscopy have been reviewed with the patient. Questions have been answered. All parties agreeable.

## 2020-05-15 NOTE — Transfer of Care (Signed)
Immediate Anesthesia Transfer of Care Note  Patient: Heidi [name]  Procedure(s) Performed: ESOPHAGOGASTRODUODENOSCOPY (EGD) WITH PROPOFOL (N/A ) BIOPSY POLYPECTOMY  Patient Location: PACU  Anesthesia Type:General  Level of Consciousness: awake, alert , oriented and patient cooperative  Airway & Oxygen Therapy: Patient Spontanous Breathing and Patient connected to nasal cannula oxygen  Post-op Assessment: Report given to RN, Post -op Vital signs reviewed and stable and Patient moving all extremities  Post vital signs: Reviewed and stable  Last Vitals:  Vitals Value Taken Time  BP 92/75 05/15/20 1405  Temp    Pulse 80 05/15/20 1406  Resp 22 05/15/20 1406  SpO2 92 % 05/15/20 1406  Vitals shown include unvalidated device data.  Last Pain:  Vitals:   05/15/20 1358  PainSc: 0-No pain         Complications: No complications documented.

## 2020-05-15 NOTE — Discharge Instructions (Addendum)
EGD Discharge instructions Please read the instructions outlined below and refer to this sheet in the next few weeks. These discharge instructions provide you with general information on caring for yourself after you leave the hospital. Your doctor may also give you specific instructions. While your treatment has been planned according to the most current medical practices available, unavoidable complications occasionally occur. If you have any problems or questions after discharge, please call your doctor. ACTIVITY  You may resume your regular activity but move at a slower pace for the next 24 hours.   Take frequent rest periods for the next 24 hours.   Walking will help expel (get rid of) the air and reduce the bloated feeling in your abdomen.   No driving for 24 hours (because of the anesthesia (medicine) used during the test).   You may shower.   Do not sign any important legal documents or operate any machinery for 24 hours (because of the anesthesia used during the test).  NUTRITION  Drink plenty of fluids.   You may resume your normal diet.   Begin with a light meal and progress to your normal diet.   Avoid alcoholic beverages for 24 hours or as instructed by your caregiver.  MEDICATIONS  You may resume your normal medications unless your caregiver tells you otherwise.  WHAT YOU CAN EXPECT TODAY  You may experience abdominal discomfort such as a feeling of fullness or "gas" pains.  FOLLOW-UP  Your doctor will discuss the results of your test with you.  SEEK IMMEDIATE MEDICAL ATTENTION IF ANY OF THE FOLLOWING OCCUR:  Excessive nausea (feeling sick to your stomach) and/or vomiting.   Severe abdominal pain and distention (swelling).   Trouble swallowing.   Temperature over 101 F (37.8 C).   Rectal bleeding or vomiting of blood.   Your EGD showed a mild amount of inflammation in your stomach.  I biopsied this to rule out infection with H. pylori.  Await pathology  results, my office will contact you.  You also have a polyp in your stomach as well that I biopsied.  Possible this will need to be removed in the future.  Will await pathology results and determine decision at that time.  Follow-up with GI as previously scheduled or in 6 to 8 weeks.  I hope you have a great rest of your week!  Elon Alas. Abbey Chatters, D.O. Gastroenterology and Hepatology [facility]

## 2020-05-15 NOTE — Op Note (Signed)
[facility] Patient Name: Heidi [name] Procedure Date: 05/15/2020 1:38 PM MRN: [id number] Date of Birth: Oct 19, 1955 Attending MD: Elon Alas. Abbey Chatters DO CSN: [id number] Age: 64 Admit Type: Outpatient Procedure:                Upper GI endoscopy Indications:              Iron deficiency anemia, Melena Providers:                Elon Alas. Wilfredo Canterbury, DO, [place] B. Sharon Seller, RN, Wynonia Musty Tech, Technician Referring MD:              Medicines:                See the Anesthesia note for documentation of the                            administered medications Complications:            No immediate complications. Estimated Blood Loss:     Estimated blood loss was minimal. Procedure:                Pre-Anesthesia Assessment:                           - The anesthesia plan was to use monitored                            anesthesia care (MAC).                           After obtaining informed consent, the endoscope was                            passed under direct vision. Throughout the                            procedure, the patient's blood pressure, pulse, and                            oxygen saturations were monitored continuously. The                            GIF-H190 ([id number]) scope was introduced through the                            mouth, and advanced to the second part of duodenum.                            The upper GI endoscopy was accomplished without                            difficulty. The patient tolerated the procedure                            well.  Findings:      There is no endoscopic evidence of esophagitis, inflammation,       ulcerations or varices in the entire esophagus.      Diffuse mild inflammation characterized by erythema was found in the       entire examined stomach. Biopsies were taken with a cold forceps for       Helicobacter pylori testing.      One 12 mm semi-sessile polyp with no bleeding and no stigmata of  recent       bleeding was found in the gastric antrum. Biopsies were taken with a       cold forceps for histology.      The duodenal bulb, first portion of the duodenum and second portion of       the duodenum were normal. Impression:               - Gastritis. Biopsied.                           - One gastric polyp. Biopsied.                           - Normal duodenal bulb, first portion of the                            duodenum and second portion of the duodenum. Moderate Sedation:      Per Anesthesia Care Recommendation:           - Patient has a contact number available for                            emergencies. The signs and symptoms of potential                            delayed complications were discussed with the                            patient. Return to normal activities tomorrow.                            Written discharge instructions were provided to the                            patient.                           - Resume previous diet.                           - Continue present medications.                           - Await pathology results.                           - Return to GI clinic as previously scheduled.                           -  Consider small bowel capsule endoscopy for                            completeness. Procedure Code(s):        --- Professional ---                           [id number], Esophagogastroduodenoscopy, flexible,                            transoral; with biopsy, single or multiple Diagnosis Code(s):        --- Professional ---                           K29.70, Gastritis, unspecified, without bleeding                           K31.7, Polyp of stomach and duodenum                           D50.9, Iron deficiency anemia, unspecified                           K92.1, Melena (includes Hematochezia) CPT copyright 2019 American Medical Association. All rights reserved. The codes documented in this report are preliminary and upon coder  review may  be revised to meet current compliance requirements. Elon Alas. Abbey Chatters, DO [place] Abbey Chatters, DO 05/15/2020 2:06:58 PM This report has been signed electronically. Number of Addenda: 0

## 2020-05-15 NOTE — Anesthesia Postprocedure Evaluation (Signed)
Anesthesia Post Note  Patient: Heidi [name]  Procedure(s) Performed: ESOPHAGOGASTRODUODENOSCOPY (EGD) WITH PROPOFOL (N/A ) BIOPSY POLYPECTOMY  Patient location during evaluation: PACU Anesthesia Type: General Level of consciousness: awake, awake and alert and oriented Pain management: pain level controlled Vital Signs Assessment: post-procedure vital signs reviewed and stable Respiratory status: spontaneous breathing, respiratory function stable, nonlabored ventilation and patient connected to nasal cannula oxygen Cardiovascular status: blood pressure returned to baseline and stable Postop Assessment: no headache and no backache Anesthetic complications: no   No complications documented.   Last Vitals:  Vitals:   05/15/20 1330 05/15/20 1345  BP: (!) 144/66   Pulse: 77 79  Resp: (!) 26 15  Temp:  37.2 C  SpO2: 98% 99%    Last Pain:  Vitals:   05/15/20 1358  PainSc: 0-No pain                 Tacy Learn

## 2020-05-17 ENCOUNTER — Other Ambulatory Visit: Payer: Self-pay

## 2020-05-17 LAB — SURGICAL PATHOLOGY

## 2020-05-21 ENCOUNTER — Encounter (HOSPITAL_COMMUNITY): Payer: Self-pay | Admitting: Internal Medicine

## 2020-06-01 ENCOUNTER — Telehealth: Payer: Self-pay | Admitting: Internal Medicine

## 2020-06-01 NOTE — Telephone Encounter (Signed)
PATIENT CALLED REQUESTING RESULTS FORM HER BIOPSY THAT WAS DONE DURING HER PROCEDURE, PLEASE CALL

## 2020-06-01 NOTE — Telephone Encounter (Signed)
Called pt and made her aware that once Dr. Abbey Chatters reviews results and makes his recommendations, that I will contact her.    Dr. Abbey Chatters:  Pt wanted to inform us that Protonix is not working for her.  She is requesting to get back on Dexilant.

## 2020-06-02 NOTE — Telephone Encounter (Signed)
Please call let patient know that her biopsies were unremarkable, the polyp I biopsied was hyperplastic.  She has inflammation in her stomach, negative for infection with H. pylori.  Okay to change her to Dexilant 60 mg daily.  Please send in this medication.  Please do apologize for the delay on her results.  Thank you

## 2020-06-04 ENCOUNTER — Other Ambulatory Visit: Payer: Self-pay | Admitting: *Deleted

## 2020-06-04 NOTE — Telephone Encounter (Signed)
Called pt and informed her that her biopsies were unremarkable, the polyp biopsied was hyperplastic.  She was informed that she has inflammation in her stomach, negative for infection with H. pylori.  Pt informed that Dr. Abbey Chatters says it is okay to change her to Dexilant 60 mg daily.  Informed her that we will send in this medication.  Apologized to pt for the delay on her results.  Pt said that she was very appreciative of the results and for everything Dr. Abbey Chatters did for her.

## 2020-06-04 NOTE — Telephone Encounter (Signed)
Pt called back and wanted me to ask Dr. Abbey Chatters if he thought that we need to pursue a colonoscopy to see where her blood loss is coming from.  Pt would also like to know if you want her to take Dexilant twice daily like she did in the past.  Also, how many would you like me to dispense and how many refills.

## 2020-06-12 ENCOUNTER — Ambulatory Visit: Payer: Medicare PPO | Admitting: Nurse Practitioner

## 2020-06-19 ENCOUNTER — Other Ambulatory Visit: Payer: Self-pay | Admitting: *Deleted

## 2020-06-19 MED ORDER — DEXLANSOPRAZOLE 60 MG PO CPDR: 60.0000 mg | DELAYED_RELEASE_CAPSULE | Freq: Every day | ORAL | 5 refills | Status: DC

## 2020-06-19 NOTE — Telephone Encounter (Signed)
Spoke to pt and she was made aware to take Dexilant 60 mg daily.  She was made aware that we will be back in touch with her to get her colonoscopy scheduled.  Pt voiced understanding.

## 2020-06-19 NOTE — Telephone Encounter (Signed)
Please send in Dexilant 60 mg daily with 5 refills.  As far as colonoscopy, I think it is reasonable to pursue this given her iron deficiency anemia if patient is amenable.  Can we set her up for this.  Thank you

## 2020-06-21 ENCOUNTER — Telehealth: Payer: Self-pay

## 2020-06-21 DIAGNOSIS — K219 Gastro-esophageal reflux disease without esophagitis: Secondary | ICD-10-CM

## 2020-06-21 DIAGNOSIS — K2270 Barrett's esophagus without dysplasia: Secondary | ICD-10-CM

## 2020-06-21 NOTE — Telephone Encounter (Signed)
Patient called back. She has been scheduled for 12/28 at 8:30am. Aware will mail prep instructions with pre-op/covid test appt once scheduled. Confirmed mailing address  PA approved via [employer] for TCS. Auth# [id number] dates 08/14/2020-09/13/2020

## 2020-06-21 NOTE — Telephone Encounter (Signed)
Received fax from [facility] is $100 with pt's insurance. Request to change to something more affordable.

## 2020-06-21 NOTE — Telephone Encounter (Signed)
LMOVM for pt to call back to schedule procedure

## 2020-06-21 NOTE — Telephone Encounter (Signed)
Spoke with Dr.Carver, pt does not need another ov. He said it was ok to just schedule tcs. Looks like she was an ASA 3 or 4 because she was scheduled on a Tuesday last time.   Please schedule tcs

## 2020-06-25 ENCOUNTER — Encounter: Payer: Self-pay | Admitting: *Deleted

## 2020-06-27 MED ORDER — OMEPRAZOLE 40 MG PO CPDR: 40.0000 mg | DELAYED_RELEASE_CAPSULE | Freq: Two times a day (BID) | ORAL | 3 refills | Status: DC

## 2020-06-27 NOTE — Telephone Encounter (Signed)
Routine to Walden Field, NP who last saw patient in the office for further recommendations.

## 2020-06-27 NOTE — Telephone Encounter (Signed)
Previously tried and failed protonix. I've sent in Omeprazole 40 mg bid (for 24 hour coverage).  Call in 3-4 weeks if no improvement.  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  Call if any questions or concerns.

## 2020-06-28 ENCOUNTER — Telehealth: Payer: Self-pay

## 2020-06-28 NOTE — Telephone Encounter (Signed)
Called and informed pt.  

## 2020-06-28 NOTE — Telephone Encounter (Signed)
Received a paper requesting from [facility] stating that pts medication Dexilant 60 mg is $100.00. lmom, waiting on a return call to see if pt has picked up the medication and if she wants to keep Dexilant 60 mg. If not, will discuss changing medication with provider.

## 2020-06-29 NOTE — Telephone Encounter (Signed)
Called pt and she said that she could not afford the RX for Dexilant.  She said that [facility] gave her Omeprazole and she has been taking that.

## 2020-07-02 NOTE — Telephone Encounter (Signed)
Noted. Routing to Dr. Abbey Chatters as an Heidi [name], Dexilant was too expensive for pt. Pt is taking Omeprazole in the place of Dexilant.

## 2020-07-02 NOTE — Telephone Encounter (Signed)
Noted thanks °

## 2020-08-08 NOTE — Patient Instructions (Addendum)
Your procedure is scheduled on: 08/14/2020  Report to [place] Entrance at   7:00  AM.  Call this number if you have problems the morning of surgery: [phone]   Remember:              Follow Directions on the letter you received from Your Physician's office regarding the Bowel Prep              No Smoking the day of Procedure :   Take these medicines the morning of surgery with A SIP OF WATER:Klonopin, Cymbalta, omeprazole, inderal and allopurinol               No diabetic medication morning of procedure   Do not wear jewelry, make-up or nail polish.    Do not bring valuables to the hospital.  Contacts, dentures or bridgework may not be worn into surgery.  .   Patients discharged the day of surgery will not be allowed to drive home.     Colonoscopy, Adult, Care After This sheet gives you information about how to care for yourself after your procedure. Your health care provider may also give you more specific instructions. If you have problems or questions, contact your health care provider. What can I expect after the procedure? After the procedure, it is common to have:  A small amount of blood in your stool for 24 hours after the procedure.  Some gas.  Mild abdominal cramping or bloating.  Follow these instructions at home: General instructions   For the first 24 hours after the procedure: ? Do not drive or use machinery. ? Do not sign important documents. ? Do not drink alcohol. ? Do your regular daily activities at a slower pace than normal. ? Eat soft, easy-to-digest foods. ? Rest often.  Take over-the-counter or prescription medicines only as told by your health care provider.  It is up to you to get the results of your procedure. Ask your health care provider, or the department performing the procedure, when your results will be ready. Relieving cramping and bloating  Try walking around when you have cramps or feel bloated.  Apply heat to your  abdomen as told by your health care provider. Use a heat source that your health care provider recommends, such as a moist heat pack or a heating pad. ? Place a towel between your skin and the heat source. ? Leave the heat on for 20-30 minutes. ? Remove the heat if your skin turns bright red. This is especially important if you are unable to feel pain, heat, or cold. You may have a greater risk of getting burned. Eating and drinking  Drink enough fluid to keep your urine clear or pale yellow.  Resume your normal diet as instructed by your health care provider. Avoid heavy or fried foods that are hard to digest.  Avoid drinking alcohol for as long as instructed by your health care provider. Contact a health care provider if:  You have blood in your stool 2-3 days after the procedure. Get help right away if:  You have more than a small spotting of blood in your stool.  You pass large blood clots in your stool.  Your abdomen is swollen.  You have nausea or vomiting.  You have a fever.  You have increasing abdominal pain that is not relieved with medicine. This information is not intended to replace advice given to you by your health care provider. Make sure you discuss any  questions you have with your health care provider. Document Released: 03/18/2004 Document Revised: 04/28/2016 Document Reviewed: 10/16/2015 Elsevier Interactive Patient Education  [employer].

## 2020-08-09 ENCOUNTER — Other Ambulatory Visit (HOSPITAL_COMMUNITY)
Admission: RE | Admit: 2020-08-09 | Discharge: 2020-08-09 | Disposition: A | Discharge status: Home / Self Care | Payer: Medicare PPO | Source: Ambulatory Visit | Attending: Internal Medicine | Admitting: Internal Medicine

## 2020-08-09 ENCOUNTER — Encounter (HOSPITAL_COMMUNITY): Payer: Self-pay

## 2020-08-09 ENCOUNTER — Other Ambulatory Visit: Payer: Self-pay

## 2020-08-09 ENCOUNTER — Encounter (HOSPITAL_COMMUNITY)
Admission: RE | Admit: 2020-08-09 | Discharge: 2020-08-09 | Disposition: A | Discharge status: Home / Self Care | Payer: Medicare PPO | Source: Ambulatory Visit | Attending: Internal Medicine | Admitting: Internal Medicine

## 2020-08-09 DIAGNOSIS — Z01818 Encounter for other preprocedural examination: Secondary | ICD-10-CM | POA: Diagnosis not present

## 2020-08-09 DIAGNOSIS — Z20822 Contact with and (suspected) exposure to covid-19: Secondary | ICD-10-CM | POA: Insufficient documentation

## 2020-08-09 LAB — CBC WITH DIFFERENTIAL/PLATELET
Abs Immature Granulocytes: 0.03 10*3/uL (ref 0.00–0.07)
Basophils Absolute: 0 10*3/uL (ref 0.0–0.1)
Basophils Relative: 1 %
Eosinophils Absolute: 0.3 10*3/uL (ref 0.0–0.5)
Eosinophils Relative: 5 %
HCT: 31.4 % — ABNORMAL LOW (ref 36.0–46.0)
Hemoglobin: 9.4 g/dL — ABNORMAL LOW (ref 12.0–15.0)
Immature Granulocytes: 0 %
Lymphocytes Relative: 21 %
Lymphs Abs: 1.5 10*3/uL (ref 0.7–4.0)
MCH: 23.9 pg — ABNORMAL LOW (ref 26.0–34.0)
MCHC: 29.9 g/dL — ABNORMAL LOW (ref 30.0–36.0)
MCV: 79.9 fL — ABNORMAL LOW (ref 80.0–100.0)
Monocytes Absolute: 0.5 10*3/uL (ref 0.1–1.0)
Monocytes Relative: 8 %
Neutro Abs: 4.4 10*3/uL (ref 1.7–7.7)
Neutrophils Relative %: 65 %
Platelets: 250 10*3/uL (ref 150–400)
RBC: 3.93 MIL/uL (ref 3.87–5.11)
RDW: 16.3 % — ABNORMAL HIGH (ref 11.5–15.5)
WBC: 6.8 10*3/uL (ref 4.0–10.5)
nRBC: 0 % (ref 0.0–0.2)

## 2020-08-09 LAB — BASIC METABOLIC PANEL
Anion gap: 10 (ref 5–15)
BUN: 14 mg/dL (ref 8–23)
CO2: 25 mmol/L (ref 22–32)
Calcium: 8.7 mg/dL — ABNORMAL LOW (ref 8.9–10.3)
Chloride: 100 mmol/L (ref 98–111)
Creatinine, Ser: 0.85 mg/dL (ref 0.44–1.00)
GFR, Estimated: >60 mL/min (ref >60)
Glucose, Bld: 100 mg/dL — ABNORMAL HIGH (ref 70–99)
Potassium: 4.1 mmol/L (ref 3.5–5.1)
Sodium: 135 mmol/L (ref 135–145)

## 2020-08-09 LAB — SARS CORONAVIRUS 2 (TAT 6-24 HRS): SARS Coronavirus 2: NEGATIVE

## 2020-08-14 ENCOUNTER — Other Ambulatory Visit: Payer: Self-pay

## 2020-08-14 ENCOUNTER — Ambulatory Visit (HOSPITAL_COMMUNITY): Payer: Medicare PPO | Admitting: Anesthesiology

## 2020-08-14 ENCOUNTER — Encounter (HOSPITAL_COMMUNITY): Payer: Self-pay

## 2020-08-14 ENCOUNTER — Ambulatory Visit (HOSPITAL_COMMUNITY)
Admission: RE | Admit: 2020-08-14 | Discharge: 2020-08-14 | Disposition: A | Discharge status: Home / Self Care | Payer: Medicare PPO | Attending: Internal Medicine | Admitting: Internal Medicine

## 2020-08-14 ENCOUNTER — Encounter (HOSPITAL_COMMUNITY)
Admission: RE | Disposition: A | Discharge status: Home / Self Care | Payer: Self-pay | Source: Home / Self Care | Attending: Internal Medicine

## 2020-08-14 DIAGNOSIS — D509 Iron deficiency anemia, unspecified: Secondary | ICD-10-CM | POA: Diagnosis not present

## 2020-08-14 DIAGNOSIS — Z853 Personal history of malignant neoplasm of breast: Secondary | ICD-10-CM | POA: Diagnosis not present

## 2020-08-14 DIAGNOSIS — Z950 Presence of cardiac pacemaker: Secondary | ICD-10-CM | POA: Insufficient documentation

## 2020-08-14 DIAGNOSIS — K5730 Diverticulosis of large intestine without perforation or abscess without bleeding: Secondary | ICD-10-CM | POA: Diagnosis not present

## 2020-08-14 DIAGNOSIS — K648 Other hemorrhoids: Secondary | ICD-10-CM | POA: Diagnosis not present

## 2020-08-14 DIAGNOSIS — K219 Gastro-esophageal reflux disease without esophagitis: Secondary | ICD-10-CM | POA: Insufficient documentation

## 2020-08-14 DIAGNOSIS — Z79899 Other long term (current) drug therapy: Secondary | ICD-10-CM | POA: Insufficient documentation

## 2020-08-14 DIAGNOSIS — Z7984 Long term (current) use of oral hypoglycemic drugs: Secondary | ICD-10-CM | POA: Diagnosis not present

## 2020-08-14 DIAGNOSIS — K635 Polyp of colon: Secondary | ICD-10-CM | POA: Diagnosis not present

## 2020-08-14 DIAGNOSIS — D124 Benign neoplasm of descending colon: Secondary | ICD-10-CM | POA: Diagnosis not present

## 2020-08-14 HISTORY — PX: POLYPECTOMY: SHX5525

## 2020-08-14 HISTORY — PX: COLONOSCOPY WITH PROPOFOL: SHX5780

## 2020-08-14 LAB — GLUCOSE, CAPILLARY: Glucose-Capillary: 122 mg/dL — ABNORMAL HIGH (ref 70–99)

## 2020-08-14 SURGERY — COLONOSCOPY WITH PROPOFOL
Anesthesia: General

## 2020-08-14 MED ORDER — LACTATED RINGERS IV SOLN: INTRAVENOUS | Status: DC

## 2020-08-14 MED ORDER — PROPOFOL 10 MG/ML IV BOLUS
INTRAVENOUS | Status: DC | PRN
  Administered 2020-08-14: 20 mg via INTRAVENOUS
  Administered 2020-08-14: 50 mg via INTRAVENOUS
  Administered 2020-08-14: 20 mg via INTRAVENOUS
  Administered 2020-08-14: 30 mg via INTRAVENOUS

## 2020-08-14 MED ORDER — PROPOFOL 500 MG/50ML IV EMUL
INTRAVENOUS | Status: DC | PRN
  Administered 2020-08-14: 150 ug/kg/min via INTRAVENOUS

## 2020-08-14 MED ORDER — KETAMINE HCL 50 MG/5ML IJ SOSY
PREFILLED_SYRINGE | INTRAMUSCULAR | Status: AC
  Filled 2020-08-14: qty 5

## 2020-08-14 MED ORDER — STERILE WATER FOR IRRIGATION IR SOLN
Status: DC | PRN
  Administered 2020-08-14: 100 mL

## 2020-08-14 MED ORDER — KETAMINE HCL 10 MG/ML IJ SOLN
INTRAMUSCULAR | Status: DC | PRN
  Administered 2020-08-14: 20 mg via INTRAVENOUS

## 2020-08-14 NOTE — Discharge Instructions (Signed)
  Colonoscopy Discharge Instructions  Read the instructions outlined below and refer to this sheet in the next few weeks. These discharge instructions provide you with general information on caring for yourself after you leave the hospital. Your doctor may also give you specific instructions. While your treatment has been planned according to the most current medical practices available, unavoidable complications occasionally occur.   ACTIVITY  You may resume your regular activity, but move at a slower pace for the next 24 hours.   Take frequent rest periods for the next 24 hours.   Walking will help get rid of the air and reduce the bloated feeling in your belly (abdomen).   No driving for 24 hours (because of the medicine (anesthesia) used during the test).    Do not sign any important legal documents or operate any machinery for 24 hours (because of the anesthesia used during the test).  NUTRITION  Drink plenty of fluids.   You may resume your normal diet as instructed by your doctor.   Begin with a light meal and progress to your normal diet. Heavy or fried foods are harder to digest and may make you feel sick to your stomach (nauseated).   Avoid alcoholic beverages for 24 hours or as instructed.  MEDICATIONS  You may resume your normal medications unless your doctor tells you otherwise.  WHAT YOU CAN EXPECT TODAY  Some feelings of bloating in the abdomen.   Passage of more gas than usual.   Spotting of blood in your stool or on the toilet paper.  IF YOU HAD POLYPS REMOVED DURING THE COLONOSCOPY:  No aspirin products for 7 days or as instructed.   No alcohol for 7 days or as instructed.   Eat a soft diet for the next 24 hours.  FINDING OUT THE RESULTS OF YOUR TEST Not all test results are available during your visit. If your test results are not back during the visit, make an appointment with your caregiver to find out the results. Do not assume everything is normal if  you have not heard from your caregiver or the medical facility. It is important for you to follow up on all of your test results.  SEEK IMMEDIATE MEDICAL ATTENTION IF:  You have more than a spotting of blood in your stool.   Your belly is swollen (abdominal distention).   You are nauseated or vomiting.   You have a temperature over 101.   You have abdominal pain or discomfort that is severe or gets worse throughout the day.   Your colonoscopy revealed 1 polyp(s) which I removed successfully. Await pathology results, my office will contact you. I recommend repeating colonoscopy in 5 years for surveillance purposes.   We will consider capsule endoscopy as well as a swallowing test when you come back and see me in 2-3 months.    I hope you have a great rest of your week!  Heidi [name]. Marletta Lor, D.O. Gastroenterology and Hepatology [facility]

## 2020-08-14 NOTE — Op Note (Signed)
[facility] Patient Name: Heidi [name] Procedure Date: 08/14/2020 9:12 AM MRN: [id number] Date of Birth: Sep 04, 1955 Attending MD: Elon Alas. Abbey Chatters DO CSN: [id number] Age: 64 Admit Type: Outpatient Procedure:                Colonoscopy Indications:              Iron deficiency anemia Providers:                Elon Alas. Abbey Chatters, DO, Tammy Vaught, RN, Dereck Leep, Technician, Nelma Rothman, Technician Referring MD:              Medicines:                See the Anesthesia note for documentation of the                            administered medications Complications:            No immediate complications. Estimated Blood Loss:     Estimated blood loss was minimal. Procedure:                Pre-Anesthesia Assessment:                           - The anesthesia plan was to use monitored                            anesthesia care (MAC).                           After obtaining informed consent, the colonoscope                            was passed under direct vision. Throughout the                            procedure, the patient's blood pressure, pulse, and                            oxygen saturations were monitored continuously. The                            PCF-HQ190L ([id number]) scope was introduced through                            the anus and advanced to the the cecum, identified                            by appendiceal orifice and ileocecal valve. The                            colonoscopy was technically difficult and complex                            due to significant  looping and a tortuous colon.                            Successful completion of the procedure was aided by                            applying abdominal pressure. The patient tolerated                            the procedure well. The quality of the bowel                            preparation was evaluated using the BBPS [facility]                            Bowel  Preparation Scale) with scores of: Right                            Colon = 2 (minor amount of residual staining, small                            fragments of stool and/or opaque liquid, but mucosa                            seen well), Transverse Colon = 2 (minor amount of                            residual staining, small fragments of stool and/or                            opaque liquid, but mucosa seen well) and Left Colon                            = 2 (minor amount of residual staining, small                            fragments of stool and/or opaque liquid, but mucosa                            seen well). The total BBPS score equals 6. The                            quality of the bowel preparation was fair. Scope In: 9:29:50 AM Scope Out: 9:52:23 AM Scope Withdrawal Time: 0 hours 13 minutes 45 seconds  Total Procedure Duration: 0 hours 22 minutes 33 seconds  Findings:      The perianal and digital rectal examinations were normal.      Non-bleeding internal hemorrhoids were found during endoscopy.      Multiple small-mouthed diverticula were found in the sigmoid colon.      A 5 mm polyp was found in the descending colon. The polyp was sessile.       The polyp was removed with a cold snare. Resection and retrieval were  complete. Impression:               - Preparation of the colon was fair.                           - Non-bleeding internal hemorrhoids.                           - Diverticulosis in the sigmoid colon.                           - One 5 mm polyp in the descending colon, removed                            with a cold snare. Resected and retrieved. Moderate Sedation:      Per Anesthesia Care Recommendation:           - Patient has a contact number available for                            emergencies. The signs and symptoms of potential                            delayed complications were discussed with the                            patient. Return to normal  activities tomorrow.                            Written discharge instructions were provided to the                            patient.                           - Resume previous diet.                           - Continue present medications.                           - Await pathology results.                           - Repeat colonoscopy in 5 years for surveillance.                           - Return to GI clinic in 3 months. Consider small                            bowel capsule endoscopy for completeness. Also                            consider BPE for dysphagia. Procedure Code(s):        --- Professional ---  45385, Colonoscopy, flexible; with removal of                            tumor(s), polyp(s), or other lesion(s) by snare                            technique Diagnosis Code(s):        --- Professional ---                           K64.8, Other hemorrhoids                           K63.5, Polyp of colon                           D50.9, Iron deficiency anemia, unspecified                           K57.30, Diverticulosis of large intestine without                            perforation or abscess without bleeding CPT copyright 2019 American Medical Association. All rights reserved. The codes documented in this report are preliminary and upon coder review may  be revised to meet current compliance requirements. Elon Alas. Abbey Chatters, DO [place] Abbey Chatters, DO 08/14/2020 9:57:24 AM This report has been signed electronically. Number of Addenda: 0

## 2020-08-14 NOTE — Anesthesia Postprocedure Evaluation (Signed)
Anesthesia Post Note  Patient: Heidi [name]  Procedure(s) Performed: COLONOSCOPY WITH PROPOFOL (N/A ) POLYPECTOMY  Patient location during evaluation: PACU Anesthesia Type: General Level of consciousness: awake and alert Pain management: pain level controlled Vital Signs Assessment: post-procedure vital signs reviewed and stable Respiratory status: spontaneous breathing Cardiovascular status: blood pressure returned to baseline and stable Postop Assessment: no apparent nausea or vomiting Anesthetic complications: no   No complications documented.   Last Vitals:  Vitals:   08/14/20 0744 08/14/20 0958  BP: 123/76 134/65  Pulse: 64 61  Resp: 10 17  Temp: 37.3 C   SpO2: 95% 98%    Last Pain:  Vitals:   08/14/20 0922  TempSrc:   PainSc: 0-No pain                 Evelen Vazguez

## 2020-08-14 NOTE — H&P (Signed)
Primary Care Physician:  Janean Sarkichardson, Jennifer P, NP Primary Gastroenterologist:  Dr. Marletta Lorarver  Pre-Procedure History & Physical: HPI:  Heidi [name] is a 64 y.o. female is here for a colonoscopyfor Iron deficiency anemia.   Patient denies any family history of colorectal cancer.  Occasional melena..  No abdominal pain or unintentional weight loss.  Has chronic reflux well controlled on PPI.  Past Medical History:  Diagnosis Date  . Anemia    takes iron supplement  . Anxiety   . Arthritis   . Asthma    prn neb.  . Cancer [facility](HCC)    right breast cancer  . Depression   . Diabetes mellitus    IDDM  . Environmental allergies   . GERD (gastroesophageal reflux disease)   . Headache(784.0)    migraines  . History of MRSA infection 08/2012   right breast  . Hypertension    under control with med., has been on med. x 10-15 yr.  . Overactive bladder   . PONV (postoperative nausea and vomiting)    used scop patch 7/14-please use again per pt request  . Sjogren's disease (HCC)   . Sleep apnea    uses CPAP nightly    Past Surgical History:  Procedure Laterality Date  . ANKLE ARTHROSCOPY Left   . BIOPSY  05/15/2020   Procedure: BIOPSY;  Surgeon: Lanelle Balarver, Darcey Demma K, DO;  [facility];  Service: Endoscopy;;  gastric   . BREAST RECONSTRUCTION  05/24/2012   Procedure: BREAST RECONSTRUCTION;  Surgeon: Louisa SecondGerald Truesdale, MD;  Location: [facility];  Service: Plastics;  Laterality: Right;  NO FLEX HD  . BREAST RECONSTRUCTION Right 01/17/2013   Procedure: RIGHT BREAST RECONSTRUCTION WITH PLACEMENT OF TISSUE EXPANDER ;  Surgeon: Louisa SecondGerald Truesdale, MD;  Location: [facility];  Service: Plastics;  Laterality: Right;  . BREAST REDUCTION SURGERY Left 02/28/2013   Procedure: LEFT BREAST REDUCTION  (BREAST);  Surgeon: Louisa SecondGerald Truesdale, MD;  Location: [facility];  Service: Plastics;  Laterality: Left;  . CESAREAN SECTION    . ESOPHAGOGASTRODUODENOSCOPY  (EGD) WITH PROPOFOL N/A 05/15/2020   Procedure: ESOPHAGOGASTRODUODENOSCOPY (EGD) WITH PROPOFOL;  Surgeon: Lanelle Balarver, Bryla Burek K, DO;  [facility];  Service: Endoscopy;  Laterality: N/A;  3:00pm  . INSERT / REPLACE / REMOVE PACEMAKER    . MASTECTOMY Right 03/2012  . POLYPECTOMY  05/15/2020   Procedure: POLYPECTOMY;  Surgeon: Lanelle Balarver, Karver Fadden K, DO;  [facility];  Service: Endoscopy;;  antral  . REMOVAL OF TISSUE EXPANDER AND PLACEMENT OF IMPLANT Right 05/10/2013   Procedure: RIGHT REMOVAL OF TISSUE EXPANDER ;  Surgeon: Louisa SecondGerald Truesdale, MD;  Location: [facility];  Service: Plastics;  Laterality: Right;  . SHOULDER ARTHROSCOPY Right   . TISSUE EXPANDER PLACEMENT  05/24/2012   Procedure: TISSUE EXPANDER;  Surgeon: Louisa SecondGerald Truesdale, MD;  Location: [facility];  Service: Plastics;  Laterality: Right;    Prior to Admission medications   Medication Sig Start Date End Date Taking? Authorizing Provider  allopurinol (ZYLOPRIM) 100 MG tablet Take 100 mg by mouth daily.   Yes [provider]  benazepril-hydrochlorthiazide (LOTENSIN HCT) 20-25 MG tablet Take 1 tablet by mouth daily.   Yes [provider]  benztropine (COGENTIN) 0.5 MG tablet Take 0.5 mg by mouth 2 (two) times daily.   Yes [provider]  clonazePAM (KLONOPIN) 0.5 MG tablet Take 0.5 mg by mouth at bedtime as needed for anxiety (sleep).   Yes [provider]  colchicine 0.6 MG tablet Take 0.6 mg by mouth daily as needed (gout flare).   Yes [provider]  DULoxetine (CYMBALTA) 60 MG capsule Take 60 mg by mouth 2 (two) times daily.   Yes [provider]  furosemide (LASIX) 20 MG tablet Take 20 mg by mouth daily. 07/13/20  Yes [provider]  hydroxychloroquine (PLAQUENIL) 200 MG tablet Take 400 mg by mouth daily.   Yes [provider]  lamoTRIgine (LAMICTAL) 25 MG tablet Take 25 mg by mouth See admin instructions. Take 25 mg  at bedtime for 2 weeks then 25 mg twice daily 08/01/20  Yes [provider]  lidocaine (LIDODERM) 5 % Place 1 patch onto the skin daily as needed for pain. 06/16/20  Yes [provider]  metFORMIN (GLUCOPHAGE) 1000 MG tablet Take 1,000 mg by mouth 2 (two) times daily with a meal.   Yes [provider]  methadone (DOLOPHINE) 5 MG tablet Take 5 mg by mouth 2 (two) times daily.   Yes [provider]  modafinil (PROVIGIL) 200 MG tablet Take 200 mg by mouth daily.   Yes [provider]  omeprazole (PRILOSEC) 40 MG capsule Take 1 capsule (40 mg total) by mouth in the morning and at bedtime. 06/27/20  Yes Anice Paganini, NP  oxybutynin (DITROPAN) 5 MG tablet Take 5 mg by mouth 3 (three) times daily.   Yes [provider]  prednisoLONE acetate (PRED FORTE) 1 % ophthalmic suspension Place 1 drop into both eyes daily as needed (dry eyes). 07/05/20  Yes [provider]  pregabalin (LYRICA) 100 MG capsule Take 100 mg by mouth 2 (two) times daily.   Yes [provider]  propranolol (INDERAL) 10 MG tablet Take 10 mg by mouth 2 (two) times daily.   Yes [provider]    Allergies as of 06/21/2020 - Review Complete 05/15/2020  Allergen Reaction Noted  . Soap Other (See Comments) 02/22/2013  . Latex Itching 02/22/2013  . Sulfa antibiotics Rash 05/20/2012    Family History  Problem Relation Age of Onset  . Colon cancer Paternal Grandmother   . Colon cancer Paternal Grandfather     Social History   Socioeconomic History  . Marital status: Married    Spouse name: Not on file  . Number of children: Not on file  . Years of education: Not on file  . Highest education level: Not on file  Occupational History  . Not on file  Tobacco Use  . Smoking status: Never Smoker  . Smokeless tobacco: Never Used  Substance and Sexual Activity  . Alcohol use: No  . Drug use: No  . Sexual activity: Not Currently  Other Topics Concern   . Not on file  Social History Narrative  . Not on file   Social Determinants of Health   Financial Resource Strain: Not on file  Food Insecurity: Not on file  Transportation Needs: Not on file  Physical Activity: Not on file  Stress: Not on file  Social Connections: Not on file  Intimate Partner Violence: Not on file    Review of Systems: See HPI, otherwise negative ROS  Impression/Plan: Heidi [name] is here for a colonoscopy to be performed for Iron deficiency anemia.   The risks of the procedure including infection, bleed, or perforation as well as benefits, limitations, alternatives and imponderables have been reviewed with the patient. Questions have been answered. All parties agreeable.

## 2020-08-14 NOTE — Transfer of Care (Signed)
Immediate Anesthesia Transfer of Care Note  Patient: Heidi [name]  Procedure(s) Performed: COLONOSCOPY WITH PROPOFOL (N/A ) POLYPECTOMY  Patient Location: PACU  Anesthesia Type:General  Level of Consciousness: awake  Airway & Oxygen Therapy: Patient Spontanous Breathing  Post-op Assessment: Report given to RN  Post vital signs: Reviewed  Last Vitals:  Vitals Value Taken Time  BP 134/65 08/14/20 0958  Temp    Pulse 59 08/14/20 0959  Resp 16 08/14/20 0959  SpO2 100 % 08/14/20 0959  Vitals shown include unvalidated device data.  Last Pain:  Vitals:   08/14/20 0922  TempSrc:   PainSc: 0-No pain         Complications: No complications documented.

## 2020-08-14 NOTE — Anesthesia Preprocedure Evaluation (Signed)
Anesthesia Evaluation  Patient identified by MRN, date of birth, ID band Patient awake    Reviewed: Allergy & Precautions, H&P , NPO status , Patient's Chart, lab work & pertinent test results, reviewed documented beta blocker date and time   History of Anesthesia Complications (+) PONV and history of anesthetic complications  Airway Mallampati: II  TM Distance: >3 FB Neck ROM: full    Dental no notable dental hx.    Pulmonary asthma , sleep apnea and Continuous Positive Airway Pressure Ventilation ,    Pulmonary exam normal breath sounds clear to auscultation       Cardiovascular Exercise Tolerance: Good hypertension, negative cardio ROS   Rhythm:regular Rate:Normal     Neuro/Psych  Headaches, PSYCHIATRIC DISORDERS Anxiety Depression    GI/Hepatic Neg liver ROS, GERD  Medicated,  Endo/Other  diabetesMorbid obesity  Renal/GU negative Renal ROS  negative genitourinary   Musculoskeletal   Abdominal   Peds  Hematology  (+) Blood dyscrasia, anemia ,   Anesthesia Other Findings   Reproductive/Obstetrics negative OB ROS                             Anesthesia Physical Anesthesia Plan  ASA: III  Anesthesia Plan: General   Post-op Pain Management:    Induction:   PONV Risk Score and Plan: Propofol infusion  Airway Management Planned:   Additional Equipment:   Intra-op Plan:   Post-operative Plan:   Informed Consent: I have reviewed the patients History and Physical, chart, labs and discussed the procedure including the risks, benefits and alternatives for the proposed anesthesia with the patient or authorized representative who has indicated his/her understanding and acceptance.     Dental Advisory Given  Plan Discussed with: CRNA  Anesthesia Plan Comments:         Anesthesia Quick Evaluation

## 2020-08-15 LAB — SURGICAL PATHOLOGY

## 2020-08-21 ENCOUNTER — Encounter (HOSPITAL_COMMUNITY): Payer: Self-pay | Admitting: Internal Medicine

## 2020-10-13 ENCOUNTER — Emergency Department (HOSPITAL_COMMUNITY): Payer: Medicare PPO

## 2020-10-13 ENCOUNTER — Inpatient Hospital Stay (HOSPITAL_COMMUNITY)
Admission: EM | Admit: 2020-10-13 | Discharge: 2020-10-16 | DRG: 392 | Disposition: A | Discharge status: Home / Self Care | Payer: Medicare PPO | Attending: Internal Medicine | Admitting: Internal Medicine

## 2020-10-13 ENCOUNTER — Other Ambulatory Visit: Payer: Self-pay

## 2020-10-13 DIAGNOSIS — I495 Sick sinus syndrome: Secondary | ICD-10-CM | POA: Diagnosis present

## 2020-10-13 DIAGNOSIS — Z79899 Other long term (current) drug therapy: Secondary | ICD-10-CM

## 2020-10-13 DIAGNOSIS — E1165 Type 2 diabetes mellitus with hyperglycemia: Secondary | ICD-10-CM | POA: Diagnosis present

## 2020-10-13 DIAGNOSIS — M109 Gout, unspecified: Secondary | ICD-10-CM | POA: Diagnosis present

## 2020-10-13 DIAGNOSIS — Z8614 Personal history of Methicillin resistant Staphylococcus aureus infection: Secondary | ICD-10-CM

## 2020-10-13 DIAGNOSIS — Z882 Allergy status to sulfonamides status: Secondary | ICD-10-CM

## 2020-10-13 DIAGNOSIS — Z91040 Latex allergy status: Secondary | ICD-10-CM

## 2020-10-13 DIAGNOSIS — F32A Depression, unspecified: Secondary | ICD-10-CM | POA: Diagnosis present

## 2020-10-13 DIAGNOSIS — Z7984 Long term (current) use of oral hypoglycemic drugs: Secondary | ICD-10-CM

## 2020-10-13 DIAGNOSIS — Z20822 Contact with and (suspected) exposure to covid-19: Secondary | ICD-10-CM | POA: Diagnosis present

## 2020-10-13 DIAGNOSIS — D500 Iron deficiency anemia secondary to blood loss (chronic): Secondary | ICD-10-CM

## 2020-10-13 DIAGNOSIS — E8729 Other acidosis: Secondary | ICD-10-CM

## 2020-10-13 DIAGNOSIS — G894 Chronic pain syndrome: Secondary | ICD-10-CM | POA: Diagnosis present

## 2020-10-13 DIAGNOSIS — Z888 Allergy status to other drugs, medicaments and biological substances status: Secondary | ICD-10-CM

## 2020-10-13 DIAGNOSIS — K5732 Diverticulitis of large intestine without perforation or abscess without bleeding: Principal | ICD-10-CM | POA: Diagnosis present

## 2020-10-13 DIAGNOSIS — E1142 Type 2 diabetes mellitus with diabetic polyneuropathy: Secondary | ICD-10-CM | POA: Diagnosis present

## 2020-10-13 DIAGNOSIS — E872 Acidosis: Secondary | ICD-10-CM | POA: Diagnosis present

## 2020-10-13 DIAGNOSIS — Z950 Presence of cardiac pacemaker: Secondary | ICD-10-CM

## 2020-10-13 DIAGNOSIS — E871 Hypo-osmolality and hyponatremia: Secondary | ICD-10-CM | POA: Diagnosis present

## 2020-10-13 DIAGNOSIS — Z6841 Body Mass Index (BMI) 40.0 and over, adult: Secondary | ICD-10-CM

## 2020-10-13 DIAGNOSIS — K5792 Diverticulitis of intestine, part unspecified, without perforation or abscess without bleeding: Secondary | ICD-10-CM | POA: Diagnosis present

## 2020-10-13 DIAGNOSIS — K219 Gastro-esophageal reflux disease without esophagitis: Secondary | ICD-10-CM | POA: Diagnosis present

## 2020-10-13 DIAGNOSIS — E1169 Type 2 diabetes mellitus with other specified complication: Secondary | ICD-10-CM

## 2020-10-13 DIAGNOSIS — F419 Anxiety disorder, unspecified: Secondary | ICD-10-CM | POA: Diagnosis present

## 2020-10-13 DIAGNOSIS — I10 Essential (primary) hypertension: Secondary | ICD-10-CM | POA: Diagnosis present

## 2020-10-13 DIAGNOSIS — R531 Weakness: Secondary | ICD-10-CM

## 2020-10-13 DIAGNOSIS — G4733 Obstructive sleep apnea (adult) (pediatric): Secondary | ICD-10-CM | POA: Diagnosis present

## 2020-10-13 DIAGNOSIS — R55 Syncope and collapse: Secondary | ICD-10-CM

## 2020-10-13 DIAGNOSIS — E861 Hypovolemia: Secondary | ICD-10-CM | POA: Diagnosis present

## 2020-10-13 DIAGNOSIS — A419 Sepsis, unspecified organism: Secondary | ICD-10-CM

## 2020-10-13 DIAGNOSIS — E860 Dehydration: Secondary | ICD-10-CM | POA: Diagnosis present

## 2020-10-13 DIAGNOSIS — Z853 Personal history of malignant neoplasm of breast: Secondary | ICD-10-CM

## 2020-10-13 DIAGNOSIS — Z800 Family history of malignant neoplasm of digestive organs: Secondary | ICD-10-CM

## 2020-10-13 DIAGNOSIS — E785 Hyperlipidemia, unspecified: Secondary | ICD-10-CM | POA: Diagnosis present

## 2020-10-13 DIAGNOSIS — I4581 Long QT syndrome: Secondary | ICD-10-CM | POA: Diagnosis present

## 2020-10-13 DIAGNOSIS — I959 Hypotension, unspecified: Secondary | ICD-10-CM | POA: Diagnosis present

## 2020-10-13 DIAGNOSIS — R6520 Severe sepsis without septic shock: Secondary | ICD-10-CM

## 2020-10-13 DIAGNOSIS — M3500 Sicca syndrome, unspecified: Secondary | ICD-10-CM | POA: Diagnosis present

## 2020-10-13 DIAGNOSIS — E876 Hypokalemia: Secondary | ICD-10-CM | POA: Diagnosis present

## 2020-10-13 DIAGNOSIS — N179 Acute kidney failure, unspecified: Secondary | ICD-10-CM

## 2020-10-13 HISTORY — DX: Diverticulosis of intestine, part unspecified, without perforation or abscess without bleeding: K57.90

## 2020-10-13 LAB — COMPREHENSIVE METABOLIC PANEL
ALT: 11 U/L (ref 0–44)
AST: 18 U/L (ref 15–41)
Albumin: 3.5 g/dL (ref 3.5–5.0)
Alkaline Phosphatase: 87 U/L (ref 38–126)
Anion gap: 17 — ABNORMAL HIGH (ref 5–15)
BUN: 23 mg/dL (ref 8–23)
CO2: 17 mmol/L — ABNORMAL LOW (ref 22–32)
Calcium: 8.7 mg/dL — ABNORMAL LOW (ref 8.9–10.3)
Chloride: 97 mmol/L — ABNORMAL LOW (ref 98–111)
Creatinine, Ser: 1.44 mg/dL — ABNORMAL HIGH (ref 0.44–1.00)
GFR, Estimated: 41 mL/min — ABNORMAL LOW (ref >60)
Glucose, Bld: 181 mg/dL — ABNORMAL HIGH (ref 70–99)
Potassium: 3 mmol/L — ABNORMAL LOW (ref 3.5–5.1)
Sodium: 131 mmol/L — ABNORMAL LOW (ref 135–145)
Total Bilirubin: 0.5 mg/dL (ref 0.3–1.2)
Total Protein: 6.2 g/dL — ABNORMAL LOW (ref 6.5–8.1)

## 2020-10-13 LAB — CBC WITH DIFFERENTIAL/PLATELET
Abs Immature Granulocytes: 0.04 10*3/uL (ref 0.00–0.07)
Basophils Absolute: 0.1 10*3/uL (ref 0.0–0.1)
Basophils Relative: 1 %
Eosinophils Absolute: 0.1 10*3/uL (ref 0.0–0.5)
Eosinophils Relative: 1 %
HCT: 29.5 % — ABNORMAL LOW (ref 36.0–46.0)
Hemoglobin: 9.2 g/dL — ABNORMAL LOW (ref 12.0–15.0)
Immature Granulocytes: 1 %
Lymphocytes Relative: 10 %
Lymphs Abs: 0.8 10*3/uL (ref 0.7–4.0)
MCH: 24.5 pg — ABNORMAL LOW (ref 26.0–34.0)
MCHC: 31.2 g/dL (ref 30.0–36.0)
MCV: 78.7 fL — ABNORMAL LOW (ref 80.0–100.0)
Monocytes Absolute: 0.8 10*3/uL (ref 0.1–1.0)
Monocytes Relative: 10 %
Neutro Abs: 6.4 10*3/uL (ref 1.7–7.7)
Neutrophils Relative %: 77 %
Platelets: 226 10*3/uL (ref 150–400)
RBC: 3.75 MIL/uL — ABNORMAL LOW (ref 3.87–5.11)
RDW: 17.3 % — ABNORMAL HIGH (ref 11.5–15.5)
WBC: 8.2 10*3/uL (ref 4.0–10.5)
nRBC: 0 % (ref 0.0–0.2)

## 2020-10-13 LAB — TROPONIN I (HIGH SENSITIVITY): Troponin I (High Sensitivity): 5 ng/L (ref <18)

## 2020-10-13 LAB — RESP PANEL BY RT-PCR (FLU A&B, COVID) ARPGX2
Influenza A by PCR: NEGATIVE
Influenza B by PCR: NEGATIVE
SARS Coronavirus 2 by RT PCR: NEGATIVE

## 2020-10-13 LAB — LACTIC ACID, PLASMA: Lactic Acid, Venous: 5.2 mmol/L (ref 0.5–1.9)

## 2020-10-13 MED ORDER — LACTATED RINGERS IV BOLUS
1000.0000 mL | Freq: Once | INTRAVENOUS | Status: AC
  Administered 2020-10-13: 1000 mL via INTRAVENOUS

## 2020-10-13 NOTE — ED Provider Notes (Signed)
23:20: Assumed care of patient from Dr. Mellody Dance @ change of shift pending CT & admission. Please see prior provider note for full H&P.  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   Patient's work-up has been reviewed: CBC: Mild anemia similar to prior.  No significant leukocytosis. CMP: Mild increase in creatinine, low bicarb, several mild electrolyte abnormalities. Lactic acid: Grossly elevated at 5.2 initially, improved to 3.3 with repeat. Troponin: Within normal limits  Chest x-ray: No active disease CT abdomen/pelvis: Findings which may be suggestive of focal mild sigmoid colonic diverticulitis, no abscess/free air, questionable findings of ileus.  CT A/P:  Findings which may be suggestive of focal mild sigmoid colonic diverticulitis. No pericolonic fluid collections or free air. Mildly dilated focal segment of distal ileum which could be due to a ileus. Aortic Atherosclerosis.   Given CT findings suggestive of mild diverticulitis with hypotension lactic acidosis considering possible sepsis, however patient is afebrile without leukocytosis, tachypnea, or tachycardia.  She has received 2 L of lactated Ringer's, her ideal body weight 30 cc/kg bolus is 1800 cc therefore she has received this with improvement in her blood pressure.  1/3 L of LR will be ordered.  Antibiotics ordered.  We will also obtain blood cultures.  Plan discussed with hospital service for admission.  Updated patient on results and plan of care, she is in agreement.  03:05: CONSULT: Discussed with hospitalist Dr. Margo Aye- accepts admission.   Results for orders placed or performed during the hospital encounter of 10/13/20  Resp Panel by RT-PCR (Flu A&B, Covid) Nasopharyngeal Swab   Specimen: Nasopharyngeal Swab; Nasopharyngeal(NP) swabs in vial transport medium  Result  Value Ref Range   SARS Coronavirus 2 by RT PCR NEGATIVE NEGATIVE   Influenza A by PCR NEGATIVE NEGATIVE   Influenza B by PCR NEGATIVE NEGATIVE  CBC with Differential  Result Value Ref Range   WBC 8.2 4.0 - 10.5 K/uL   RBC 3.75 (L) 3.87 - 5.11 MIL/uL   Hemoglobin 9.2 (L) 12.0 - 15.0 g/dL   HCT 13.0 (L) 86.5 - 78.4 %   MCV 78.7 (L) 80.0 - 100.0 fL   MCH 24.5 (L) 26.0 - 34.0 pg   MCHC 31.2 30.0 - 36.0 g/dL   RDW 69.6 (H) 29.5 - 28.4 %   Platelets 226 150 - 400 K/uL   nRBC 0.0 0.0 - 0.2 %   Neutrophils Relative % 77 %   Neutro Abs 6.4 1.7 - 7.7 K/uL   Lymphocytes Relative 10 %   Lymphs Abs 0.8 0.7 - 4.0 K/uL   Monocytes Relative 10 %   Monocytes Absolute 0.8 0.1 - 1.0 K/uL   Eosinophils Relative 1 %   Eosinophils Absolute 0.1 0.0 - 0.5 K/uL   Basophils Relative 1 %   Basophils Absolute 0.1 0.0 - 0.1 K/uL   Immature Granulocytes 1 %   Abs Immature Granulocytes 0.04 0.00 - 0.07 K/uL  Comprehensive metabolic panel  Result Value Ref Range   Sodium 131 (L) 135 - 145 mmol/L   Potassium 3.0 (L) 3.5 - 5.1 mmol/L   Chloride 97 (L) 98 - 111 mmol/L   CO2 17 (L) 22 - 32 mmol/L   Glucose, Bld 181 (H) 70 - 99 mg/dL   BUN 23 8 - 23 mg/dL   Creatinine, Ser 1.32 (H) 0.44 - 1.00 mg/dL  Calcium 8.7 (L) 8.9 - 10.3 mg/dL   Total Protein 6.2 (L) 6.5 - 8.1 g/dL   Albumin 3.5 3.5 - 5.0 g/dL   AST 18 15 - 41 U/L   ALT 11 0 - 44 U/L   Alkaline Phosphatase 87 38 - 126 U/L   Total Bilirubin 0.5 0.3 - 1.2 mg/dL   GFR, Estimated 41 (L) >60 mL/min   Anion gap 17 (H) 5 - 15  Lactic acid, plasma  Result Value Ref Range   Lactic Acid, Venous 5.2 (HH) 0.5 - 1.9 mmol/L  Lactic acid, plasma  Result Value Ref Range   Lactic Acid, Venous 3.3 (HH) 0.5 - 1.9 mmol/L  Troponin I (High Sensitivity)  Result Value Ref Range   Troponin I (High Sensitivity) 5 <18 ng/L  Troponin I (High Sensitivity)  Result Value Ref Range   Troponin I (High Sensitivity) 4 <18 ng/L   CT ABDOMEN PELVIS W CONTRAST  Result  Date: 10/14/2020 CLINICAL DATA:  Weakness EXAM: CT ABDOMEN AND PELVIS WITH CONTRAST TECHNIQUE: Multidetector CT imaging of the abdomen and pelvis was performed using the standard protocol following bolus administration of intravenous contrast. CONTRAST:  80mL OMNIPAQUE IOHEXOL 300 MG/ML  SOLN COMPARISON:  None. FINDINGS: Lower chest: The visualized heart size within normal limits. No pericardial fluid/thickening. No hiatal hernia. The visualized portions of the lungs are clear. Hepatobiliary: The liver is normal in density without focal abnormality.The main portal vein is patent. No evidence of calcified gallstones, gallbladder wall thickening or biliary dilatation. Pancreas: Unremarkable. No pancreatic ductal dilatation or surrounding inflammatory changes. Spleen: Normal in size without focal abnormality. Adrenals/Urinary Tract: Both adrenal glands appear normal. The kidneys and collecting system appear normal without evidence of urinary tract calculus or hydronephrosis. Bladder is unremarkable. Stomach/Bowel: The stomach and proximal small bowel are unremarkable. There is a mildly dilated fluid and air-filled loop of distal ileum measuring up to 3.2 cm. No wall thickening or transition point. The terminal ileum is decompressed. There appears to be question of mild wall thickening with stranding changes seen within the proximal sigmoid colon with scattered diverticula. A moderate amount of colonic stool seen throughout. No loculated fluid collections or free air. Vascular/Lymphatic: There are no enlarged mesenteric, retroperitoneal, or pelvic lymph nodes. Scattered aortic atherosclerotic calcifications are seen without aneurysmal dilatation. Reproductive: No abnormality seen within the deep pelvis. Other: No evidence of abdominal wall mass or hernia. Musculoskeletal: No acute or significant osseous findings. IMPRESSION: Findings which may be suggestive of focal mild sigmoid colonic diverticulitis. No pericolonic  fluid collections or free air. Mildly dilated focal segment of distal ileum which could be due to a ileus. Aortic Atherosclerosis (ICD10-I70.0). Electronically Signed   By: Jonna Clark M.D.   On: 10/14/2020 01:35   DG Chest Portable 1 View  Result Date: 10/13/2020 CLINICAL DATA:  Near syncope EXAM: PORTABLE CHEST 1 VIEW COMPARISON:  None. FINDINGS: The heart size and mediastinal contours are within normal limits. A right-sided pacemaker seen with the lead tips in the right atrium right ventricle. Both lungs are clear. The visualized skeletal structures are unremarkable. IMPRESSION: No active disease. Electronically Signed   By: Jonna Clark M.D.   On: 10/13/2020 21:15      Cherly Anderson, PA-C 10/14/20 [id number]    Shon Baton, MD 10/15/20 0100

## 2020-10-13 NOTE — ED Triage Notes (Signed)
BIB [facility] from [facility]. Pt states she was sitting there eating and she started to feel funny and became very weak. Pt was assisted to the bathroom when she started to feel like she couldn't walk. EMS reports orthostatic hypotension.  Hx: Diabetes, HTN.  CBG 200 SBP when standing 90s.

## 2020-10-13 NOTE — ED Provider Notes (Signed)
[facility] EMERGENCY DEPARTMENT Provider Note   CSN: [id number] Arrival date & time: 10/13/20  2022     History Chief Complaint  Patient presents with  . Weakness    Heidi [name] is a 65 y.o. female w/ h/o Sjogren's Disease, OSA on CPAP, HTN, T2DM, IDA, anxiety, depression, and breast CA s/p resection who presents to the ED via EMS for generalized weakness and near-syncope. Awoke this AM in usual state of health. While at dinner earlier this evening, patient suddenly felt warm and weak all over. When attempting to stand, he legs felt as if they were going to give out. She was helped to bathroom where she a bowel movement with no improvement of her symptoms. EMS then called and transported to ED for further evaluation. Denies headache, vision changes, slurred speech, hearing loss, fever, chills, chest pain, SOB, cough, N/V/D, abdominal pain, GI bleed, or urinary symptoms.  The history is provided by the patient, medical records and the EMS personnel.  Weakness Severity:  Moderate Onset quality:  Sudden Duration: just prior to arrival. Timing:  Constant Progression:  Unchanged Chronicity:  New Context comment:  While at dinner Relieved by:  Nothing Worsened by:  Standing Ineffective treatments: bowel movement. Associated symptoms: no abdominal pain, no arthralgias, no chest pain, no cough, no diarrhea, no dizziness, no dysuria, no numbness in extremities, no falls, no fever, no headaches, no hematochezia, no loss of consciousness, no nausea, no near-syncope, no seizures, no shortness of breath, no syncope and no vomiting   Risk factors: anemia and diabetes   Risk factors: no new medications   Risk factors comment:  Bradycardia s/p pacemaker      Past Medical History:  Diagnosis Date  . Anemia    takes iron supplement  . Anxiety   . Arthritis   . Asthma    prn neb.  . Cancer [facility] [place])    right breast cancer  . Depression   . Diabetes mellitus    IDDM   . Environmental allergies   . GERD (gastroesophageal reflux disease)   . Headache(784.0)    migraines  . History of MRSA infection 08/2012   right breast  . Hypertension    under control with med., has been on med. x 10-15 yr.  . Overactive bladder   . PONV (postoperative nausea and vomiting)    used scop patch 7/14-please use again per pt request  . Sjogren's disease ([place])   . Sleep apnea    uses CPAP nightly    Patient Active Problem List   Diagnosis Date Noted  . IDA (iron deficiency anemia) 04/10/2020  . Dark stools 04/10/2020  . GERD (gastroesophageal reflux disease) 08/04/2017  . Sjogren's disease ([place]) 08/04/2017  . Barrett's esophagus 02/05/2017  . Constipation 02/05/2017  . Gastric polyps 02/05/2017  . History of colonic polyps 02/05/2017  . Abdominal pain 02/05/2017  . Loss of weight 02/05/2017  . Wound infection after surgery 09/23/2012    Past Surgical History:  Procedure Laterality Date  . ANKLE ARTHROSCOPY Left   . BIOPSY  05/15/2020   Procedure: BIOPSY;  Surgeon: Eloise Harman, DO;  [facility];  Service: Endoscopy;;  gastric   . BREAST RECONSTRUCTION  05/24/2012   Procedure: BREAST RECONSTRUCTION;  Surgeon: Cristine Polio, MD;  Location: [place];  Service: Plastics;  Laterality: Right;  NO FLEX HD  . BREAST RECONSTRUCTION Right 01/17/2013   Procedure: RIGHT BREAST RECONSTRUCTION WITH PLACEMENT OF TISSUE EXPANDER ;  Surgeon: Cristine Polio, MD;  Location: [place];  Service: Plastics;  Laterality: Right;  . BREAST REDUCTION SURGERY Left 02/28/2013   Procedure: LEFT BREAST REDUCTION  (BREAST);  Surgeon: Cristine Polio, MD;  Location: [place];  Service: Plastics;  Laterality: Left;  . CESAREAN SECTION    . COLONOSCOPY WITH PROPOFOL N/A 08/14/2020   Procedure: COLONOSCOPY WITH PROPOFOL;  Surgeon: Eloise Harman, DO;  [facility];  Service: Endoscopy;  Laterality: N/A;  8:30am   . ESOPHAGOGASTRODUODENOSCOPY (EGD) WITH PROPOFOL N/A 05/15/2020   Procedure: ESOPHAGOGASTRODUODENOSCOPY (EGD) WITH PROPOFOL;  Surgeon: Eloise Harman, DO;  [facility];  Service: Endoscopy;  Laterality: N/A;  3:00pm  . INSERT / REPLACE / REMOVE PACEMAKER    . MASTECTOMY Right 03/2012  . POLYPECTOMY  05/15/2020   Procedure: POLYPECTOMY;  Surgeon: Eloise Harman, DO;  [facility];  Service: Endoscopy;;  antral  . POLYPECTOMY  08/14/2020   Procedure: POLYPECTOMY;  Surgeon: Eloise Harman, DO;  [facility];  Service: Endoscopy;;  . REMOVAL OF TISSUE EXPANDER AND PLACEMENT OF IMPLANT Right 05/10/2013   Procedure: RIGHT REMOVAL OF TISSUE EXPANDER ;  Surgeon: Cristine Polio, MD;  Location: [place];  Service: Plastics;  Laterality: Right;  . SHOULDER ARTHROSCOPY Right   . TISSUE EXPANDER PLACEMENT  05/24/2012   Procedure: TISSUE EXPANDER;  Surgeon: Cristine Polio, MD;  Location: [place];  Service: Plastics;  Laterality: Right;     OB History   No obstetric history on file.     Family History  Problem Relation Age of Onset  . Colon cancer Paternal Grandmother   . Colon cancer Paternal Grandfather     Social History   Tobacco Use  . Smoking status: Never Smoker  . Smokeless tobacco: Never Used  Substance Use Topics  . Alcohol use: No  . Drug use: No    Home Medications Prior to Admission medications   Medication Sig Start Date End Date Taking? Authorizing Provider  allopurinol (ZYLOPRIM) 100 MG tablet Take 100 mg by mouth daily.    [provider]  benazepril-hydrochlorthiazide (LOTENSIN HCT) 20-25 MG tablet Take 1 tablet by mouth daily.    [provider]  benztropine (COGENTIN) 0.5 MG tablet Take 0.5 mg by mouth 2 (two) times daily.    [provider]  clonazePAM (KLONOPIN) 0.5 MG tablet Take 0.5 mg by mouth at bedtime as needed for anxiety (sleep).    [provider]  colchicine 0.6 MG tablet Take 0.6 mg by mouth daily as needed (gout flare).    [provider]  DULoxetine (CYMBALTA) 60 MG capsule Take 60 mg by mouth 2 (two) times daily.    [provider]  furosemide (LASIX) 20 MG tablet Take 20 mg by mouth daily. 07/13/20   [provider]  hydroxychloroquine (PLAQUENIL) 200 MG tablet Take 400 mg by mouth daily.    [provider]  lamoTRIgine (LAMICTAL) 25 MG tablet Take 25 mg by mouth See admin instructions. Take 25 mg at bedtime for 2 weeks then 25 mg twice daily 08/01/20   [provider]  lidocaine (LIDODERM) 5 % Place 1 patch onto the skin daily as needed for pain. 06/16/20   [provider]  metFORMIN (GLUCOPHAGE) 1000 MG tablet Take 1,000 mg by mouth 2 (two) times daily with a meal.    [provider]  methadone (DOLOPHINE) 5 MG tablet Take 5 mg  by mouth 2 (two) times daily.    [provider]  modafinil (PROVIGIL) 200 MG tablet Take 200 mg by mouth daily.    [provider]  omeprazole (PRILOSEC) 40 MG capsule Take 1 capsule (40 mg total) by mouth in the morning and at bedtime. 06/27/20   Carlis Stable, NP  oxybutynin (DITROPAN) 5 MG tablet Take 5 mg by mouth 3 (three) times daily.    [provider]  prednisoLONE acetate (PRED FORTE) 1 % ophthalmic suspension Place 1 drop into both eyes daily as needed (dry eyes). 07/05/20   [provider]  pregabalin (LYRICA) 100 MG capsule Take 100 mg by mouth 2 (two) times daily.    [provider]  propranolol (INDERAL) 10 MG tablet Take 10 mg by mouth 2 (two) times daily.    [provider]    Allergies    Soap, Vraylar [cariprazine], Latex, and Sulfa antibiotics  Review of Systems   Review of Systems  Constitutional: Positive for fatigue. Negative for chills and fever.  HENT: Negative for ear pain and sore throat.   Eyes: Negative for pain and visual disturbance.  Respiratory: Negative  for cough and shortness of breath.   Cardiovascular: Negative for chest pain, palpitations, syncope and near-syncope.  Gastrointestinal: Negative for abdominal pain, diarrhea, hematochezia, nausea and vomiting.  Genitourinary: Negative for dysuria and hematuria.  Musculoskeletal: Negative for arthralgias, back pain and falls.  Skin: Negative for color change and rash.  Neurological: Positive for weakness. Negative for dizziness, seizures, loss of consciousness, syncope and headaches.  All other systems reviewed and are negative.   Physical Exam Updated Vital Signs BP 103/64   Pulse (!) 59   Temp 98 F (36.7 C) (Oral)   Resp 20   Ht 5\' 6"  (1.676 m)   Wt 121.6 kg   LMP 05/20/2012   SpO2 93%   BMI 43.26 kg/m   Physical Exam Vitals and nursing note reviewed.  Constitutional:      General: She is awake. She is not in acute distress.    Appearance: She is well-developed, well-groomed and well-nourished. She is obese. She is ill-appearing.  HENT:     Head: Normocephalic and atraumatic.     Right Ear: External ear normal.     Left Ear: External ear normal.     Nose: Nose normal.  Eyes:     General: No visual field deficit or scleral icterus.       Right eye: No discharge.        Left eye: No discharge.     Extraocular Movements: Extraocular movements intact.     Conjunctiva/sclera: Conjunctivae normal.     Pupils: Pupils are equal, round, and reactive to light.  Cardiovascular:     Rate and Rhythm: Regular rhythm. Bradycardia present.     Pulses: Normal pulses.     Heart sounds: Normal heart sounds. No murmur heard.   Pulmonary:     Effort: Pulmonary effort is normal. No respiratory distress.     Breath sounds: Normal breath sounds. No wheezing, rhonchi or rales.  Abdominal:     General: Abdomen is flat. There is no distension.     Palpations: Abdomen is soft.     Tenderness: There is no abdominal tenderness. There is no guarding or rebound.  Musculoskeletal:         General: No edema.     Cervical back: Neck supple.     Right lower leg: No edema.  Left lower leg: No edema.  Skin:    General: Skin is warm and dry.     Findings: No rash.  Neurological:     General: No focal deficit present.     Mental Status: She is alert and oriented to person, place, and time.     GCS: GCS eye subscore is 4. GCS verbal subscore is 5. GCS motor subscore is 6.     Cranial Nerves: Cranial nerves are intact. No cranial nerve deficit, dysarthria or facial asymmetry.     Sensory: Sensation is intact. No sensory deficit.     Motor: Motor function is intact. No weakness.  Psychiatric:        Mood and Affect: Mood and affect and mood normal.        Behavior: Behavior normal. Behavior is cooperative.     ED Results / Procedures / Treatments   Labs (all labs ordered are listed, but only abnormal results are displayed) Labs Reviewed  CBC WITH DIFFERENTIAL/PLATELET - Abnormal; Notable for the following components:      Result Value   RBC 3.75 (*)    Hemoglobin 9.2 (*)    HCT 29.5 (*)    MCV 78.7 (*)    MCH 24.5 (*)    RDW 17.3 (*)    All other components within normal limits  COMPREHENSIVE METABOLIC PANEL - Abnormal; Notable for the following components:   Sodium 131 (*)    Potassium 3.0 (*)    Chloride 97 (*)    CO2 17 (*)    Glucose, Bld 181 (*)    Creatinine, Ser 1.44 (*)    Calcium 8.7 (*)    Total Protein 6.2 (*)    GFR, Estimated 41 (*)    Anion gap 17 (*)    All other components within normal limits  LACTIC ACID, PLASMA - Abnormal; Notable for the following components:   Lactic Acid, Venous 5.2 (*)    All other components within normal limits  RESP PANEL BY RT-PCR (FLU A&B, COVID) ARPGX2  URINALYSIS, ROUTINE W REFLEX MICROSCOPIC  LACTIC ACID, PLASMA  TROPONIN I (HIGH SENSITIVITY)  TROPONIN I (HIGH SENSITIVITY)    EKG EKG Interpretation  Date/Time:  Saturday October 13 2020 20:32:33 EST Ventricular Rate:  61 PR Interval:    QRS  Duration: 111 QT Interval:  614 QTC Calculation: 619 R Axis:   29 Text Interpretation: Sinus rhythm Abnormal R-wave progression, early transition Minimal ST depression, inferior leads Prolonged QT interval Confirmed by Isla Pence [phone]) on 10/13/2020 8:45:04 PM   Radiology DG Chest Portable 1 View  Result Date: 10/13/2020 CLINICAL DATA:  Near syncope EXAM: PORTABLE CHEST 1 VIEW COMPARISON:  None. FINDINGS: The heart size and mediastinal contours are within normal limits. A right-sided pacemaker seen with the lead tips in the right atrium right ventricle. Both lungs are clear. The visualized skeletal structures are unremarkable. IMPRESSION: No active disease. Electronically Signed   By: Prudencio Pair M.D.   On: 10/13/2020 21:15    Procedures Procedures  Medications Ordered in ED Medications  lactated ringers bolus 1,000 mL (0 mLs Intravenous Stopped 10/13/20 2213)  lactated ringers bolus 1,000 mL (1,000 mLs Intravenous New Bag/Given 10/13/20 2149)    ED Course  I have reviewed the triage vital signs and the nursing notes.  Pertinent labs & imaging results that were available during my care of the patient were reviewed by me and considered in my medical decision making (see chart for details).    MDM  Rules/Calculators/A&P                          Patient is a 66yoF with history and physical as described above who presents to the ED for generalized weakness. Initial VS notable for hypotension with SBP 80s and MAPs upper 50s. HR 60s and satting well on room air. Afebrile. Patient pale and ill-appearing. She is mentating appropriately, awake, and alert. IVF bolus initiated with improvement of BP to 937J systolic. Initial workup included CBC, CMP, troponin, lactic acid, and COVID. Will interrogate patient's pacemaker as well to assess for dysrhythmia and proper functioning.  Labs notable for lactic acid 5.2, K 3.0, Cr 1.44, and bicarb 17. Hgb 9.2 and stable from 08/09/20. Given elevated  lactic acid, decision made to obtain CT abdomen/pelvis for concern of possible intraabdominal pathology. At time of sign out, CT imaging still pending. Following IVF resuscitation, BP improved with MAPs 70s. Plan will be to trend lactic acid and follow up CT scan. Anticipate admission overnight for further workup of near-syncopal event, hypotension, and lactic acidosis. Antibiotics not given given afebrile, no tachycardia, no tachypnea, and no leukocytosis.  Transfer of care at 2300 to [facility]. Please refer to her note for further MDM and ultimate disposition. Patient in stable condition at time of sign out.  Final Clinical Impression(s) / ED Diagnoses Final diagnoses:  Weakness  Hypotension, unspecified hypotension type      Christy Gentles, MD 10/13/20 Joie Bimler    Isla Pence, MD 10/14/20 [id number]

## 2020-10-14 ENCOUNTER — Emergency Department (HOSPITAL_COMMUNITY): Payer: Medicare PPO

## 2020-10-14 DIAGNOSIS — E8729 Other acidosis: Secondary | ICD-10-CM

## 2020-10-14 DIAGNOSIS — N179 Acute kidney failure, unspecified: Secondary | ICD-10-CM | POA: Diagnosis present

## 2020-10-14 DIAGNOSIS — E1165 Type 2 diabetes mellitus with hyperglycemia: Secondary | ICD-10-CM | POA: Diagnosis present

## 2020-10-14 DIAGNOSIS — M109 Gout, unspecified: Secondary | ICD-10-CM | POA: Diagnosis present

## 2020-10-14 DIAGNOSIS — E872 Acidosis: Secondary | ICD-10-CM

## 2020-10-14 DIAGNOSIS — I959 Hypotension, unspecified: Secondary | ICD-10-CM | POA: Diagnosis present

## 2020-10-14 DIAGNOSIS — E876 Hypokalemia: Secondary | ICD-10-CM | POA: Diagnosis present

## 2020-10-14 DIAGNOSIS — E785 Hyperlipidemia, unspecified: Secondary | ICD-10-CM

## 2020-10-14 DIAGNOSIS — Z853 Personal history of malignant neoplasm of breast: Secondary | ICD-10-CM | POA: Diagnosis not present

## 2020-10-14 DIAGNOSIS — Z950 Presence of cardiac pacemaker: Secondary | ICD-10-CM | POA: Diagnosis not present

## 2020-10-14 DIAGNOSIS — E1169 Type 2 diabetes mellitus with other specified complication: Secondary | ICD-10-CM | POA: Diagnosis present

## 2020-10-14 DIAGNOSIS — Z6841 Body Mass Index (BMI) 40.0 and over, adult: Secondary | ICD-10-CM | POA: Diagnosis not present

## 2020-10-14 DIAGNOSIS — R55 Syncope and collapse: Secondary | ICD-10-CM

## 2020-10-14 DIAGNOSIS — N189 Chronic kidney disease, unspecified: Secondary | ICD-10-CM

## 2020-10-14 DIAGNOSIS — R6520 Severe sepsis without septic shock: Secondary | ICD-10-CM

## 2020-10-14 DIAGNOSIS — A419 Sepsis, unspecified organism: Secondary | ICD-10-CM

## 2020-10-14 DIAGNOSIS — E861 Hypovolemia: Secondary | ICD-10-CM | POA: Diagnosis present

## 2020-10-14 DIAGNOSIS — G894 Chronic pain syndrome: Secondary | ICD-10-CM | POA: Diagnosis present

## 2020-10-14 DIAGNOSIS — E860 Dehydration: Secondary | ICD-10-CM | POA: Diagnosis present

## 2020-10-14 DIAGNOSIS — K5792 Diverticulitis of intestine, part unspecified, without perforation or abscess without bleeding: Secondary | ICD-10-CM | POA: Diagnosis present

## 2020-10-14 DIAGNOSIS — E1142 Type 2 diabetes mellitus with diabetic polyneuropathy: Secondary | ICD-10-CM | POA: Diagnosis present

## 2020-10-14 DIAGNOSIS — K5732 Diverticulitis of large intestine without perforation or abscess without bleeding: Secondary | ICD-10-CM | POA: Diagnosis present

## 2020-10-14 DIAGNOSIS — Z20822 Contact with and (suspected) exposure to covid-19: Secondary | ICD-10-CM | POA: Diagnosis present

## 2020-10-14 DIAGNOSIS — I495 Sick sinus syndrome: Secondary | ICD-10-CM | POA: Diagnosis present

## 2020-10-14 DIAGNOSIS — D500 Iron deficiency anemia secondary to blood loss (chronic): Secondary | ICD-10-CM | POA: Diagnosis present

## 2020-10-14 DIAGNOSIS — E871 Hypo-osmolality and hyponatremia: Secondary | ICD-10-CM | POA: Diagnosis present

## 2020-10-14 DIAGNOSIS — I4581 Long QT syndrome: Secondary | ICD-10-CM | POA: Diagnosis present

## 2020-10-14 LAB — GLUCOSE, CAPILLARY
Glucose-Capillary: 94 mg/dL (ref 70–99)
Glucose-Capillary: 136 mg/dL — ABNORMAL HIGH (ref 70–99)
Glucose-Capillary: 184 mg/dL — ABNORMAL HIGH (ref 70–99)
Glucose-Capillary: 110 mg/dL — ABNORMAL HIGH (ref 70–99)

## 2020-10-14 LAB — CBC WITH DIFFERENTIAL/PLATELET
Abs Immature Granulocytes: 0.02 10*3/uL (ref 0.00–0.07)
Basophils Absolute: 0 10*3/uL (ref 0.0–0.1)
Basophils Relative: 1 %
Eosinophils Absolute: 0.1 10*3/uL (ref 0.0–0.5)
Eosinophils Relative: 1 %
HCT: 28.1 % — ABNORMAL LOW (ref 36.0–46.0)
Hemoglobin: 8.8 g/dL — ABNORMAL LOW (ref 12.0–15.0)
Immature Granulocytes: 0 %
Lymphocytes Relative: 24 %
Lymphs Abs: 1.3 10*3/uL (ref 0.7–4.0)
MCH: 23.9 pg — ABNORMAL LOW (ref 26.0–34.0)
MCHC: 31.3 g/dL (ref 30.0–36.0)
MCV: 76.4 fL — ABNORMAL LOW (ref 80.0–100.0)
Monocytes Absolute: 0.6 10*3/uL (ref 0.1–1.0)
Monocytes Relative: 11 %
Neutro Abs: 3.3 10*3/uL (ref 1.7–7.7)
Neutrophils Relative %: 63 %
Platelets: 183 10*3/uL (ref 150–400)
RBC: 3.68 MIL/uL — ABNORMAL LOW (ref 3.87–5.11)
RDW: 17.2 % — ABNORMAL HIGH (ref 11.5–15.5)
WBC: 5.3 10*3/uL (ref 4.0–10.5)
nRBC: 0 % (ref 0.0–0.2)

## 2020-10-14 LAB — COMPREHENSIVE METABOLIC PANEL
ALT: 11 U/L (ref 0–44)
AST: 16 U/L (ref 15–41)
Albumin: 3 g/dL — ABNORMAL LOW (ref 3.5–5.0)
Alkaline Phosphatase: 67 U/L (ref 38–126)
Anion gap: 11 (ref 5–15)
BUN: 22 mg/dL (ref 8–23)
CO2: 22 mmol/L (ref 22–32)
Calcium: 8.4 mg/dL — ABNORMAL LOW (ref 8.9–10.3)
Chloride: 101 mmol/L (ref 98–111)
Creatinine, Ser: 1.19 mg/dL — ABNORMAL HIGH (ref 0.44–1.00)
GFR, Estimated: 51 mL/min — ABNORMAL LOW (ref >60)
Glucose, Bld: 122 mg/dL — ABNORMAL HIGH (ref 70–99)
Potassium: 3.5 mmol/L (ref 3.5–5.1)
Sodium: 134 mmol/L — ABNORMAL LOW (ref 135–145)
Total Bilirubin: 0.7 mg/dL (ref 0.3–1.2)
Total Protein: 5.1 g/dL — ABNORMAL LOW (ref 6.5–8.1)

## 2020-10-14 LAB — LACTIC ACID, PLASMA
Lactic Acid, Venous: 3.3 mmol/L (ref 0.5–1.9)
Lactic Acid, Venous: 2.6 mmol/L (ref 0.5–1.9)
Lactic Acid, Venous: 2 mmol/L (ref 0.5–1.9)

## 2020-10-14 LAB — HEMOGLOBIN A1C
Hgb A1c MFr Bld: 6 % — ABNORMAL HIGH (ref 4.8–5.6)
Mean Plasma Glucose: 125.5 mg/dL

## 2020-10-14 LAB — TROPONIN I (HIGH SENSITIVITY): Troponin I (High Sensitivity): 4 ng/L (ref <18)

## 2020-10-14 LAB — MAGNESIUM: Magnesium: 1.6 mg/dL — ABNORMAL LOW (ref 1.7–2.4)

## 2020-10-14 MED ORDER — OXYBUTYNIN CHLORIDE 5 MG PO TABS
5.0000 mg | ORAL_TABLET | Freq: Three times a day (TID) | ORAL | Status: DC
Start: 2020-10-14 — End: 2020-10-16
  Administered 2020-10-14 – 2020-10-16 (×7): 5 mg via ORAL
  Filled 2020-10-14 (×10): qty 1

## 2020-10-14 MED ORDER — HYDROXYCHLOROQUINE SULFATE 200 MG PO TABS
400.0000 mg | ORAL_TABLET | Freq: Every day | ORAL | Status: DC
  Administered 2020-10-14 – 2020-10-16 (×3): 400 mg via ORAL
  Filled 2020-10-14 (×3): qty 2

## 2020-10-14 MED ORDER — SODIUM CHLORIDE 0.9 % IV SOLN
2.0000 g | Freq: Every day | INTRAVENOUS | Status: DC
  Administered 2020-10-14 – 2020-10-15 (×2): 2 g via INTRAVENOUS
  Filled 2020-10-14: qty 2
  Filled 2020-10-14: qty 20

## 2020-10-14 MED ORDER — POTASSIUM CHLORIDE IN NACL 40-0.9 MEQ/L-% IV SOLN
INTRAVENOUS | Status: AC
  Filled 2020-10-14 (×3): qty 1000

## 2020-10-14 MED ORDER — LAMOTRIGINE 25 MG PO TABS
25.0000 mg | ORAL_TABLET | Freq: Two times a day (BID) | ORAL | Status: DC
  Administered 2020-10-14 – 2020-10-16 (×5): 25 mg via ORAL
  Filled 2020-10-14 (×5): qty 1

## 2020-10-14 MED ORDER — PREDNISOLONE ACETATE 1 % OP SUSP: 1.0000 [drp] | Freq: Every day | OPHTHALMIC | Status: DC | PRN

## 2020-10-14 MED ORDER — SODIUM CHLORIDE 0.9 % IV SOLN: 2.0000 g | Freq: Once | INTRAVENOUS | Status: DC

## 2020-10-14 MED ORDER — HYDROMORPHONE HCL 1 MG/ML IJ SOLN
0.5000 mg | INTRAMUSCULAR | Status: DC | PRN
  Administered 2020-10-14 – 2020-10-15 (×4): 1 mg via INTRAVENOUS
  Filled 2020-10-14 (×4): qty 1

## 2020-10-14 MED ORDER — PREGABALIN 100 MG PO CAPS
100.0000 mg | ORAL_CAPSULE | Freq: Two times a day (BID) | ORAL | Status: DC
  Administered 2020-10-14 – 2020-10-16 (×5): 100 mg via ORAL
  Filled 2020-10-14 (×5): qty 1

## 2020-10-14 MED ORDER — METRONIDAZOLE IN NACL 5-0.79 MG/ML-% IV SOLN
500.0000 mg | Freq: Once | INTRAVENOUS | Status: DC
  Administered 2020-10-14: 500 mg via INTRAVENOUS

## 2020-10-14 MED ORDER — ALLOPURINOL 100 MG PO TABS
100.0000 mg | ORAL_TABLET | Freq: Every day | ORAL | Status: DC
  Administered 2020-10-14 – 2020-10-16 (×3): 100 mg via ORAL
  Filled 2020-10-14 (×3): qty 1

## 2020-10-14 MED ORDER — METHADONE HCL 10 MG PO TABS
5.0000 mg | ORAL_TABLET | Freq: Two times a day (BID) | ORAL | Status: DC
  Administered 2020-10-14 – 2020-10-16 (×5): 5 mg via ORAL
  Filled 2020-10-14 (×5): qty 1

## 2020-10-14 MED ORDER — MODAFINIL 100 MG PO TABS
200.0000 mg | ORAL_TABLET | Freq: Every day | ORAL | Status: DC
  Administered 2020-10-14 – 2020-10-16 (×3): 200 mg via ORAL
  Filled 2020-10-14 (×3): qty 2

## 2020-10-14 MED ORDER — INSULIN ASPART 100 UNIT/ML ~~LOC~~ SOLN: 0.0000 [IU] | Freq: Every day | SUBCUTANEOUS | Status: DC

## 2020-10-14 MED ORDER — IOHEXOL 300 MG/ML  SOLN
80.0000 mL | Freq: Once | INTRAMUSCULAR | Status: AC | PRN
  Administered 2020-10-14: 80 mL via INTRAVENOUS

## 2020-10-14 MED ORDER — BENZTROPINE MESYLATE 1 MG PO TABS
0.5000 mg | ORAL_TABLET | Freq: Two times a day (BID) | ORAL | Status: DC
  Administered 2020-10-14 – 2020-10-16 (×5): 0.5 mg via ORAL
  Filled 2020-10-14 (×6): qty 1

## 2020-10-14 MED ORDER — CLONAZEPAM 0.5 MG PO TABS: 0.5000 mg | ORAL_TABLET | Freq: Every evening | ORAL | Status: DC | PRN

## 2020-10-14 MED ORDER — DULOXETINE HCL 60 MG PO CPEP
60.0000 mg | ORAL_CAPSULE | Freq: Two times a day (BID) | ORAL | Status: DC
  Administered 2020-10-14 – 2020-10-16 (×5): 60 mg via ORAL
  Filled 2020-10-14 (×5): qty 1

## 2020-10-14 MED ORDER — POTASSIUM CHLORIDE 10 MEQ/100ML IV SOLN
10.0000 meq | INTRAVENOUS | Status: AC
  Administered 2020-10-14: 10 meq via INTRAVENOUS
  Filled 2020-10-14: qty 100

## 2020-10-14 MED ORDER — LACTATED RINGERS IV BOLUS
1000.0000 mL | Freq: Once | INTRAVENOUS | Status: AC
  Administered 2020-10-14: 1000 mL via INTRAVENOUS

## 2020-10-14 MED ORDER — MAGNESIUM SULFATE 2 GM/50ML IV SOLN
2.0000 g | Freq: Once | INTRAVENOUS | Status: AC
  Administered 2020-10-14: 2 g via INTRAVENOUS
  Filled 2020-10-14: qty 50

## 2020-10-14 MED ORDER — METRONIDAZOLE IN NACL 5-0.79 MG/ML-% IV SOLN
500.0000 mg | Freq: Three times a day (TID) | INTRAVENOUS | Status: DC
  Administered 2020-10-14 – 2020-10-15 (×3): 500 mg via INTRAVENOUS
  Filled 2020-10-14 (×4): qty 100

## 2020-10-14 MED ORDER — PANTOPRAZOLE SODIUM 40 MG PO TBEC
40.0000 mg | DELAYED_RELEASE_TABLET | Freq: Every day | ORAL | Status: DC
  Administered 2020-10-14 – 2020-10-16 (×3): 40 mg via ORAL
  Filled 2020-10-14 (×3): qty 1

## 2020-10-14 MED ORDER — INSULIN ASPART 100 UNIT/ML ~~LOC~~ SOLN
0.0000 [IU] | Freq: Three times a day (TID) | SUBCUTANEOUS | Status: DC
  Administered 2020-10-14: 3 [IU] via SUBCUTANEOUS
  Administered 2020-10-14: 2 [IU] via SUBCUTANEOUS

## 2020-10-14 MED ORDER — LIDOCAINE 5 % EX PTCH: 1.0000 | MEDICATED_PATCH | Freq: Every day | CUTANEOUS | Status: DC | PRN

## 2020-10-14 MED ORDER — ENOXAPARIN SODIUM 40 MG/0.4ML ~~LOC~~ SOLN
40.0000 mg | Freq: Every day | SUBCUTANEOUS | Status: DC
  Administered 2020-10-14 – 2020-10-15 (×2): 40 mg via SUBCUTANEOUS
  Filled 2020-10-14 (×2): qty 0.4

## 2020-10-14 NOTE — Evaluation (Addendum)
Occupational Therapy Evaluation Patient Details Name: Heidi [name] MRN: [id number] DOB: [birth year] Today's Date: 10/14/2020    History of Present Illness Pt is a 65 y.o. F with significant PMH of diabetes mellitus type 2, HTN, tachybradycardia syndrome s/p pacemaker, breast cancer, chronic pain syndrome on methadone who presents with severe sepsis due to acute diverticulitis.   Clinical Impression   Pt PTA: Pt lives with family and reports independence with ADL and mobility. Pt currently, slow and cautious for ADL and mobility. Pt performing own modified independence for ADL standing at sink and ambulation in room with RW. Pt with VSS on RA. Pulse ox with questionable readings, but stayed ~>88% on RA. Pt plans to use reacher for LB dressing and plans to sit as needed for LB ADL. Pt has supportive family to assist as needed given her BUE rotator cuff tears. Pt with no focal deficits noted and no pain reported. Pt does not require continued OT skilled services. OT signing off.     Follow Up Recommendations  No OT follow up    Equipment Recommendations  None recommended by OT    Recommendations for Other Services       Precautions / Restrictions Precautions Precautions: None Restrictions Weight Bearing Restrictions: No      Mobility Bed Mobility               General bed mobility comments: in recliner pre and post session    Transfers Overall transfer level: Independent Equipment used: Rolling walker (2 wheeled)                  Balance Overall balance assessment: Mild deficits observed, not formally tested                                         ADL either performed or assessed with clinical judgement   ADL Overall ADL's : Modified independent;At baseline                                       General ADL Comments: Pt slow and cautious for ADL and mobility. Pt performing own modified independence for ADL standing at sink  and ambulation in room. Pt with VSS on RA. Pulse ox with questionable readings ~88% on RA. Pt plans to use reacher for LB dressing and plans to sit as needed for LB ADL.     Vision Baseline Vision/History: Wears glasses Wears Glasses: At all times Patient Visual Report: No change from baseline Vision Assessment?: No apparent visual deficits     Perception     Praxis      Pertinent Vitals/Pain Pain Assessment: Faces Faces Pain Scale: Hurts a little bit Pain Location: abdomen Pain Descriptors / Indicators: Cramping Pain Intervention(s): Monitored during session     Hand Dominance     Extremity/Trunk Assessment Upper Extremity Assessment Upper Extremity Assessment: RUE deficits/detail;LUE deficits/detail RUE Deficits / Details: rotator cuff tear FF to 80* LUE Deficits / Details: partial rotator cuff tear 90* FF   Lower Extremity Assessment Lower Extremity Assessment: Overall WFL for tasks assessed   Cervical / Trunk Assessment Cervical / Trunk Assessment: Normal   Communication Communication Communication: No difficulties   Cognition Arousal/Alertness: Awake/alert Behavior During Therapy: WFL for tasks assessed/performed Overall Cognitive Status: Within Functional Limits for tasks assessed  General Comments       Exercises     Shoulder Instructions      Home Living Family/patient expects to be discharged to:: Private residence Living Arrangements: Spouse/significant other;Children;Other (Comment) (grandchild) Available Help at Discharge: Family Type of Home: House Home Access: Level entry     Home Layout: One level     Bathroom Shower/Tub: Tub/shower unit         Home Equipment: Tub bench;Walker - 4 wheels;Cane - single point          Prior Functioning/Environment Level of Independence: Independent with assistive device(s)        Comments: Uses cane, limited community ambulator        OT  Problem List:        OT Treatment/Interventions:      OT Goals(Current goals can be found in the care plan section) Acute Rehab OT Goals Patient Stated Goal: did not state OT Goal Formulation: All assessment and education complete, DC therapy  OT Frequency:     Barriers to D/C:            Co-evaluation              AM-PAC OT "6 Clicks" Daily Activity     Outcome Measure Help from another person eating meals?: None Help from another person taking care of personal grooming?: None Help from another person toileting, which includes using toliet, bedpan, or urinal?: None Help from another person bathing (including washing, rinsing, drying)?: A Little Help from another person to put on and taking off regular upper body clothing?: None Help from another person to put on and taking off regular lower body clothing?: None 6 Click Score: 23   End of Session Equipment Utilized During Treatment: Rolling walker Nurse Communication: Mobility status  Activity Tolerance: Patient tolerated treatment well Patient left: in chair;with call bell/phone within reach  OT Visit Diagnosis: Unsteadiness on feet (R26.81)                Time: 1610-9604 OT Time Calculation (min): 20 min Charges:  OT General Charges $OT Visit: 1 Visit OT Evaluation $OT Eval Moderate Complexity: 1 Mod  Jefferey Pica, OTR/L Acute Rehabilitation Services Pager: [phone] Office: [phone]   Heidi [name] C 10/14/2020, 1:25 PM

## 2020-10-14 NOTE — Progress Notes (Addendum)
[facility] PROGRESS NOTE    Progress Note  Heidi [name]  KZL:[id number] DOB: 17-Nov-1955 DOA: 10/13/2020 PCP: Wendi Maya, NP     Brief Narrative:   Heidi [name] is an 65 y.o. 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, she Sjgren on Plaquenil, chronic pain syndrome on methadone the hospital for abdominal pain, with a last colonoscopy on 08/14/2020 that revealed diverticulosis, CT scan of the abdomen and pelvis on admission showed diverticulitis she was started empirically on Rocephin and Flagyl  Significant studies: 08/14/2019 CT scan with contrast of the abdomen pelvis showed sigmoid colonic diverticulitis Antibiotics: 08/13/2021 Rocephin and Flagyl  Microbiology data: 08/13/2021 blood culture:  Procedures: None  Assessment/Plan:   Severe sepsis due to acute Diverticulitis: CT scan as above. With mild lactic acidosis in the setting of severe sepsis she has been fluid resuscitated started empirically on Rocephin and Flagyl. Blood cultures have been ordered and she was allowed clear liquid diet.  Near syncopal episode: In the setting of severe sepsis with hypovolemia. I will go ahead and recheck orthostatics   High anion gap metabolic acidosis: In the setting of sepsis and acute kidney injury. Lactic acid of 5 she was fluid resuscitated her lactic acidosis is resolved. Contributing to this lactic acidosis will start Metformin which was held on admission.  Acute kidney injury Likely hemodynamically mediated in the setting of sepsis and diuretic use. With a baseline creatinine of 0.8 on presentation 1.4 antihypertensive medications and diuretics were held. She was fluid resuscitated and her creatinine is trending down.  Prolonged QTC Likely due to electrolyte imbalance, try to keep potassium greater  than 4 magnesium greater than 2. Repeated twelve-lead EKG in the morning.  Hypokalemia/hypomagnesemia: Replete potassium IV recheck in the morning. Magnesium 1.6 will give a bolus of IV.  Hypovolemic hyponatremia Recently with IV fluids continue and recheck in the morning.  Iron deficiency anemia Noted  Chronic anxiety/depression Continue current home regimen of Klonopin and Cymbalta.  Diabetes mellitus type 2 A1c pending, on a clear liquid diet continue sliding scale insulin hold all oral hypoglycemic agents.  Gout Continue allopurinol no flares.  Chronic pain syndrome Continue methadone.  Morbid obesity: Noted with a BMI greater than 45  DVT prophylaxis: Lovenox Family Communication: None Status is: Inpatient  Remains inpatient appropriate because:Hemodynamically unstable   Dispo:  Patient From: Home  Planned Disposition: Home  Medically stable for discharge: No          Code Status:     Code Status Orders  (From admission, onward)         Start     Ordered   10/14/20 0408  Full code  Continuous        10/14/20 0407        Code Status History    This patient has a current code status but no historical code status.   Advance Care Planning Activity        IV Access:    Peripheral IV   Procedures and diagnostic studies:   CT ABDOMEN PELVIS W CONTRAST  Result Date: 10/14/2020 CLINICAL DATA:  Weakness EXAM: CT ABDOMEN AND PELVIS WITH CONTRAST TECHNIQUE: Multidetector CT imaging of the abdomen and pelvis was performed using the standard protocol following bolus administration of intravenous contrast. CONTRAST:  50mL OMNIPAQUE IOHEXOL 300 MG/ML  SOLN COMPARISON:  None. FINDINGS: Lower chest: The visualized heart size within  normal limits. No pericardial fluid/thickening. No hiatal hernia. The visualized portions of the lungs are clear. Hepatobiliary: The liver is normal in density without focal abnormality.The main portal vein is patent. No  evidence of calcified gallstones, gallbladder wall thickening or biliary dilatation. Pancreas: Unremarkable. No pancreatic ductal dilatation or surrounding inflammatory changes. Spleen: Normal in size without focal abnormality. Adrenals/Urinary Tract: Both adrenal glands appear normal. The kidneys and collecting system appear normal without evidence of urinary tract calculus or hydronephrosis. Bladder is unremarkable. Stomach/Bowel: The stomach and proximal small bowel are unremarkable. There is a mildly dilated fluid and air-filled loop of distal ileum measuring up to 3.2 cm. No wall thickening or transition point. The terminal ileum is decompressed. There appears to be question of mild wall thickening with stranding changes seen within the proximal sigmoid colon with scattered diverticula. A moderate amount of colonic stool seen throughout. No loculated fluid collections or free air. Vascular/Lymphatic: There are no enlarged mesenteric, retroperitoneal, or pelvic lymph nodes. Scattered aortic atherosclerotic calcifications are seen without aneurysmal dilatation. Reproductive: No abnormality seen within the deep pelvis. Other: No evidence of abdominal wall mass or hernia. Musculoskeletal: No acute or significant osseous findings. IMPRESSION: Findings which may be suggestive of focal mild sigmoid colonic diverticulitis. No pericolonic fluid collections or free air. Mildly dilated focal segment of distal ileum which could be due to a ileus. Aortic Atherosclerosis (ICD10-I70.0). Electronically Signed   By: Prudencio Pair M.D.   On: 10/14/2020 01:35   DG Chest Portable 1 View  Result Date: 10/13/2020 CLINICAL DATA:  Near syncope EXAM: PORTABLE CHEST 1 VIEW COMPARISON:  None. FINDINGS: The heart size and mediastinal contours are within normal limits. A right-sided pacemaker seen with the lead tips in the right atrium right ventricle. Both lungs are clear. The visualized skeletal structures are unremarkable.  IMPRESSION: No active disease. Electronically Signed   By: Prudencio Pair M.D.   On: 10/13/2020 21:15     Medical Consultants:    None.   Subjective:    Heidi [name] she is anorexic denies abdominal pain.  Objective:    Vitals:   10/14/20 0215 10/14/20 0230 10/14/20 0300 10/14/20 0420  BP: 105/63 (!) 99/56 (!) 100/58 (!) 118/57  Pulse: (!) 50 (!) 59 (!) 59 60  Resp:  16 16 18   Temp:   98.2 F (36.8 C) 98.3 F (36.8 C)  TempSrc:    Oral  SpO2: 98% 92% 95% 100%  Weight:    124.3 kg  Height:       SpO2: 100 %   Intake/Output Summary (Last 24 hours) at 10/14/2020 0654 Last data filed at 10/14/2020 0022 Gross per 24 hour  Intake 2000 ml  Output --  Net 2000 ml   Filed Weights   10/13/20 2029 10/14/20 0420  Weight: 121.6 kg 124.3 kg    Exam: General exam: In no acute distress, morbidly obese Respiratory system: Good air movement and clear to auscultation. Cardiovascular system: S1 & S2 heard, RRR. No JVD.  Gastrointestinal system: Abdomen is nondistended soft with tenderness in the left lower quadrant Extremities: No pedal edema. Skin: No rashes, lesions or ulcers Psychiatry: Judgement and insight appear normal. Mood & affect appropriate.    Data Reviewed:    Labs: Basic Metabolic Panel: Recent Labs  Lab 10/13/20 2041 10/14/20 0533  NA 131* 134*  K 3.0* 3.5  CL 97* 101  CO2 17* 22  GLUCOSE 181* 122*  BUN 23 22  CREATININE 1.44* 1.19*  CALCIUM 8.7* 8.4*  MG  --  1.6*   GFR Estimated Creatinine Clearance: 65.2 mL/min (A) (by C-G formula based on SCr of 1.19 mg/dL (H)). Liver Function Tests: Recent Labs  Lab 10/13/20 2041 10/14/20 0533  AST 18 16  ALT 11 11  ALKPHOS 87 67  BILITOT 0.5 0.7  PROT 6.2* 5.1*  ALBUMIN 3.5 3.0*   No results for input(s): LIPASE, AMYLASE in the last 168 hours. No results for input(s): AMMONIA in the last 168 hours. Coagulation profile No results for input(s): INR, PROTIME in the last 168 hours. COVID-19  Labs  No results for input(s): DDIMER, FERRITIN, LDH, CRP in the last 72 hours.  Lab Results  Component Value Date   SARSCOV2NAA NEGATIVE 10/13/2020   SARSCOV2NAA NEGATIVE 08/09/2020   [place] NEGATIVE 05/14/2020    CBC: Recent Labs  Lab 10/13/20 2041 10/14/20 0533  WBC 8.2 5.3  NEUTROABS 6.4 3.3  HGB 9.2* 8.8*  HCT 29.5* 28.1*  MCV 78.7* 76.4*  PLT 226 183   Cardiac Enzymes: No results for input(s): CKTOTAL, CKMB, CKMBINDEX, TROPONINI in the last 168 hours. BNP (last 3 results) No results for input(s): PROBNP in the last 8760 hours. CBG: Recent Labs  Lab 10/14/20 0651  GLUCAP 94   D-Dimer: No results for input(s): DDIMER in the last 72 hours. Hgb A1c: No results for input(s): HGBA1C in the last 72 hours. Lipid Profile: No results for input(s): CHOL, HDL, LDLCALC, TRIG, CHOLHDL, LDLDIRECT in the last 72 hours. Thyroid function studies: No results for input(s): TSH, T4TOTAL, T3FREE, THYROIDAB in the last 72 hours.  Invalid input(s): FREET3 Anemia work up: No results for input(s): VITAMINB12, FOLATE, FERRITIN, TIBC, IRON, RETICCTPCT in the last 72 hours. Sepsis Labs: Recent Labs  Lab 10/13/20 0143 10/13/20 2041 10/13/20 2145 10/14/20 0533  WBC  --  8.2  --  5.3  LATICACIDVEN 3.3*  --  5.2* 2.6*   Microbiology Recent Results (from the past 240 hour(s))  Resp Panel by RT-PCR (Flu A&B, Covid) Nasopharyngeal Swab     Status: None   Collection Time: 10/13/20  9:06 PM   Specimen: Nasopharyngeal Swab; Nasopharyngeal(NP) swabs in vial transport medium  Result Value Ref Range Status   SARS Coronavirus 2 by RT PCR NEGATIVE NEGATIVE Final    Comment: (NOTE) SARS-CoV-2 target nucleic acids are NOT DETECTED.  The SARS-CoV-2 RNA is generally detectable in upper respiratory specimens during the acute phase of infection. The lowest concentration of SARS-CoV-2 viral copies this assay can detect is 138 copies/mL. A negative result does not preclude  SARS-Cov-2 infection and should not be used as the sole basis for treatment or other patient management decisions. A negative result may occur with  improper specimen collection/handling, submission of specimen other than nasopharyngeal swab, presence of viral mutation(s) within the areas targeted by this assay, and inadequate number of viral copies(<138 copies/mL). A negative result must be combined with clinical observations, patient history, and epidemiological information. The expected result is Negative.  Fact Sheet for Patients:  EntrepreneurPulse.com.au  Fact Sheet for Healthcare Providers:  IncredibleEmployment.be  This test is no t yet approved or cleared by the [place] FDA and  has been authorized for detection and/or diagnosis of SARS-CoV-2 by FDA under an Emergency Use Authorization (EUA). This EUA will remain  in effect (meaning this test can be used) for the duration of the COVID-19 declaration under Section 564(b)(1) of the Act, 21 U.S.C.section 360bbb-3(b)(1), unless the authorization is terminated  or revoked sooner.  Influenza A by PCR NEGATIVE NEGATIVE Final   Influenza B by PCR NEGATIVE NEGATIVE Final    Comment: (NOTE) The Xpert Xpress SARS-CoV-2/FLU/RSV plus assay is intended as an aid in the diagnosis of influenza from Nasopharyngeal swab specimens and should not be used as a sole basis for treatment. Nasal washings and aspirates are unacceptable for Xpert Xpress SARS-CoV-2/FLU/RSV testing.  Fact Sheet for Patients: EntrepreneurPulse.com.au  Fact Sheet for Healthcare Providers: IncredibleEmployment.be  This test is not yet approved or cleared by the [place] FDA and has been authorized for detection and/or diagnosis of SARS-CoV-2 by FDA under an Emergency Use Authorization (EUA). This EUA will remain in effect (meaning this test can be used) for the duration of  the COVID-19 declaration under Section 564(b)(1) of the Act, 21 U.S.C. section 360bbb-3(b)(1), unless the authorization is terminated or revoked.  Performed at [facility], [place] [address] [place], [place] [address]      Medications:   . benztropine  0.5 mg Oral BID  . DULoxetine  60 mg Oral BID  . enoxaparin (LOVENOX) injection  40 mg Subcutaneous Daily  . hydroxychloroquine  400 mg Oral Daily  . insulin aspart  0-15 Units Subcutaneous TID WC  . insulin aspart  0-5 Units Subcutaneous QHS  . lamoTRIgine  25 mg Oral BID  . methadone  5 mg Oral BID  . modafinil  200 mg Oral Q breakfast  . oxybutynin  5 mg Oral TID  . pantoprazole  40 mg Oral Daily  . pregabalin  100 mg Oral BID   Continuous Infusions: . 0.9 % NaCl with KCl 40 mEq / L 75 mL/hr at 10/14/20 0551  . cefTRIAXone (ROCEPHIN)  IV    . metronidazole        LOS: 0 days   Charlynne Cousins  [facility]  10/14/2020, 6:54 AM

## 2020-10-14 NOTE — ED Notes (Signed)
[phone] pt daughter wiuld like a update. thnks

## 2020-10-14 NOTE — Evaluation (Signed)
Physical Therapy Evaluation and Discharge Patient Details Name: Heidi [name] MRN: [id number] DOB: [birth year] Today's Date: 10/14/2020   History of Present Illness  Pt is a 65 y.o. F with significant PMH of diabetes mellitus type 2, HTN, tachybradycardia syndrome s/p pacemaker, breast cancer, chronic pain syndrome on methadone who presents with severe sepsis due to acute diverticulitis.  Clinical Impression  Patient evaluated by Physical Therapy with no further acute PT needs identified. Orthostatics being assessed upon entry by NT and were negative (see below). Pt denies dizziness/lightheadedness; does endorse mild abdominal cramping. Prior to admission, pt lives with her family and is a limited community ambulator using a cane. On PT evaluation, pt ambulating x 200 feet with a walker without physical difficulty. Encouraged continued ambulation while inpatient. All education has been completed and the patient has no further questions. No follow-up Physical Therapy or equipment needs. PT is signing off. Thank you for this referral.  Orthostatic Vitals: Supine: 103/57 Sitting: 103/69 Standing: 123/80     Follow Up Recommendations No PT follow up    Equipment Recommendations  None recommended by PT    Recommendations for Other Services       Precautions / Restrictions Precautions Precautions: None Restrictions Weight Bearing Restrictions: No      Mobility  Bed Mobility               General bed mobility comments: Standing upon arrival    Transfers Overall transfer level: Independent Equipment used: None                Ambulation/Gait Ambulation/Gait assistance: Modified independent (Device/Increase time) Gait Distance (Feet): 200 Feet Assistive device: Rolling walker (2 wheeled) Gait Pattern/deviations: Step-through pattern;Decreased stride length Gait velocity: decreased   General Gait Details: Slow and steady pace  Research officer, trade [employer]    Modified Rankin (Stroke Patients Only)       Balance Overall balance assessment: Mild deficits observed, not formally tested                                           Pertinent Vitals/Pain Pain Assessment: Faces Faces Pain Scale: Hurts a little bit Pain Location: abdomen Pain Descriptors / Indicators: Cramping Pain Intervention(s): Monitored during session    Home Living Family/patient expects to be discharged to:: Private residence Living Arrangements: Spouse/significant other;Children;Other (Comment) (grandchild) Available Help at Discharge: Family Type of Home: House Home Access: Level entry     Home Layout: One level Home Equipment: Tub bench;Walker - 4 wheels;Cane - single point      Prior Function Level of Independence: Independent with assistive device(s)         Comments: Uses cane, limited community ambulator     Hand Dominance        Extremity/Trunk Assessment   Upper Extremity Assessment Upper Extremity Assessment: Defer to OT evaluation    Lower Extremity Assessment Lower Extremity Assessment: Overall WFL for tasks assessed    Cervical / Trunk Assessment Cervical / Trunk Assessment: Normal  Communication   Communication: No difficulties  Cognition Arousal/Alertness: Awake/alert Behavior During Therapy: WFL for tasks assessed/performed Overall Cognitive Status: Within Functional Limits for tasks assessed  General Comments      Exercises     Assessment/Plan    PT Assessment Patent does not need any further PT services  PT Problem List         PT Treatment Interventions      PT Goals (Current goals can be found in the Care Plan section)  Acute Rehab PT Goals Patient Stated Goal: did not state PT Goal Formulation: All assessment and education complete, DC therapy    Frequency     Barriers to discharge        Co-evaluation                AM-PAC PT "6 Clicks" Mobility  Outcome Measure Help needed turning from your back to your side while in a flat bed without using bedrails?: None Help needed moving from lying on your back to sitting on the side of a flat bed without using bedrails?: None Help needed moving to and from a bed to a chair (including a wheelchair)?: None Help needed standing up from a chair using your arms (e.g., wheelchair or bedside chair)?: None Help needed to walk in hospital room?: None Help needed climbing 3-5 steps with a railing? : A Little 6 Click Score: 23    End of Session   Activity Tolerance: Patient tolerated treatment well Patient left: in chair;with call bell/phone within reach Nurse Communication: Mobility status PT Visit Diagnosis: Pain Pain - part of body:  (abdomen)    Time: 9892-1194 PT Time Calculation (min) (ACUTE ONLY): 24 min   Charges:   PT Evaluation $PT Eval Moderate Complexity: 1 Mod PT Treatments $Therapeutic Activity: 8-22 mins        Wyona Almas, PT, DPT Acute Rehabilitation Services Pager [phone] Office [phone]   Deno Etienne 10/14/2020, 8:51 AM

## 2020-10-14 NOTE — H&P (Addendum)
History and Physical  ARLINDA BARCELONA QBH:[id number] DOB: May 27, 1956 DOA: 10/13/2020  Referring physician:  Kennith Maes, PA, EDP PCP: Wendi Maya, NP  Outpatient Specialists: None Patient coming from: Home.  Chief Complaint: Abdominal pain and near syncope.  HPI: CHARLEEN MADERA is a 65 y.o. female with medical history significant for type 2 diabetes, essential hypertension, brady-tacky syndrome s/p pacemaker placement, OSA on CPAP, Sjogren's disease, chronic anxiety/depression, breast cancer status post resection who presented to [facility] ED from home via EMS due to abdominal pain and near syncope while eating at a restaurant on the evening of the day of presentation.  Associated with bilateral lower extremity weakness and the urge to defecate.  Denies a fall.  She was in her usual state of health prior to this.  She presented to the ED for further evaluation.  Work-up revealed sigmoid colonic diverticulitis.  Last colonoscopy was on 08/14/2020 revealing nonbleeding internal hemorrhoids, diverticulosis in the sigmoid colon, a 5 mm polyp that was removed.  In the ED, she was started on IV fluid, per sepsis protocol, and empiric IV antibiotics, Rocephin and IV Flagyl.  EDP requested admission for further management.  ED Course: BP 100/58, heart rate 59, respiration rate 16, O2 saturation 95% on room air.  Lab studies remarkable for uptrending lactic acidosis 5.2 from 3.3, serum sodium 131, serum potassium 3.0, serum bicarb 17, anion gap 17, BUN 23, creatinine 1.44 with baseline of 0.8, hemoglobin 9.2, MCV 78.  Twelve-lead EKG sinus rhythm rate of 61, no specific ST-T changes, QTC 619.  Review of Systems: Review of systems as noted in the HPI. All other systems reviewed and are negative.   Past Medical History:  Diagnosis Date  . Anemia    takes iron supplement  . Anxiety   . Arthritis   . Asthma    prn neb.  . Cancer [facility])    right breast cancer  . Depression   . Diabetes  mellitus    IDDM  . Environmental allergies   . GERD (gastroesophageal reflux disease)   . Headache(784.0)    migraines  . History of MRSA infection 08/2012   right breast  . Hypertension    under control with med., has been on med. x 10-15 yr.  . Overactive bladder   . PONV (postoperative nausea and vomiting)    used scop patch 7/14-please use again per pt request  . Sjogren's disease ([place])   . Sleep apnea    uses CPAP nightly   Past Surgical History:  Procedure Laterality Date  . ANKLE ARTHROSCOPY Left   . BIOPSY  05/15/2020   Procedure: BIOPSY;  Surgeon: Eloise Harman, DO;  [facility];  Service: Endoscopy;;  gastric   . BREAST RECONSTRUCTION  05/24/2012   Procedure: BREAST RECONSTRUCTION;  Surgeon: Cristine Polio, MD;  Location: [place];  Service: Plastics;  Laterality: Right;  NO FLEX HD  . BREAST RECONSTRUCTION Right 01/17/2013   Procedure: RIGHT BREAST RECONSTRUCTION WITH PLACEMENT OF TISSUE EXPANDER ;  Surgeon: Cristine Polio, MD;  Location: [place];  Service: Plastics;  Laterality: Right;  . BREAST REDUCTION SURGERY Left 02/28/2013   Procedure: LEFT BREAST REDUCTION  (BREAST);  Surgeon: Cristine Polio, MD;  Location: [place];  Service: Plastics;  Laterality: Left;  . CESAREAN SECTION    . COLONOSCOPY WITH PROPOFOL N/A 08/14/2020   Procedure: COLONOSCOPY WITH PROPOFOL;  Surgeon: Eloise Harman, DO;  [facility];  Service:  Endoscopy;  Laterality: N/A;  8:30am  . ESOPHAGOGASTRODUODENOSCOPY (EGD) WITH PROPOFOL N/A 05/15/2020   Procedure: ESOPHAGOGASTRODUODENOSCOPY (EGD) WITH PROPOFOL;  Surgeon: Eloise Harman, DO;  [facility];  Service: Endoscopy;  Laterality: N/A;  3:00pm  . INSERT / REPLACE / REMOVE PACEMAKER    . MASTECTOMY Right 03/2012  . POLYPECTOMY  05/15/2020   Procedure: POLYPECTOMY;  Surgeon: Eloise Harman, DO;  [facility];  Service: Endoscopy;;   antral  . POLYPECTOMY  08/14/2020   Procedure: POLYPECTOMY;  Surgeon: Eloise Harman, DO;  [facility];  Service: Endoscopy;;  . REMOVAL OF TISSUE EXPANDER AND PLACEMENT OF IMPLANT Right 05/10/2013   Procedure: RIGHT REMOVAL OF TISSUE EXPANDER ;  Surgeon: Cristine Polio, MD;  Location: [place];  Service: Plastics;  Laterality: Right;  . SHOULDER ARTHROSCOPY Right   . TISSUE EXPANDER PLACEMENT  05/24/2012   Procedure: TISSUE EXPANDER;  Surgeon: Cristine Polio, MD;  Location: [place];  Service: Plastics;  Laterality: Right;    Social History:  reports that she has never smoked. She has never used smokeless tobacco. She reports that she does not drink alcohol and does not use drugs.   Allergies  Allergen Reactions  . Soap Other (See Comments)    FRAGRANCES CAUSE MIGRAINES  . Vraylar [Cariprazine]     Hallucinations   . Latex Itching  . Sulfa Antibiotics Rash    Family History  Problem Relation Age of Onset  . Colon cancer Paternal Grandmother   . Colon cancer Paternal Grandfather       Prior to Admission medications   Medication Sig Start Date End Date Taking? Authorizing Provider  allopurinol (ZYLOPRIM) 100 MG tablet Take 100 mg by mouth daily.    [provider]  benazepril-hydrochlorthiazide (LOTENSIN HCT) 20-25 MG tablet Take 1 tablet by mouth daily.    [provider]  benztropine (COGENTIN) 0.5 MG tablet Take 0.5 mg by mouth 2 (two) times daily.    [provider]  clonazePAM (KLONOPIN) 0.5 MG tablet Take 0.5 mg by mouth at bedtime as needed for anxiety (sleep).    [provider]  colchicine 0.6 MG tablet Take 0.6 mg by mouth daily as needed (gout flare).    [provider]  DULoxetine (CYMBALTA) 60 MG capsule Take 60 mg by mouth 2 (two) times daily.    [provider]  furosemide (LASIX) 20 MG tablet Take 20 mg by mouth daily. 07/13/20   [provider]   hydroxychloroquine (PLAQUENIL) 200 MG tablet Take 400 mg by mouth daily.    [provider]  lamoTRIgine (LAMICTAL) 25 MG tablet Take 25 mg by mouth See admin instructions. Take 25 mg at bedtime for 2 weeks then 25 mg twice daily 08/01/20   [provider]  lidocaine (LIDODERM) 5 % Place 1 patch onto the skin daily as needed for pain. 06/16/20   [provider]  metFORMIN (GLUCOPHAGE) 1000 MG tablet Take 1,000 mg by mouth 2 (two) times daily with a meal.    [provider]  methadone (DOLOPHINE) 5 MG tablet Take 5 mg by mouth 2 (two) times daily.    [provider]  modafinil (PROVIGIL) 200 MG tablet Take 200 mg by mouth daily.    [provider]  omeprazole (PRILOSEC) 40 MG capsule Take 1 capsule (40 mg total) by mouth in the morning and at bedtime. 06/27/20   Carlis Stable, NP  oxybutynin (DITROPAN) 5  MG tablet Take 5 mg by mouth 3 (three) times daily.    [provider]  prednisoLONE acetate (PRED FORTE) 1 % ophthalmic suspension Place 1 drop into both eyes daily as needed (dry eyes). 07/05/20   [provider]  pregabalin (LYRICA) 100 MG capsule Take 100 mg by mouth 2 (two) times daily.    [provider]  propranolol (INDERAL) 10 MG tablet Take 10 mg by mouth 2 (two) times daily.    [provider]    Physical Exam: BP (!) 99/56   Pulse (!) 59   Temp 98 F (36.7 C) (Oral)   Resp 16   Ht 5\' 6"  (1.676 m)   Wt 121.6 kg   LMP 05/20/2012   SpO2 92%   BMI 43.26 kg/m   . General: 65 y.o. year-old female well developed well nourished in no acute distress.  Alert and oriented x3. . Cardiovascular: Regular rate and rhythm with no rubs or gallops.  No thyromegaly or JVD noted.  No lower extremity edema. 2/4 pulses in all 4 extremities. Marland Kitchen Respiratory: Clear to auscultation with no wheezes or rales. Good inspiratory effort. . Abdomen: Soft, tenderness with palpation left lower quadrant, nondistended with  normal bowel sounds x4 quadrants. . Muskuloskeletal: No cyanosis, clubbing or edema noted bilaterally . Neuro: CN II-XII intact, strength, sensation, reflexes . Skin: No ulcerative lesions noted or rashes. . Psychiatry: Judgement and insight appear normal. Mood is appropriate for condition and setting.          Labs on Admission:  Basic Metabolic Panel: Recent Labs  Lab 10/13/20 2041  NA 131*  K 3.0*  CL 97*  CO2 17*  GLUCOSE 181*  BUN 23  CREATININE 1.44*  CALCIUM 8.7*   Liver Function Tests: Recent Labs  Lab 10/13/20 2041  AST 18  ALT 11  ALKPHOS 87  BILITOT 0.5  PROT 6.2*  ALBUMIN 3.5   No results for input(s): LIPASE, AMYLASE in the last 168 hours. No results for input(s): AMMONIA in the last 168 hours. CBC: Recent Labs  Lab 10/13/20 2041  WBC 8.2  NEUTROABS 6.4  HGB 9.2*  HCT 29.5*  MCV 78.7*  PLT 226   Cardiac Enzymes: No results for input(s): CKTOTAL, CKMB, CKMBINDEX, TROPONINI in the last 168 hours.  BNP (last 3 results) No results for input(s): BNP in the last 8760 hours.  ProBNP (last 3 results) No results for input(s): PROBNP in the last 8760 hours.  CBG: No results for input(s): GLUCAP in the last 168 hours.  Radiological Exams on Admission: CT ABDOMEN PELVIS W CONTRAST  Result Date: 10/14/2020 CLINICAL DATA:  Weakness EXAM: CT ABDOMEN AND PELVIS WITH CONTRAST TECHNIQUE: Multidetector CT imaging of the abdomen and pelvis was performed using the standard protocol following bolus administration of intravenous contrast. CONTRAST:  17mL OMNIPAQUE IOHEXOL 300 MG/ML  SOLN COMPARISON:  None. FINDINGS: Lower chest: The visualized heart size within normal limits. No pericardial fluid/thickening. No hiatal hernia. The visualized portions of the lungs are clear. Hepatobiliary: The liver is normal in density without focal abnormality.The main portal vein is patent. No evidence of calcified gallstones, gallbladder wall thickening or biliary dilatation.  Pancreas: Unremarkable. No pancreatic ductal dilatation or surrounding inflammatory changes. Spleen: Normal in size without focal abnormality. Adrenals/Urinary Tract: Both adrenal glands appear normal. The kidneys and collecting system appear normal without evidence of urinary tract calculus or hydronephrosis. Bladder is unremarkable. Stomach/Bowel: The stomach and proximal small bowel are unremarkable. There is a mildly dilated  fluid and air-filled loop of distal ileum measuring up to 3.2 cm. No wall thickening or transition point. The terminal ileum is decompressed. There appears to be question of mild wall thickening with stranding changes seen within the proximal sigmoid colon with scattered diverticula. A moderate amount of colonic stool seen throughout. No loculated fluid collections or free air. Vascular/Lymphatic: There are no enlarged mesenteric, retroperitoneal, or pelvic lymph nodes. Scattered aortic atherosclerotic calcifications are seen without aneurysmal dilatation. Reproductive: No abnormality seen within the deep pelvis. Other: No evidence of abdominal wall mass or hernia. Musculoskeletal: No acute or significant osseous findings. IMPRESSION: Findings which may be suggestive of focal mild sigmoid colonic diverticulitis. No pericolonic fluid collections or free air. Mildly dilated focal segment of distal ileum which could be due to a ileus. Aortic Atherosclerosis (ICD10-I70.0). Electronically Signed   By: Prudencio Pair M.D.   On: 10/14/2020 01:35   DG Chest Portable 1 View  Result Date: 10/13/2020 CLINICAL DATA:  Near syncope EXAM: PORTABLE CHEST 1 VIEW COMPARISON:  None. FINDINGS: The heart size and mediastinal contours are within normal limits. A right-sided pacemaker seen with the lead tips in the right atrium right ventricle. Both lungs are clear. The visualized skeletal structures are unremarkable. IMPRESSION: No active disease. Electronically Signed   By: Prudencio Pair M.D.   On: 10/13/2020  21:15    EKG: I independently viewed the EKG done and my findings are as followed: Normal sinus rhythm 61, nonspecific ST-T changes, QTC 619.  Assessment/Plan Present on Admission: . Diverticulitis  Active Problems:   Diverticulitis  Acute mild sigmoid colonic diverticulitis, seen on CT scan No evidence of perforation or abscess. Presented with sudden onset left lower quadrant abdominal pain and near syncope Afebrile with no leukocytosis Lactic acidosis 3.3, uptrending to 5.2 Repeat lactic acid level, trend. Started on Rocephin and IV Flagyl in the ED, continue Follow blood cultures x2 peripherally. Clear liquid low-fat diet for now.  Near syncope, suspect multifactorial secondary to acute abdominal process versus others. Obtain orthostatic vital signs. Obtain UA. Continue IV fluid hydration PT OT to assess Fall precautions.  Lactic acidosis, suspect secondary to acute abdominal process and possibly Metformin use in the setting of AKI. Lactic acid 5.2 Obtain VBG to assess pH and CO2 level. Hold off Metformin Trend lactic acid  Anion gap metabolic acidosis likely multifactorial secondary to lactic acidosis and acute renal insufficiency. Presented with serum bicarb of 17 with anion gap of 17. Start IV fluid normal saline at 75 cc/h x1 day. Repeat BMP in the morning  AKI, likely prerenal in the setting of dehydration on diuretics. Baseline creatinine appears to be 0.8 with GFR greater than 60 Presented with creatinine of 1.4 with GFR of 41. Hold off home Lotensin, Metformin, Lasix, colchicine. Avoid nephrotoxic agents, dehydration and hypotension. Monitor urine output Repeat BMP in the morning  Prolonged QTC On presentation, twelve-lead EKG revealed QTC 619 Avoid QTC prolonging agents Optimize magnesium and potassium levels. Repeat twelve-lead EKG in the morning.  Hypokalemia Serum potassium 3.0 Repleted intravenously. Repeat level in the morning  Hypovolemic  hyponatremia Presented with serum sodium of 131 Start normal saline, gentle IV fluid hydration. Repeat BMP in the morning.  Chronic microcytic anemia Hemoglobin 9.2 with MCV of 78.   Last EGD done on 05/15/2020 revealed gastritis and one gastric polyp Not on iron supplement. No overt bleeding. Monitor H&H. Transfuse with hemoglobin less than 7.0.  Essential hypertension, BP is currently soft. Continue to hold off home oral antihypertensives.  Maintain MAP greater than 65. Continue to closely monitor vital signs.  Chronic anxiety/depression Stable. Resume home regimen She is on Klonopin and Cymbalta  Type 2 diabetes with hyperglycemia Obtain A1c Hold off home oral hypoglycemics. Start insulin sliding scale.  Gout No acute flare Resume home regimen allopurinol  Hold off colchicine for now due to renal insufficiency.  Polyneuropathy Resume home regimen She is on Lyrica  Chronic pain syndrome She is on methadone. Resume home regimen.  Sjogren's disease Resume home Plaquenil She states she also takes methotrexate, pharmacy to confirm.  OSA CPAP qhs    DVT prophylaxis: Subcu Lovenox daily  Code Status: Full code as stated by the patient herself.  Family Communication: None at bedside.  Disposition Plan: Admit to progressive unit.  Consults called: None.  Admission status: Inpatient status.   Status is: Inpatient    Dispo:  Patient From: Home  Planned Disposition: Home              Anticipated discharge date: 10/16/2020.  Medically stable for discharge: No, ongoing management of diverticulitis, lactic acidosis, electrolyte derangement.         Kayleen Memos MD [facility] Pager [phone]  If 7PM-7AM, please contact night-coverage [email or web address] Password [facility]  10/14/2020, 3:14 AM

## 2020-10-14 NOTE — Progress Notes (Signed)
Patient to room 01 from ED via stretcher.  Patient alert and oriented x 4.  Has some c/o pain in abdomen while MD was in the room assessing patient.  VSS.  # 20 G in  left AC with LR infusing @150cc /hr. Patient on Tele box # 1   Central tele called and given information on patient.  No distress noted from patient.  Taking ice chips w/o any n/v.

## 2020-10-15 ENCOUNTER — Encounter (HOSPITAL_COMMUNITY): Payer: Self-pay | Admitting: Internal Medicine

## 2020-10-15 DIAGNOSIS — E785 Hyperlipidemia, unspecified: Secondary | ICD-10-CM

## 2020-10-15 DIAGNOSIS — K5792 Diverticulitis of intestine, part unspecified, without perforation or abscess without bleeding: Secondary | ICD-10-CM | POA: Diagnosis not present

## 2020-10-15 DIAGNOSIS — E872 Acidosis: Secondary | ICD-10-CM | POA: Diagnosis not present

## 2020-10-15 DIAGNOSIS — E1169 Type 2 diabetes mellitus with other specified complication: Secondary | ICD-10-CM

## 2020-10-15 DIAGNOSIS — N179 Acute kidney failure, unspecified: Secondary | ICD-10-CM | POA: Diagnosis not present

## 2020-10-15 LAB — BASIC METABOLIC PANEL
Anion gap: 13 (ref 5–15)
BUN: 8 mg/dL (ref 8–23)
CO2: 21 mmol/L — ABNORMAL LOW (ref 22–32)
Calcium: 8.6 mg/dL — ABNORMAL LOW (ref 8.9–10.3)
Chloride: 102 mmol/L (ref 98–111)
Creatinine, Ser: 0.88 mg/dL (ref 0.44–1.00)
GFR, Estimated: >60 mL/min (ref >60)
Glucose, Bld: 113 mg/dL — ABNORMAL HIGH (ref 70–99)
Potassium: 3.7 mmol/L (ref 3.5–5.1)
Sodium: 136 mmol/L (ref 135–145)

## 2020-10-15 LAB — GLUCOSE, CAPILLARY
Glucose-Capillary: 111 mg/dL — ABNORMAL HIGH (ref 70–99)
Glucose-Capillary: 108 mg/dL — ABNORMAL HIGH (ref 70–99)
Glucose-Capillary: 95 mg/dL (ref 70–99)
Glucose-Capillary: 113 mg/dL — ABNORMAL HIGH (ref 70–99)

## 2020-10-15 MED ORDER — AMOXICILLIN-POT CLAVULANATE 875-125 MG PO TABS
1.0000 | ORAL_TABLET | Freq: Two times a day (BID) | ORAL | Status: DC
  Administered 2020-10-15 – 2020-10-16 (×3): 1 via ORAL
  Filled 2020-10-15 (×3): qty 1

## 2020-10-15 MED ORDER — METRONIDAZOLE 500 MG PO TABS
500.0000 mg | ORAL_TABLET | Freq: Three times a day (TID) | ORAL | Status: DC
  Administered 2020-10-15 – 2020-10-16 (×4): 500 mg via ORAL
  Filled 2020-10-15 (×4): qty 1

## 2020-10-15 MED ORDER — HYDROMORPHONE HCL 1 MG/ML IJ SOLN
2.0000 mg | INTRAMUSCULAR | Status: DC | PRN
  Administered 2020-10-15: 2 mg via INTRAVENOUS
  Filled 2020-10-15: qty 2

## 2020-10-15 MED ORDER — SIMETHICONE 80 MG PO CHEW
80.0000 mg | CHEWABLE_TABLET | Freq: Four times a day (QID) | ORAL | Status: DC | PRN
  Administered 2020-10-15: 80 mg via ORAL
  Filled 2020-10-15: qty 1

## 2020-10-15 NOTE — Progress Notes (Signed)
[facility] PROGRESS NOTE    Progress Note  Heidi [name]  QIH:[id number] DOB: 1956-02-06 DOA: 10/13/2020 PCP: Wendi Maya, NP     Brief Narrative:   Heidi [name] is an 65 y.o. 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, she Sjgren on Plaquenil, chronic pain syndrome on methadone the hospital for abdominal pain, with a last colonoscopy on 08/14/2020 that revealed diverticulosis, CT scan of the abdomen and pelvis on admission showed diverticulitis she was started empirically on Rocephin and Flagyl  Significant studies: 08/14/2019 CT scan with contrast of the abdomen pelvis showed sigmoid colonic diverticulitis Antibiotics: 08/13/2021 Rocephin and Flagyl  Microbiology data: 08/13/2021 blood culture:  Procedures: None  Assessment/Plan:   Severe sepsis due to acute Diverticulitis: Patient is tolerating her diet will advance her to regular diet. Change her antibiotics to Augmentin and Flagyl. Culture data has remained negative till date.  Near syncopal episode: In the setting of severe sepsis with hypovolemia. Resolved with IV fluids.  High anion gap metabolic acidosis: In the setting of sepsis and acute kidney injury. Lactic acid of 5 she was fluid resuscitated her lactic acidosis is resolved.  Acute kidney injury Likely hemodynamically mediated in the setting of sepsis and diuretic use. With a baseline creatinine of 0.8 on presentation 1.4 antihypertensive medications and diuretics were held. She is able to hydrate orally KVO IV fluids basic metabolic panel is pending this morning.  Prolonged QTC Likely due to electrolyte imbalance, try to keep potassium greater than 4 magnesium greater than 2. Repeated twelve-lead EKG in the morning.  Hypokalemia/hypomagnesemia: Replete potassium IV recheck in the  morning. Magnesium 1.6 will give a bolus of IV.  Hypovolemic hyponatremia Recently with IV fluids continue and recheck in the morning.  Iron deficiency anemia Noted  Chronic anxiety/depression Continue current home regimen of Klonopin and Cymbalta.  Diabetes mellitus type 2 With an A1c of 6.0 continue sliding scale insulin.  Gout Continue allopurinol no flares.  Chronic pain syndrome Continue methadone.  Morbid obesity: Noted with a BMI greater than 45  DVT prophylaxis: Lovenox Family Communication: None Status is: Inpatient  Remains inpatient appropriate because:Hemodynamically unstable   Dispo:  Patient From: Home  Planned Disposition: Home  Medically stable for discharge: No          Code Status:     Code Status Orders  (From admission, onward)         Start     Ordered   10/14/20 0408  Full code  Continuous        10/14/20 0407        Code Status History    This patient has a current code status but no historical code status.   Advance Care Planning Activity        IV Access:    Peripheral IV   Procedures and diagnostic studies:   CT ABDOMEN PELVIS W CONTRAST  Result Date: 10/14/2020 CLINICAL DATA:  Weakness EXAM: CT ABDOMEN AND PELVIS WITH CONTRAST TECHNIQUE: Multidetector CT imaging of the abdomen and pelvis was performed using the standard protocol following bolus administration of intravenous contrast. CONTRAST:  58mL OMNIPAQUE IOHEXOL 300 MG/ML  SOLN COMPARISON:  None. FINDINGS: Lower chest: The visualized heart size within normal limits. No pericardial fluid/thickening. No hiatal hernia. The visualized portions of the lungs are clear. Hepatobiliary: The liver is normal in density without focal abnormality.The main portal vein is  patent. No evidence of calcified gallstones, gallbladder wall thickening or biliary dilatation. Pancreas: Unremarkable. No pancreatic ductal dilatation or surrounding inflammatory changes. Spleen: Normal in  size without focal abnormality. Adrenals/Urinary Tract: Both adrenal glands appear normal. The kidneys and collecting system appear normal without evidence of urinary tract calculus or hydronephrosis. Bladder is unremarkable. Stomach/Bowel: The stomach and proximal small bowel are unremarkable. There is a mildly dilated fluid and air-filled loop of distal ileum measuring up to 3.2 cm. No wall thickening or transition point. The terminal ileum is decompressed. There appears to be question of mild wall thickening with stranding changes seen within the proximal sigmoid colon with scattered diverticula. A moderate amount of colonic stool seen throughout. No loculated fluid collections or free air. Vascular/Lymphatic: There are no enlarged mesenteric, retroperitoneal, or pelvic lymph nodes. Scattered aortic atherosclerotic calcifications are seen without aneurysmal dilatation. Reproductive: No abnormality seen within the deep pelvis. Other: No evidence of abdominal wall mass or hernia. Musculoskeletal: No acute or significant osseous findings. IMPRESSION: Findings which may be suggestive of focal mild sigmoid colonic diverticulitis. No pericolonic fluid collections or free air. Mildly dilated focal segment of distal ileum which could be due to a ileus. Aortic Atherosclerosis (ICD10-I70.0). Electronically Signed   By: Prudencio Pair M.D.   On: 10/14/2020 01:35   DG Chest Portable 1 View  Result Date: 10/13/2020 CLINICAL DATA:  Near syncope EXAM: PORTABLE CHEST 1 VIEW COMPARISON:  None. FINDINGS: The heart size and mediastinal contours are within normal limits. A right-sided pacemaker seen with the lead tips in the right atrium right ventricle. Both lungs are clear. The visualized skeletal structures are unremarkable. IMPRESSION: No active disease. Electronically Signed   By: Prudencio Pair M.D.   On: 10/13/2020 21:15     Medical Consultants:    None.   Subjective:    Heidi [name] no  complaints.  Objective:    Vitals:   10/15/20 0024 10/15/20 0306 10/15/20 0607 10/15/20 0834  BP:  108/64  (!) 105/52  Pulse:  79  60  Resp: 20 18  18   Temp:  98.7 F (37.1 C)  98.5 F (36.9 C)  TempSrc:  Oral  Oral  SpO2:    95%  Weight:   122.3 kg   Height:       SpO2: 95 %   Intake/Output Summary (Last 24 hours) at 10/15/2020 0958 Last data filed at 10/14/2020 1635 Gross per 24 hour  Intake 931.77 ml  Output --  Net 931.77 ml   Filed Weights   10/13/20 2029 10/14/20 0420 10/15/20 0607  Weight: 121.6 kg 124.3 kg 122.3 kg    Exam: General exam: In no acute distress. Respiratory system: Good air movement and clear to auscultation. Cardiovascular system: S1 & S2 heard, RRR. No JVD. Gastrointestinal system: Abdomen is nondistended, soft and nontender.  Extremities: No pedal edema. Skin: No rashes, lesions or ulcers    Labs: Basic Metabolic Panel: Recent Labs  Lab 10/13/20 2041 10/14/20 0533  NA 131* 134*  K 3.0* 3.5  CL 97* 101  CO2 17* 22  GLUCOSE 181* 122*  BUN 23 22  CREATININE 1.44* 1.19*  CALCIUM 8.7* 8.4*  MG  --  1.6*   GFR Estimated Creatinine Clearance: 64.5 mL/min (A) (by C-G formula based on SCr of 1.19 mg/dL (H)). Liver Function Tests: Recent Labs  Lab 10/13/20 2041 10/14/20 0533  AST 18 16  ALT 11 11  ALKPHOS 87 67  BILITOT 0.5 0.7  PROT 6.2* 5.1*  ALBUMIN 3.5 3.0*   No results for input(s): LIPASE, AMYLASE in the last 168 hours. No results for input(s): AMMONIA in the last 168 hours. Coagulation profile No results for input(s): INR, PROTIME in the last 168 hours. COVID-19 Labs  No results for input(s): DDIMER, FERRITIN, LDH, CRP in the last 72 hours.  Lab Results  Component Value Date   SARSCOV2NAA NEGATIVE 10/13/2020   SARSCOV2NAA NEGATIVE 08/09/2020   [place] NEGATIVE 05/14/2020    CBC: Recent Labs  Lab 10/13/20 2041 10/14/20 0533  WBC 8.2 5.3  NEUTROABS 6.4 3.3  HGB 9.2* 8.8*  HCT 29.5* 28.1*  MCV 78.7*  76.4*  PLT 226 183   Cardiac Enzymes: No results for input(s): CKTOTAL, CKMB, CKMBINDEX, TROPONINI in the last 168 hours. BNP (last 3 results) No results for input(s): PROBNP in the last 8760 hours. CBG: Recent Labs  Lab 10/14/20 0651 10/14/20 1230 10/14/20 1643 10/14/20 2119 10/15/20 0606  GLUCAP 94 136* 184* 110* 111*   D-Dimer: No results for input(s): DDIMER in the last 72 hours. Hgb A1c: Recent Labs    10/14/20 0533  HGBA1C 6.0*   Lipid Profile: No results for input(s): CHOL, HDL, LDLCALC, TRIG, CHOLHDL, LDLDIRECT in the last 72 hours. Thyroid function studies: No results for input(s): TSH, T4TOTAL, T3FREE, THYROIDAB in the last 72 hours.  Invalid input(s): FREET3 Anemia work up: No results for input(s): VITAMINB12, FOLATE, FERRITIN, TIBC, IRON, RETICCTPCT in the last 72 hours. Sepsis Labs: Recent Labs  Lab 10/13/20 0143 10/13/20 2041 10/13/20 2145 10/14/20 0533 10/14/20 0722  WBC  --  8.2  --  5.3  --   LATICACIDVEN 3.3*  --  5.2* 2.6* 2.0*   Microbiology Recent Results (from the past 240 hour(s))  Resp Panel by RT-PCR (Flu A&B, Covid) Nasopharyngeal Swab     Status: None   Collection Time: 10/13/20  9:06 PM   Specimen: Nasopharyngeal Swab; Nasopharyngeal(NP) swabs in vial transport medium  Result Value Ref Range Status   SARS Coronavirus 2 by RT PCR NEGATIVE NEGATIVE Final    Comment: (NOTE) SARS-CoV-2 target nucleic acids are NOT DETECTED.  The SARS-CoV-2 RNA is generally detectable in upper respiratory specimens during the acute phase of infection. The lowest concentration of SARS-CoV-2 viral copies this assay can detect is 138 copies/mL. A negative result does not preclude SARS-Cov-2 infection and should not be used as the sole basis for treatment or other patient management decisions. A negative result may occur with  improper specimen collection/handling, submission of specimen other than nasopharyngeal swab, presence of viral mutation(s)  within the areas targeted by this assay, and inadequate number of viral copies(<138 copies/mL). A negative result must be combined with clinical observations, patient history, and epidemiological information. The expected result is Negative.  Fact Sheet for Patients:  EntrepreneurPulse.com.au  Fact Sheet for Healthcare Providers:  IncredibleEmployment.be  This test is no t yet approved or cleared by the [place] FDA and  has been authorized for detection and/or diagnosis of SARS-CoV-2 by FDA under an Emergency Use Authorization (EUA). This EUA will remain  in effect (meaning this test can be used) for the duration of the COVID-19 declaration under Section 564(b)(1) of the Act, 21 U.S.C.section 360bbb-3(b)(1), unless the authorization is terminated  or revoked sooner.       Influenza A by PCR NEGATIVE NEGATIVE Final   Influenza B by PCR NEGATIVE NEGATIVE Final    Comment: (NOTE) The Xpert Xpress SARS-CoV-2/FLU/RSV plus assay is intended as an aid in the diagnosis of  influenza from Nasopharyngeal swab specimens and should not be used as a sole basis for treatment. Nasal washings and aspirates are unacceptable for Xpert Xpress SARS-CoV-2/FLU/RSV testing.  Fact Sheet for Patients: EntrepreneurPulse.com.au  Fact Sheet for Healthcare Providers: IncredibleEmployment.be  This test is not yet approved or cleared by the [place] FDA and has been authorized for detection and/or diagnosis of SARS-CoV-2 by FDA under an Emergency Use Authorization (EUA). This EUA will remain in effect (meaning this test can be used) for the duration of the COVID-19 declaration under Section 564(b)(1) of the Act, 21 U.S.C. section 360bbb-3(b)(1), unless the authorization is terminated or revoked.  Performed at [facility], [place] [address], [place], [place] [address]      Medications:   . allopurinol  100 mg  Oral Daily  . benztropine  0.5 mg Oral BID  . DULoxetine  60 mg Oral BID  . enoxaparin (LOVENOX) injection  40 mg Subcutaneous Daily  . hydroxychloroquine  400 mg Oral Daily  . insulin aspart  0-15 Units Subcutaneous TID WC  . insulin aspart  0-5 Units Subcutaneous QHS  . lamoTRIgine  25 mg Oral BID  . methadone  5 mg Oral BID  . modafinil  200 mg Oral Q breakfast  . oxybutynin  5 mg Oral TID  . pantoprazole  40 mg Oral Daily  . pregabalin  100 mg Oral BID   Continuous Infusions: . cefTRIAXone (ROCEPHIN)  IV 2 g (10/15/20 0034)  . metronidazole 500 mg (10/15/20 0503)      LOS: 1 day   Charlynne Cousins  [facility]  10/15/2020, 9:58 AM

## 2020-10-15 NOTE — Progress Notes (Signed)
Mobility Specialist: Progress Note   10/15/20 1500  Mobility  Activity Ambulated in hall  Level of Assistance Modified independent, requires aide device or extra time  Assistive Device Front wheel walker  Distance Ambulated (ft) 380 ft  Mobility Response Tolerated well  Mobility performed by Mobility specialist  Bed Position Chair  $Mobility charge 1 Mobility   Post-Mobility: 88 HR, 98% SpO2  Pt with DOE needing to take a couple brief standing breaks. Pt otherwise asx and to chair after walk.   [facility] Marilu Rylander Mobility Specialist Mobility Specialist Phone: [phone]

## 2020-10-16 ENCOUNTER — Other Ambulatory Visit: Payer: Self-pay | Admitting: Internal Medicine

## 2020-10-16 DIAGNOSIS — E872 Acidosis: Secondary | ICD-10-CM | POA: Diagnosis not present

## 2020-10-16 DIAGNOSIS — R55 Syncope and collapse: Secondary | ICD-10-CM

## 2020-10-16 DIAGNOSIS — K5792 Diverticulitis of intestine, part unspecified, without perforation or abscess without bleeding: Secondary | ICD-10-CM | POA: Diagnosis not present

## 2020-10-16 DIAGNOSIS — N179 Acute kidney failure, unspecified: Secondary | ICD-10-CM | POA: Diagnosis not present

## 2020-10-16 LAB — GLUCOSE, CAPILLARY: Glucose-Capillary: 114 mg/dL — ABNORMAL HIGH (ref 70–99)

## 2020-10-16 MED ORDER — FUROSEMIDE 20 MG PO TABS
20.0000 mg | ORAL_TABLET | Freq: Every day | ORAL | Status: DC
  Administered 2020-10-16: 20 mg via ORAL
  Filled 2020-10-16: qty 1

## 2020-10-16 MED ORDER — AMOXICILLIN-POT CLAVULANATE 875-125 MG PO TABS: 1.0000 | ORAL_TABLET | Freq: Two times a day (BID) | ORAL | 0 refills | Status: DC

## 2020-10-16 MED ORDER — METRONIDAZOLE 500 MG PO TABS: 500.0000 mg | ORAL_TABLET | Freq: Three times a day (TID) | ORAL | 0 refills | Status: DC

## 2020-10-16 MED ORDER — POLYETHYLENE GLYCOL 3350 17 G PO PACK
17.0000 g | PACK | Freq: Three times a day (TID) | ORAL | Status: DC
  Administered 2020-10-16: 17 g via ORAL
  Filled 2020-10-16: qty 1

## 2020-10-16 MED ORDER — AMOXICILLIN-POT CLAVULANATE 875-125 MG PO TABS
1.0000 | ORAL_TABLET | Freq: Two times a day (BID) | ORAL | 0 refills | Status: AC
Start: 2020-10-16 — End: 2020-10-26

## 2020-10-16 MED FILL — AMOX-CLAV 875-125 MG TABLET: 875-125 | 10 days supply | Qty: 20 | Fill #0

## 2020-10-16 NOTE — Discharge Summary (Addendum)
Physician Discharge Summary  Heidi [name] TDS:[id number] DOB: 05-May-1956 DOA: 10/13/2020  PCP: Wendi Maya, NP  Admit date: 10/13/2020 Discharge date: 10/16/2020  Admitted From: home Disposition:  Home  Recommendations for Outpatient Follow-up:  1. Follow up with PCP in 1-2 weeks 2. Please obtain BMP/CBC in one week 3.   Home Health:No Equipment/Devices:None  Discharge Condition:Stable CODE STATUS:Full Diet recommendation: Heart Healthy    Brief/Interim Summary: 65 y.o. 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, she Sjgren on Plaquenil, chronic pain syndrome on methadone the hospital for abdominal pain, with a last colonoscopy on 08/14/2020 that revealed diverticulosis, CT scan of the abdomen and pelvis on admission showed diverticulitis she was started empirically on Rocephin and Flagyl  Discharge Diagnoses:  Active Problems:   Iron deficiency anemia due to chronic blood loss   Diverticulitis   SIRS   Near syncope   High anion gap metabolic acidosis   AKI (acute kidney injury) ([place])   Hypomagnesemia   Type 2 diabetes mellitus with hyperlipidemia ([place]) SIRS due to acute diverticulitis: She was fluid resuscitated started empirically on IV Rocephin and Flagyl. CT scan of the abdomen and pelvis showed a sigmoid small diverticulitis with no gas abscess or perforation. Culture data remain negative till date. She was tolerating her diet after several days she was changed to oral Augmentin which should continue as an outpatient for a total of 10 days.  Near syncopal episode: In the setting of hypovolemia resolved with IV fluids.  High anion gap metabolic acidosis: In setting of acute kidney injury resolved with IV fluid resuscitation.  Acute kidney injury: Likely hemodynamically mediated in the setting of  diuretic  use, with a baseline creatinine of 0.9 admission 1.4 antihypertensive medications were held she was started on IV fluids her creatinine returned to baseline.  Prolonged QTC: Likely due to electrolyte imbalance they were repleted her QTC improved. Her potassium was Above 4 magnesium above 2.  Hypokalemia/hypomagnesemia: They were repleted now resolved.  Hypovolemic hyponatremia: Resolved with IV fluid hydration.  Essential hypertension: Antihypertensive medications were held on admission once her blood pressure improved she was restarted back on her home regimen she will resume this as an outpatient. No changes made to her medication.  Iron deficiency anemia: Noted continue iron therapy as an outpatient.  Chronic anxiety/depression: Continue Klonopin and Cymbalta.  Diabetes mellitus type 2: With an A1c of 6.0 at home she was on Metformin this was held she was covered with sliding scale insulin.  Chronic pain syndrome: Continue current home regimen.  No changes made.   Discharge Instructions  Discharge Instructions    Diet - low sodium heart healthy   Complete by: As directed    Increase activity slowly   Complete by: As directed      Allergies as of 10/16/2020      Reactions   Soap Other (See Comments)   FRAGRANCES CAUSE MIGRAINES   Vraylar [cariprazine]    Hallucinations    Latex Itching   Sulfa Antibiotics Rash      Medication List    TAKE these medications   allopurinol 100 MG tablet Commonly known as: ZYLOPRIM Take 100 mg by mouth daily.   amoxicillin-clavulanate 875-125 MG tablet Commonly known as: AUGMENTIN Take 1 tablet by mouth every 12 (twelve) hours.   benazepril-hydrochlorthiazide 20-25 MG tablet Commonly known as: LOTENSIN HCT Take 1 tablet by mouth daily.  benztropine 0.5 MG tablet Commonly known as: COGENTIN Take 0.5 mg by mouth 2 (two) times daily.   clonazePAM 0.5 MG tablet Commonly known as: KLONOPIN Take 0.5 mg by mouth at bedtime  as needed for anxiety (sleep).   colchicine 0.6 MG tablet Take 0.6 mg by mouth daily as needed (gout flare).   DULoxetine 60 MG capsule Commonly known as: CYMBALTA Take 60 mg by mouth 2 (two) times daily.   folic acid 1 MG tablet Commonly known as: FOLVITE Take 1 mg by mouth daily.   furosemide 20 MG tablet Commonly known as: LASIX Take 20 mg by mouth daily.   hydroxychloroquine 200 MG tablet Commonly known as: PLAQUENIL Take 400 mg by mouth daily.   lamoTRIgine 25 MG tablet Commonly known as: LAMICTAL Take 25 mg by mouth 2 (two) times daily.   lidocaine 5 % Commonly known as: LIDODERM Place 1 patch onto the skin daily as needed for pain.   metFORMIN 1000 MG tablet Commonly known as: GLUCOPHAGE Take 1,000 mg by mouth 2 (two) times daily with a meal.   methadone 5 MG tablet Commonly known as: DOLOPHINE Take 5 mg by mouth 2 (two) times daily.   methotrexate 2.5 MG tablet Take 4 tablets by mouth every Friday.   metroNIDAZOLE 500 MG tablet Commonly known as: FLAGYL Take 1 tablet (500 mg total) by mouth every 8 (eight) hours for 10 days.   modafinil 200 MG tablet Commonly known as: PROVIGIL Take 200 mg by mouth daily.   nystatin powder Generic drug: nystatin Apply 2-3 application topically daily.   omeprazole 40 MG capsule Commonly known as: PRILOSEC Take 1 capsule (40 mg total) by mouth in the morning and at bedtime.   oxybutynin 5 MG tablet Commonly known as: DITROPAN Take 5 mg by mouth 3 (three) times daily.   prednisoLONE acetate 1 % ophthalmic suspension Commonly known as: PRED FORTE Place 1 drop into both eyes daily as needed (dry eyes).   pregabalin 100 MG capsule Commonly known as: LYRICA Take 100 mg by mouth 2 (two) times daily.   propranolol 10 MG tablet Commonly known as: INDERAL Take 10 mg by mouth 2 (two) times daily.       Allergies  Allergen Reactions  . Soap Other (See Comments)    FRAGRANCES CAUSE MIGRAINES  . Vraylar  [Cariprazine]     Hallucinations   . Latex Itching  . Sulfa Antibiotics Rash    Consultations:  None   Procedures/Studies: CT ABDOMEN PELVIS W CONTRAST  Result Date: 10/14/2020 CLINICAL DATA:  Weakness EXAM: CT ABDOMEN AND PELVIS WITH CONTRAST TECHNIQUE: Multidetector CT imaging of the abdomen and pelvis was performed using the standard protocol following bolus administration of intravenous contrast. CONTRAST:  50mL OMNIPAQUE IOHEXOL 300 MG/ML  SOLN COMPARISON:  None. FINDINGS: Lower chest: The visualized heart size within normal limits. No pericardial fluid/thickening. No hiatal hernia. The visualized portions of the lungs are clear. Hepatobiliary: The liver is normal in density without focal abnormality.The main portal vein is patent. No evidence of calcified gallstones, gallbladder wall thickening or biliary dilatation. Pancreas: Unremarkable. No pancreatic ductal dilatation or surrounding inflammatory changes. Spleen: Normal in size without focal abnormality. Adrenals/Urinary Tract: Both adrenal glands appear normal. The kidneys and collecting system appear normal without evidence of urinary tract calculus or hydronephrosis. Bladder is unremarkable. Stomach/Bowel: The stomach and proximal small bowel are unremarkable. There is a mildly dilated fluid and air-filled loop of distal ileum measuring up to 3.2 cm. No wall thickening  or transition point. The terminal ileum is decompressed. There appears to be question of mild wall thickening with stranding changes seen within the proximal sigmoid colon with scattered diverticula. A moderate amount of colonic stool seen throughout. No loculated fluid collections or free air. Vascular/Lymphatic: There are no enlarged mesenteric, retroperitoneal, or pelvic lymph nodes. Scattered aortic atherosclerotic calcifications are seen without aneurysmal dilatation. Reproductive: No abnormality seen within the deep pelvis. Other: No evidence of abdominal wall mass or  hernia. Musculoskeletal: No acute or significant osseous findings. IMPRESSION: Findings which may be suggestive of focal mild sigmoid colonic diverticulitis. No pericolonic fluid collections or free air. Mildly dilated focal segment of distal ileum which could be due to a ileus. Aortic Atherosclerosis (ICD10-I70.0). Electronically Signed   By: Prudencio Pair M.D.   On: 10/14/2020 01:35   DG Chest Portable 1 View  Result Date: 10/13/2020 CLINICAL DATA:  Near syncope EXAM: PORTABLE CHEST 1 VIEW COMPARISON:  None. FINDINGS: The heart size and mediastinal contours are within normal limits. A right-sided pacemaker seen with the lead tips in the right atrium right ventricle. Both lungs are clear. The visualized skeletal structures are unremarkable. IMPRESSION: No active disease. Electronically Signed   By: Prudencio Pair M.D.   On: 10/13/2020 21:15    (Echo, Carotid, EGD, Colonoscopy, ERCP)    Subjective: No new complains  Discharge Exam: Vitals:   10/15/20 2254 10/16/20 0311  BP: (!) 120/59 117/64  Pulse: 77 79  Resp: 20 20  Temp: 98.4 F (36.9 C) 97.7 F (36.5 C)  SpO2: 96% 96%   Vitals:   10/15/20 1615 10/15/20 1923 10/15/20 2254 10/16/20 0311  BP: (!) 116/55 132/75 (!) 120/59 117/64  Pulse: 60 67 77 79  Resp: 17 20 20 20   Temp: 98.2 F (36.8 C) 98.4 F (36.9 C) 98.4 F (36.9 C) 97.7 F (36.5 C)  TempSrc: Oral Oral Oral Oral  SpO2: 97% 96% 96% 96%  Weight:    125.5 kg  Height:        General: Pt is alert, awake, not in acute distress Cardiovascular: RRR, S1/S2 +, no rubs, no gallops Respiratory: CTA bilaterally, no wheezing, no rhonchi Abdominal: Soft, NT, ND, bowel sounds + Extremities: no edema, no cyanosis    The results of significant diagnostics from this hospitalization (including imaging, microbiology, ancillary and laboratory) are listed below for reference.     Microbiology: Recent Results (from the past 240 hour(s))  Resp Panel by RT-PCR (Flu A&B, Covid)  Nasopharyngeal Swab     Status: None   Collection Time: 10/13/20  9:06 PM   Specimen: Nasopharyngeal Swab; Nasopharyngeal(NP) swabs in vial transport medium  Result Value Ref Range Status   SARS Coronavirus 2 by RT PCR NEGATIVE NEGATIVE Final    Comment: (NOTE) SARS-CoV-2 target nucleic acids are NOT DETECTED.  The SARS-CoV-2 RNA is generally detectable in upper respiratory specimens during the acute phase of infection. The lowest concentration of SARS-CoV-2 viral copies this assay can detect is 138 copies/mL. A negative result does not preclude SARS-Cov-2 infection and should not be used as the sole basis for treatment or other patient management decisions. A negative result may occur with  improper specimen collection/handling, submission of specimen other than nasopharyngeal swab, presence of viral mutation(s) within the areas targeted by this assay, and inadequate number of viral copies(<138 copies/mL). A negative result must be combined with clinical observations, patient history, and epidemiological information. The expected result is Negative.  Fact Sheet for Patients:  EntrepreneurPulse.com.au  Fact  Sheet for Healthcare Providers:  IncredibleEmployment.be  This test is no t yet approved or cleared by the [place] FDA and  has been authorized for detection and/or diagnosis of SARS-CoV-2 by FDA under an Emergency Use Authorization (EUA). This EUA will remain  in effect (meaning this test can be used) for the duration of the COVID-19 declaration under Section 564(b)(1) of the Act, 21 U.S.C.section 360bbb-3(b)(1), unless the authorization is terminated  or revoked sooner.       Influenza A by PCR NEGATIVE NEGATIVE Final   Influenza B by PCR NEGATIVE NEGATIVE Final    Comment: (NOTE) The Xpert Xpress SARS-CoV-2/FLU/RSV plus assay is intended as an aid in the diagnosis of influenza from Nasopharyngeal swab specimens and should not be  used as a sole basis for treatment. Nasal washings and aspirates are unacceptable for Xpert Xpress SARS-CoV-2/FLU/RSV testing.  Fact Sheet for Patients: EntrepreneurPulse.com.au  Fact Sheet for Healthcare Providers: IncredibleEmployment.be  This test is not yet approved or cleared by the [place] FDA and has been authorized for detection and/or diagnosis of SARS-CoV-2 by FDA under an Emergency Use Authorization (EUA). This EUA will remain in effect (meaning this test can be used) for the duration of the COVID-19 declaration under Section 564(b)(1) of the Act, 21 U.S.C. section 360bbb-3(b)(1), unless the authorization is terminated or revoked.  Performed at [facility], [place] [address] [place], [place] [address]   Blood culture (routine x 2)     Status: None (Preliminary result)   Collection Time: 10/14/20  3:00 AM   Specimen: BLOOD  Result Value Ref Range Status   Specimen Description BLOOD LEFT ANTECUBITAL  Final   Special Requests   Final    BOTTLES DRAWN AEROBIC AND ANAEROBIC Blood Culture adequate volume   Culture   Final    NO GROWTH 1 DAY Performed at [facility], [place] [address] [place], [place] [address]    Report Status PENDING  Incomplete  Blood culture (routine x 2)     Status: None (Preliminary result)   Collection Time: 10/14/20  3:11 AM   Specimen: BLOOD LEFT HAND  Result Value Ref Range Status   Specimen Description BLOOD LEFT HAND  Final   Special Requests   Final    BOTTLES DRAWN AEROBIC AND ANAEROBIC Blood Culture results may not be optimal due to an inadequate volume of blood received in culture bottles   Culture   Final    NO GROWTH 1 DAY Performed at [facility], [place] [address], [place], [place] [address]    Report Status PENDING  Incomplete     Labs: BNP (last 3 results) No results for input(s): BNP in the last 8760 hours. Basic Metabolic Panel: Recent Labs  Lab 10/13/20 2041  10/14/20 0533 10/15/20 1019  NA 131* 134* 136  K 3.0* 3.5 3.7  CL 97* 101 102  CO2 17* 22 21*  GLUCOSE 181* 122* 113*  BUN 23 22 8   CREATININE 1.44* 1.19* 0.88  CALCIUM 8.7* 8.4* 8.6*  MG  --  1.6*  --    Liver Function Tests: Recent Labs  Lab 10/13/20 2041 10/14/20 0533  AST 18 16  ALT 11 11  ALKPHOS 87 67  BILITOT 0.5 0.7  PROT 6.2* 5.1*  ALBUMIN 3.5 3.0*   No results for input(s): LIPASE, AMYLASE in the last 168 hours. No results for input(s): AMMONIA in the last 168 hours. CBC: Recent Labs  Lab 10/13/20 2041 10/14/20 0533  WBC 8.2 5.3  NEUTROABS 6.4 3.3  HGB 9.2* 8.8*  HCT 29.5* 28.1*  MCV 78.7* 76.4*  PLT 226 183   Cardiac Enzymes: No results for input(s): CKTOTAL, CKMB, CKMBINDEX, TROPONINI in the last 168 hours. BNP: Invalid input(s): POCBNP CBG: Recent Labs  Lab 10/15/20 0606 10/15/20 1122 10/15/20 1648 10/15/20 2153 10/16/20 0605  GLUCAP 111* 108* 95 113* 114*   D-Dimer No results for input(s): DDIMER in the last 72 hours. Hgb A1c Recent Labs    10/14/20 0533  HGBA1C 6.0*   Lipid Profile No results for input(s): CHOL, HDL, LDLCALC, TRIG, CHOLHDL, LDLDIRECT in the last 72 hours. Thyroid function studies No results for input(s): TSH, T4TOTAL, T3FREE, THYROIDAB in the last 72 hours.  Invalid input(s): FREET3 Anemia work up No results for input(s): VITAMINB12, FOLATE, FERRITIN, TIBC, IRON, RETICCTPCT in the last 72 hours. Urinalysis No results found for: COLORURINE, APPEARANCEUR, [place], [place], GLUCOSEU, [place], [place], [place], PROTEINUR, UROBILINOGEN, NITRITE, LEUKOCYTESUR Sepsis Labs Invalid input(s): PROCALCITONIN,  WBC,  LACTICIDVEN Microbiology Recent Results (from the past 240 hour(s))  Resp Panel by RT-PCR (Flu A&B, Covid) Nasopharyngeal Swab     Status: None   Collection Time: 10/13/20  9:06 PM   Specimen: Nasopharyngeal Swab; Nasopharyngeal(NP) swabs in vial transport medium  Result Value Ref Range Status   SARS  Coronavirus 2 by RT PCR NEGATIVE NEGATIVE Final    Comment: (NOTE) SARS-CoV-2 target nucleic acids are NOT DETECTED.  The SARS-CoV-2 RNA is generally detectable in upper respiratory specimens during the acute phase of infection. The lowest concentration of SARS-CoV-2 viral copies this assay can detect is 138 copies/mL. A negative result does not preclude SARS-Cov-2 infection and should not be used as the sole basis for treatment or other patient management decisions. A negative result may occur with  improper specimen collection/handling, submission of specimen other than nasopharyngeal swab, presence of viral mutation(s) within the areas targeted by this assay, and inadequate number of viral copies(<138 copies/mL). A negative result must be combined with clinical observations, patient history, and epidemiological information. The expected result is Negative.  Fact Sheet for Patients:  EntrepreneurPulse.com.au  Fact Sheet for Healthcare Providers:  IncredibleEmployment.be  This test is no t yet approved or cleared by the [place] FDA and  has been authorized for detection and/or diagnosis of SARS-CoV-2 by FDA under an Emergency Use Authorization (EUA). This EUA will remain  in effect (meaning this test can be used) for the duration of the COVID-19 declaration under Section 564(b)(1) of the Act, 21 U.S.C.section 360bbb-3(b)(1), unless the authorization is terminated  or revoked sooner.       Influenza A by PCR NEGATIVE NEGATIVE Final   Influenza B by PCR NEGATIVE NEGATIVE Final    Comment: (NOTE) The Xpert Xpress SARS-CoV-2/FLU/RSV plus assay is intended as an aid in the diagnosis of influenza from Nasopharyngeal swab specimens and should not be used as a sole basis for treatment. Nasal washings and aspirates are unacceptable for Xpert Xpress SARS-CoV-2/FLU/RSV testing.  Fact Sheet for  Patients: EntrepreneurPulse.com.au  Fact Sheet for Healthcare Providers: IncredibleEmployment.be  This test is not yet approved or cleared by the [place] FDA and has been authorized for detection and/or diagnosis of SARS-CoV-2 by FDA under an Emergency Use Authorization (EUA). This EUA will remain in effect (meaning this test can be used) for the duration of the COVID-19 declaration under Section 564(b)(1) of the Act, 21 U.S.C. section 360bbb-3(b)(1), unless the authorization is terminated or revoked.  Performed at [facility], [address], [place], [place] [address]   Blood culture (routine x 2)     Status: None (Preliminary result)   Collection Time: 10/14/20  3:00 AM   Specimen: BLOOD  Result Value Ref Range Status   Specimen Description BLOOD LEFT ANTECUBITAL  Final   Special Requests   Final    BOTTLES DRAWN AEROBIC AND ANAEROBIC Blood Culture adequate volume   Culture   Final    NO GROWTH 1 DAY Performed at [facility], [place] [address], [place], [place] [address]    Report Status PENDING  Incomplete  Blood culture (routine x 2)     Status: None (Preliminary result)   Collection Time: 10/14/20  3:11 AM   Specimen: BLOOD LEFT HAND  Result Value Ref Range Status   Specimen Description BLOOD LEFT HAND  Final   Special Requests   Final    BOTTLES DRAWN AEROBIC AND ANAEROBIC Blood Culture results may not be optimal due to an inadequate volume of blood received in culture bottles   Culture   Final    NO GROWTH 1 DAY Performed at [facility], [place] [address], [place], [place] [address]    Report Status PENDING  Incomplete     Time coordinating discharge: Over 30 minutes  SIGNED:   Charlynne Cousins, MD  [facility] 10/16/2020, 7:15 AM Pager   If 7PM-7AM, please contact night-coverage [email or web address] Password [email or web address]

## 2020-10-16 NOTE — Progress Notes (Signed)
Mobility Specialist: Progress Note   10/16/20 1429  Mobility  Activity Refused mobility   Pt refused mobility stating her ride would be here in 20 minutes. Encouraged pt to continue walks at home.   [facility] Khloi Rawl Mobility Specialist Mobility Specialist Phone: [phone]

## 2020-10-16 NOTE — Plan of Care (Signed)

## 2020-10-19 LAB — CULTURE, BLOOD (ROUTINE X 2)
Culture: NO GROWTH
Culture: NO GROWTH
Special Requests: ADEQUATE

## 2020-10-24 ENCOUNTER — Encounter: Payer: Self-pay | Admitting: Gastroenterology

## 2020-10-24 ENCOUNTER — Other Ambulatory Visit: Payer: Self-pay

## 2020-10-24 ENCOUNTER — Ambulatory Visit: Payer: Medicare PPO | Admitting: Gastroenterology

## 2020-10-24 VITALS — BP 105/59 | HR 68 | Temp 96.2°F | Ht 65.0 in | Wt 266.8 lb

## 2020-10-24 DIAGNOSIS — D500 Iron deficiency anemia secondary to blood loss (chronic): Secondary | ICD-10-CM

## 2020-10-24 MED ORDER — SYMPROIC 0.2 MG PO TABS: 1.0000 | ORAL_TABLET | Freq: Every day | ORAL | 5 refills | Status: DC

## 2020-10-24 NOTE — Patient Instructions (Signed)
Please call if you have any worsening pain.   I have sent in Symproic to your pharmacy. Take this once each morning, with or without food. We may need to do a prior authorization with insurance.  We will see if you are a candidate for assistance with Dexilant.  I am trying to get ferritin results from [facility]. If none on file, we will need to update this. Will arrange iron infusions in the near future!  We will see you in 3-4 months!  It was a pleasure to see you today. I want to create trusting relationships with patients to provide genuine, compassionate, and quality care. I value your feedback. If you receive a survey regarding your visit,  I greatly appreciate you taking time to fill this out.   Annitta Needs, PhD, ANP-BC [facility]

## 2020-10-24 NOTE — Progress Notes (Signed)
Referring Provider: Wendi Maya Primary Care Physician:  Wendi Maya, NP Primary GI: Dr. Abbey Chatters  Chief Complaint  Patient presents with  . Diverticulitis    Hosp f/u. Having abd pain in mid lower abd  . Anemia    HPI:   Heidi [name] is a 65 y.o. female presenting today with a history of GERD, constipation, found to have IDA late last year. EGD Sept 2021 with reactive gastropathy, negative H.pylori, and colonoscopy Dec 2021 with tubular adenoma and surveillance due in 5 years.   Episode of uncomplicated diverticulitis in Feb 2022. Prior to hospitalization startd having lower abdominal pain acutely. Felt life go out of her. Felt like she needed to have a BM but couldn't. No fever or chills. Pain improved with antibiotics. Chronic constipation. BM usually once per week. Trialed and failed multiple constipation medications including Linzess, Amitiza, Trulance, Movantik.  She finally found success with Symproic per last notes but doesn't remember taking this. Currently not taking.   Dexilant not covered well by insurance.   Last Hgb 8.25 Sep 2020. Hgb 10 in Aug 2021. No recent ferritin on file. Appears labs from March 2022 as outpatient with Hgb 9.2 but difficult to tell due to fax quality. Will be requesting labs and ferritin levels. Creatinine 0.88. Unable to tolerate oral iron due to constipation.   Consideration for capsule study in past but has pacemaker.    Past Medical History:  Diagnosis Date  . Anemia    takes iron supplement  . Anxiety   . Arthritis   . Asthma    prn neb.  . Cancer [facility])    right breast cancer  . Depression   . Diabetes mellitus    IDDM  . Diverticulosis   . Environmental allergies   . GERD (gastroesophageal reflux disease)   . Headache(784.0)    migraines  . History of MRSA infection 08/2012   right breast  . Hypertension    under control with med., has been on med. x 10-15 yr.  . Overactive bladder   . PONV  (postoperative nausea and vomiting)    used scop patch 7/14-please use again per pt request  . Sjogren's disease ([place])   . Sleep apnea    uses CPAP nightly    Past Surgical History:  Procedure Laterality Date  . ANKLE ARTHROSCOPY Left   . BIOPSY  05/15/2020   Procedure: BIOPSY;  Surgeon: Eloise Harman, DO;  [facility];  Service: Endoscopy;;  gastric   . BREAST RECONSTRUCTION  05/24/2012   Procedure: BREAST RECONSTRUCTION;  Surgeon: Cristine Polio, MD;  Location: [place];  Service: Plastics;  Laterality: Right;  NO FLEX HD  . BREAST RECONSTRUCTION Right 01/17/2013   Procedure: RIGHT BREAST RECONSTRUCTION WITH PLACEMENT OF TISSUE EXPANDER ;  Surgeon: Cristine Polio, MD;  Location: [place];  Service: Plastics;  Laterality: Right;  . BREAST REDUCTION SURGERY Left 02/28/2013   Procedure: LEFT BREAST REDUCTION  (BREAST);  Surgeon: Cristine Polio, MD;  Location: [place];  Service: Plastics;  Laterality: Left;  . CESAREAN SECTION    . CHOLECYSTECTOMY    . COLONOSCOPY WITH PROPOFOL N/A 08/14/2020   non-bleeding internal hemorrhoids, multiple small mouth diverticula, 5 mm polyp in descending colon, fair prep. Tubular adenoma. Surveillance in 5 years.   . ESOPHAGOGASTRODUODENOSCOPY (EGD) WITH PROPOFOL N/A 05/15/2020   gastritis, reactive gastropathy. negative H.pylori. one gastric polyp. normal duodenum  . INSERT /  REPLACE / REMOVE PACEMAKER    . MASTECTOMY Right 03/2012  . POLYPECTOMY  05/15/2020   Procedure: POLYPECTOMY;  Surgeon: Eloise Harman, DO;  [facility];  Service: Endoscopy;;  antral  . POLYPECTOMY  08/14/2020   Procedure: POLYPECTOMY;  Surgeon: Eloise Harman, DO;  [facility];  Service: Endoscopy;;  . REMOVAL OF TISSUE EXPANDER AND PLACEMENT OF IMPLANT Right 05/10/2013   Procedure: RIGHT REMOVAL OF TISSUE EXPANDER ;  Surgeon: Cristine Polio, MD;  Location: [place];   Service: Plastics;  Laterality: Right;  . SHOULDER ARTHROSCOPY Right   . TISSUE EXPANDER PLACEMENT  05/24/2012   Procedure: TISSUE EXPANDER;  Surgeon: Cristine Polio, MD;  Location: [place];  Service: Plastics;  Laterality: Right;    Current Outpatient Medications  Medication Sig Dispense Refill  . allopurinol (ZYLOPRIM) 100 MG tablet Take 100 mg by mouth daily.    . benazepril-hydrochlorthiazide (LOTENSIN HCT) 20-25 MG tablet Take 1 tablet by mouth daily.    . benztropine (COGENTIN) 0.5 MG tablet Take 0.5 mg by mouth 2 (two) times daily.    . clonazePAM (KLONOPIN) 0.5 MG tablet Take 0.5 mg by mouth at bedtime as needed for anxiety (sleep).    . colchicine 0.6 MG tablet Take 0.6 mg by mouth daily as needed (gout flare).    . DULoxetine (CYMBALTA) 60 MG capsule Take 60 mg by mouth 2 (two) times daily.    . folic acid (FOLVITE) 1 MG tablet Take 1 mg by mouth daily.    . furosemide (LASIX) 20 MG tablet Take 20 mg by mouth daily.    . hydroxychloroquine (PLAQUENIL) 200 MG tablet Take 400 mg by mouth daily.    Marland Kitchen lamoTRIgine (LAMICTAL) 25 MG tablet Take 25 mg by mouth 2 (two) times daily.    Marland Kitchen lidocaine (LIDODERM) 5 % Place 1 patch onto the skin daily as needed for pain.    . metFORMIN (GLUCOPHAGE) 1000 MG tablet Take 1,000 mg by mouth 2 (two) times daily with a meal.    . methadone (DOLOPHINE) 5 MG tablet Take 5 mg by mouth 2 (two) times daily.    . methotrexate 2.5 MG tablet Take 4 tablets by mouth every Friday.    . modafinil (PROVIGIL) 200 MG tablet Take 200 mg by mouth daily.    Melynda Ripple Tosylate (SYMPROIC) 0.2 MG TABS Take 1 tablet by mouth daily. With or without food 30 tablet 5  . NYSTATIN powder Apply 2-3 application topically daily.    Marland Kitchen omeprazole (PRILOSEC) 40 MG capsule Take 1 capsule (40 mg total) by mouth in the morning and at bedtime. 90 capsule 3  . oxybutynin (DITROPAN) 5 MG tablet Take 5 mg by mouth 3 (three) times daily.    . prednisoLONE acetate (PRED  FORTE) 1 % ophthalmic suspension Place 1 drop into both eyes daily as needed (dry eyes).    . pregabalin (LYRICA) 100 MG capsule Take 100 mg by mouth 2 (two) times daily.    . propranolol (INDERAL) 10 MG tablet Take 10 mg by mouth 2 (two) times daily.     No current facility-administered medications for this visit.    Allergies as of 10/24/2020 - Review Complete 10/24/2020  Allergen Reaction Noted  . Soap Other (See Comments) 02/22/2013  . Vraylar [cariprazine]  08/03/2020  . Latex Itching 02/22/2013  . Sulfa antibiotics Rash 05/20/2012    Family History  Problem Relation Age of Onset  . Colon cancer Paternal  Grandmother   . Colon cancer Paternal Grandfather     Social History   Socioeconomic History  . Marital status: Married    Spouse name: Not on file  . Number of children: Not on file  . Years of education: Not on file  . Highest education level: Not on file  Occupational History  . Not on file  Tobacco Use  . Smoking status: Never Smoker  . Smokeless tobacco: Never Used  Vaping Use  . Vaping Use: Never used  Substance and Sexual Activity  . Alcohol use: No  . Drug use: No  . Sexual activity: Not Currently  Other Topics Concern  . Not on file  Social History Narrative  . Not on file   Social Determinants of Health   Financial Resource Strain: Not on file  Food Insecurity: Not on file  Transportation Needs: Not on file  Physical Activity: Not on file  Stress: Not on file  Social Connections: Not on file    Review of Systems: Gen: Denies fever, chills, anorexia. Denies fatigue, weakness, weight loss.  CV: Denies chest pain, palpitations, syncope, peripheral edema, and claudication. Resp: Denies dyspnea at rest, cough, wheezing, coughing up blood, and pleurisy. GI: see HPI Derm: Denies rash, itching, dry skin Psych: Denies depression, anxiety, memory loss, confusion. No homicidal or suicidal ideation.  Heme: Denies bruising, bleeding, and enlarged lymph  nodes.  Physical Exam: BP (!) 105/59   Pulse 68   Temp (!) 96.2 F (35.7 C) (Temporal)   Ht 5\' 5"  (1.651 m)   Wt 266 lb 12.8 oz (121 kg)   LMP 05/20/2012   BMI 44.40 kg/m  General:   Alert and oriented. No distress noted. Pleasant and cooperative.  Head:  Normocephalic and atraumatic. Eyes:  Conjuctiva clear without scleral icterus. Mouth:  Mask in place Abdomen:  +BS, soft, non-tender, obese. Right-sided bulging query incisional hernia. Limited exam with patient sitting in chair.   Msk:  Symmetrical without gross deformities. Normal posture. Extremities:  Without edema. Neurologic:  Alert and  oriented x4 Psych:  Alert and cooperative. Normal mood and affect.  ASSESSMENT: Heidi [name] is a 65 y.o. female presenting today with a history of GERD, constipation, found to have IDA late last year. EGD Sept 2021 with reactive gastropathy, negative H.pylori, and colonoscopy Dec 2021 with tubular adenoma and surveillance due in 5 years. Returning now in follow-up after recent uncomplicated diverticulitis in Feb 2022.   GERD: Dexilant not covered well by insurance. Will attempt patient assistance. In meantime, continue omeprazole 40 mg BID.   IDA: EGD and colonoscopy on file. Ideally, would pursue capsule, but she has a pacemaker. Could consider CTE. Will order celiac serologies. She also does not have a ferritin on file. Will request from PCP if available. If not, will order. Unable to tolerate oral iron. Anticipate Hematology referral.   Diverticulitis: uncomplicated. Improved. Recent colonoscopy is on file, which is reassuring.  Constipation: opioid-induced. Failure of multiple prior meds including  Linzess, Amitiza, Trulance, Movantik. Will resume Symproic, which I believe she has taken in the past per notes but does not remember this.    PLAN:  Obtain outside ferritin results if available Celiac serologies Dexilant patient assistance, otherwise continue omeprazole Symproic 0.2  mg with or without food daily Likely Hematology referral Return in 3-4 months   Annitta Needs, PhD, [facility]

## 2020-10-30 ENCOUNTER — Encounter: Payer: Self-pay | Admitting: Gastroenterology

## 2020-10-30 ENCOUNTER — Telehealth: Payer: Self-pay | Admitting: Gastroenterology

## 2020-10-30 NOTE — Telephone Encounter (Signed)
NOTED

## 2020-10-30 NOTE — Telephone Encounter (Signed)
requested

## 2020-10-30 NOTE — Telephone Encounter (Signed)
Heidi [name], can we see if a ferritin level was ever drawn at [facility], and can we also get the CBC from March 2022 that is better quality? Hard to see it from the fax they sent.  [name]: I ordered celiac serologies, which have printed. We need patient to complete this. IF PCP doesn't have a ferritin on file, we can have ferritin completed as well. I'm waiting to see if they have this.

## 2020-10-31 ENCOUNTER — Other Ambulatory Visit: Payer: Self-pay

## 2020-10-31 ENCOUNTER — Telehealth: Payer: Self-pay | Admitting: Internal Medicine

## 2020-10-31 DIAGNOSIS — D500 Iron deficiency anemia secondary to blood loss (chronic): Secondary | ICD-10-CM

## 2020-10-31 DIAGNOSIS — D509 Iron deficiency anemia, unspecified: Secondary | ICD-10-CM

## 2020-10-31 NOTE — Telephone Encounter (Signed)
PLEASE CALL PATIENT, SHE HAS SOME QUESTIONS

## 2020-10-31 NOTE — Telephone Encounter (Signed)
Is she willing to see Hematology for IDA and have them arrange iron infusions? If so, that would be the easiest. If not, I can arrange.  Let's go ahead and have her do the celiac serologies and add a ferritin level to the labs to have done.   I sent in Symproic to her pharmacy in [place]. Did they not get it?

## 2020-10-31 NOTE — Telephone Encounter (Signed)
I phoned and spoke with the pt and she agreed with letting Hematology arrange iron infusions. Pt wants you to call her PCP [facility]) to have them do her labs tomorrow because she will already be going there for a B12 injection. I asked her about the Rx and she stated she didn't have it, I advised her it was phoned in and they signed for it. She is calling the pharmacy back and checking to see if it has been filled if not she will call us back

## 2020-10-31 NOTE — Telephone Encounter (Signed)
Do I need to add anymore labs before contacting this pt or have the PCP made contact with you.

## 2020-10-31 NOTE — Telephone Encounter (Signed)
I returned the pt's call and she has some questions for you.  Are you still planning the iron infusions for her. Did you send in a Rx for her. (it wasn't stated what it was).    Please advise

## 2020-10-31 NOTE — Telephone Encounter (Signed)
Please just fax over the ferritin and celiac serologies orders (the print out we already have) and let patient know we have faxed the orders to her doctor that can be done tomorrow.

## 2020-11-01 ENCOUNTER — Telehealth: Payer: Self-pay

## 2020-11-01 NOTE — Telephone Encounter (Signed)
Spoke with Engineer, building services at [facility], in [place], [place], ( pts PCP) they have still not received the lab orders and cannot see them in their system. They use [facility] and labs were put in for [facility]. I went in and changed the orders to [place] and informed her of what labs Vicente Males was wanting. She said she could not find the IgA or the TTg IgA in their system and they would not be able to draw this blood work. Lonn Georgia said the pt will need to go somewhere else. She said she would tell the pt that she could not have them drawn there and she will have her call the office.  Lonn Georgia also stated she had a ferritin that there is a recent ferritin that was drawn by [facility] in the system and she will send it to [place].  I gave lab orders that printed to [place] and informed her that pt will be calling and will need to go to [facility] here in [place] to have these done, unless the patient is aware of an outpatient [facility] elsewhere.

## 2020-11-01 NOTE — Telephone Encounter (Signed)
1.Pt's labs were faxed to her PCP this morning. Pt stated to me yesterday that she wants to have her blood work done before you arrange for her to go to Hematology for her appt. I advised the pt I would let you know.   2.phoned the pt and advised her of the labs being sent this morning and she inquired about the medication of which needs a PA and I advised her I received it yesterday and getting help to get it done.

## 2020-11-01 NOTE — Telephone Encounter (Signed)
1. PA for Symproic 0.2 mg tablets approved for the pt.  2. Will advise the pharmacy/patient of this authorization.  3. Will give to Manuela Schwartz to scan into the pt's chart.

## 2020-11-01 NOTE — Addendum Note (Signed)
Addended by: Claudina Lick on: 11/01/2020 03:48 PM   Modules accepted: Orders

## 2020-11-02 NOTE — Telephone Encounter (Signed)
Phoned and spoke with the pt regarding leaving her lab forms up front to be picked up. Pt stated my be Monday before she gets here.

## 2020-11-05 NOTE — Addendum Note (Signed)
Addended by: Claudina Lick on: 11/05/2020 09:40 AM   Modules accepted: Orders

## 2020-11-05 NOTE — Telephone Encounter (Signed)
Pt called in and asked if she could go to [facility] in [place]. I called pt back and advised her that we can send orders to [facility] but if they cannot do them she will need to go to [place]. Also advised her that there is a [facility] in [place] and gave her the address. She stated she was aware of the location and that she will go there if [facility] is unable to complete labs as ordered. I was unable to change the lab location on the ferritin lab. I contacted Vicente Males and she said it was ok to add a ferritin to the labs she put in at the pts office visit. Orders done and Lab orders printed. I have faxed the orders to [phone], which is the fax number the pt gave [place]. Pt is planning on gong to have them drawn tomorrow.

## 2020-11-13 ENCOUNTER — Telehealth: Payer: Self-pay

## 2020-11-13 DIAGNOSIS — D500 Iron deficiency anemia secondary to blood loss (chronic): Secondary | ICD-10-CM

## 2020-11-13 NOTE — Telephone Encounter (Signed)
Spoke with the pt and the surgery she is having is on both of her shoulders. She doesn't want to go under the knife if her Hgb is low. Pt does want a referral to Hematology but in the meantime after you have discussion with Dr. Abbey Chatters she wants you or Dr. Abbey Chatters to send Dr. Tamera Punt @ [facility] a letter explaining her condition with her blood levels and when she can be released to have her surgery. Pt would like to get your take on it from your point of view then we can see what blood work is needed.

## 2020-11-13 NOTE — Telephone Encounter (Signed)
Pt is wanting results of her blood work so she can get her surgery scheduled. She asked for Magda Paganini, but I see where you seen her last. Pt is asking for a call back

## 2020-11-13 NOTE — Telephone Encounter (Signed)
I'm not sure what surgery she is talking about?  Labs dated 3/22:  Ferritin low at 10.2, IgA low at 61, TTg, IgA normal.   Hgb 9.2 at PCP in early March 2022.  She has persistent IDA, s/p colonoscopy/EGD evaluation already. She has a pacemaker, so capsule study not ideal. I will have to talk with Dr. Abbey Chatters regarding CTE if needed.   Her IgA is also low, which means we need additional testing if she is willing to exclude celiac disease completely. Since IgA is low, the TTg, IgA may not be correct.   1. Recommend referral to Hematology due to [place] 2. If willing, have her complete IgG and TTg, IgG 3. I will talk with Dr. Abbey Chatters about CTE to wrap up IDA evaluation.

## 2020-11-14 ENCOUNTER — Encounter: Payer: Self-pay | Admitting: Gastroenterology

## 2020-11-14 NOTE — Addendum Note (Signed)
Addended by: Annitta Needs on: 11/14/2020 11:52 AM   Modules accepted: Orders

## 2020-11-14 NOTE — Telephone Encounter (Signed)
[name]:  I spoke with patient. Reviewed options with her that I discussed with Dr. Abbey Chatters. We could attempt capsule if she was willing, and if not, then we would do CTE. She wants to hold off on both at this time due to financial reasons.   She is willing to have a Hematology referral. She also needs the IgG and TTg, IgG faxed to [facility] in [place]. I've ordered this so it just needs to be released.   I have also completed a letter that needs to be faxed to Dr. Tamera Punt at [facility] with an update on her condition.    So in summary:   1. Please fax labs to [facility] 2. Letter to Dr. Tamera Punt 3. [facility] clinical pool: needs referral to Hematology for IDA   Thanks!

## 2020-11-14 NOTE — Progress Notes (Signed)
Note sent to Dr. Tamera Punt at patient's request.

## 2020-11-15 ENCOUNTER — Other Ambulatory Visit: Payer: Self-pay

## 2020-11-15 DIAGNOSIS — D500 Iron deficiency anemia secondary to blood loss (chronic): Secondary | ICD-10-CM

## 2020-11-16 NOTE — Addendum Note (Signed)
Addended by: Cheron Every on: 11/16/2020 08:59 AM   Modules accepted: Orders

## 2020-11-16 NOTE — Telephone Encounter (Signed)
Faxed labs to [facility] @ [phone]  2. Faxed letter to Dr. Malena Catholic, MD  3. Phoned and LM on vm of the pt regarding the faxing of her lab orders and letter to Dr. Malena Catholic, MD  4. [facility] Clinical Pool please send referral to Hematology for IDA per Vicente Males

## 2020-11-21 ENCOUNTER — Telehealth: Payer: Self-pay | Admitting: Internal Medicine

## 2020-11-21 NOTE — Telephone Encounter (Signed)
Patient  called inquiring about if her lab results from last week were complete

## 2020-11-22 ENCOUNTER — Telehealth: Payer: Self-pay | Admitting: Internal Medicine

## 2020-11-22 ENCOUNTER — Telehealth: Payer: Self-pay

## 2020-11-22 DIAGNOSIS — R197 Diarrhea, unspecified: Secondary | ICD-10-CM

## 2020-11-22 NOTE — Telephone Encounter (Signed)
Already sent to [facility]

## 2020-11-22 NOTE — Telephone Encounter (Signed)
Already sent a phone note to Vicente Males, I do not have any results. Nothing has been given to me by Vicente Males.

## 2020-11-22 NOTE — Telephone Encounter (Signed)
Heidi [name],  Pt is wanting to speak with you regarding lab results you have. I have already advised I don't have any. I have not looked on your desk to see if labs were put there. Please call this pt in the morning to address this.

## 2020-11-22 NOTE — Telephone Encounter (Signed)
Pt calling again today to follow up on her labs from last week. [phone]

## 2020-11-23 NOTE — Telephone Encounter (Signed)
TTg, IgG is negative. She has had negative celiac serologies. I called and left message.

## 2020-11-26 ENCOUNTER — Other Ambulatory Visit: Payer: Self-pay | Admitting: *Deleted

## 2020-11-26 DIAGNOSIS — R197 Diarrhea, unspecified: Secondary | ICD-10-CM

## 2020-11-26 NOTE — Telephone Encounter (Signed)
Noted lab released and called to get fax number and they are closed. Will call 11/27/2020 and fax orders.

## 2020-11-26 NOTE — Telephone Encounter (Signed)
States she has lost 20 lbs. Having diarrhea. Hasn't started Symproic. No sick contacts. Bottled water. No recent antibiotics. Eats anything and has postprandial loose stools, up to 3 per day. Symptoms just came on out of the blue. No N/V.    Would like to have stool studies done in [place] at [facility]. I have ordered these, just need to be released. Please fax to [facility] in [place], [place]. She has had labs done there previously. Thank you :)

## 2020-11-26 NOTE — Telephone Encounter (Signed)
Spoke with pt. Pt's results were given. Pt states she still needs to talk with Roseanne Kaufman, NP.

## 2020-11-26 NOTE — Addendum Note (Signed)
Addended by: Annitta Needs on: 11/26/2020 04:08 PM   Modules accepted: Orders

## 2020-12-03 ENCOUNTER — Inpatient Hospital Stay (HOSPITAL_COMMUNITY): Payer: Medicare PPO | Admitting: Hematology

## 2020-12-03 NOTE — Progress Notes (Deleted)
[place], [place] [address]   CLINIC:  Medical Oncology/Hematology  CONSULT NOTE  Patient Care Team: Wendi Maya, NP as PCP - General Abbey Chatters, Elon Alas, DO as Consulting Physician (Internal Medicine)  CHIEF COMPLAINTS/PURPOSE OF CONSULTATION:  Iron deficiency anemia  HISTORY OF PRESENTING ILLNESS:  Heidi [name] 65 y.o. female is here at the request of NP Roseanne Kaufman [facility]) for iron deficiency anemia.  Patient has had intermittent melena ongoing since ***.  However, gastroenterology work-up thus far has not found specific source of bleeding.  She had EGD on 05/15/2020 which showed gastritis and one gastric polyp.  Colonoscopy on 08/14/2020 showed nonbleeding internal hemorrhoids, diverticulosis, and one polyp.  Patient was unable to have capsule endoscopy due to the fact that she has an implanted pacemaker.  Of note, patient was hospitalized in February/March 2022 with acute diverticulitis.  Most recent labs (10/14/2020) show microcytic anemia with hemoglobin 70.6 0.4 and hemoglobin 8.8.  No recent iron panel is available.  ***She was found to have abnormal CBC from *** ***She denies recent chest pain on exertion, shortness of breath on minimal exertion, pre-syncopal episodes, or palpitations. ***She had not noticed any recent bleeding such as epistaxis, hematuria, hematochezia, or melena. ***The patient denies over the counter NSAID ingestion. She is not *** on antiplatelets agents. Her last colonoscopy was *** ***She had no prior history or diagnosis of cancer. Her age appropriate screening programs are up-to-date. ***She denies any pica and eats a variety of diet. ***She never donated blood or received blood transfusion ***The patient was prescribed oral iron supplements and she takes ***   Other PMH: Patient does have a history of breast cancer, s/p resection ***  Social history ***  Family  history ***  MEDICAL HISTORY:  Past Medical History:  Diagnosis Date  . Anemia    takes iron supplement  . Anxiety   . Arthritis   . Asthma    prn neb.  . Cancer [facility])    right breast cancer  . Depression   . Diabetes mellitus    IDDM  . Diverticulosis   . Environmental allergies   . GERD (gastroesophageal reflux disease)   . Headache(784.0)    migraines  . History of MRSA infection 08/2012   right breast  . Hypertension    under control with med., has been on med. x 10-15 yr.  . Overactive bladder   . PONV (postoperative nausea and vomiting)    used scop patch 7/14-please use again per pt request  . Sjogren's disease ([place])   . Sleep apnea    uses CPAP nightly    SURGICAL HISTORY: Past Surgical History:  Procedure Laterality Date  . ANKLE ARTHROSCOPY Left   . BIOPSY  05/15/2020   Procedure: BIOPSY;  Surgeon: Eloise Harman, DO;  [facility];  Service: Endoscopy;;  gastric   . BREAST RECONSTRUCTION  05/24/2012   Procedure: BREAST RECONSTRUCTION;  Surgeon: Cristine Polio, MD;  Location: [place];  Service: Plastics;  Laterality: Right;  NO FLEX HD  . BREAST RECONSTRUCTION Right 01/17/2013   Procedure: RIGHT BREAST RECONSTRUCTION WITH PLACEMENT OF TISSUE EXPANDER ;  Surgeon: Cristine Polio, MD;  Location: [place];  Service: Plastics;  Laterality: Right;  . BREAST REDUCTION SURGERY Left 02/28/2013   Procedure: LEFT BREAST REDUCTION  (BREAST);  Surgeon: Cristine Polio, MD;  Location: [place];  Service: Plastics;  Laterality: Left;  .  CESAREAN SECTION    . CHOLECYSTECTOMY    . COLONOSCOPY WITH PROPOFOL N/A 08/14/2020   non-bleeding internal hemorrhoids, multiple small mouth diverticula, 5 mm polyp in descending colon, fair prep. Tubular adenoma. Surveillance in 5 years.   . ESOPHAGOGASTRODUODENOSCOPY (EGD) WITH PROPOFOL N/A 05/15/2020   gastritis, reactive gastropathy. negative H.pylori. one gastric polyp.  normal duodenum  . INSERT / REPLACE / REMOVE PACEMAKER    . MASTECTOMY Right 03/2012  . POLYPECTOMY  05/15/2020   Procedure: POLYPECTOMY;  Surgeon: Eloise Harman, DO;  [facility];  Service: Endoscopy;;  antral  . POLYPECTOMY  08/14/2020   Procedure: POLYPECTOMY;  Surgeon: Eloise Harman, DO;  [facility];  Service: Endoscopy;;  . REMOVAL OF TISSUE EXPANDER AND PLACEMENT OF IMPLANT Right 05/10/2013   Procedure: RIGHT REMOVAL OF TISSUE EXPANDER ;  Surgeon: Cristine Polio, MD;  Location: [place];  Service: Plastics;  Laterality: Right;  . SHOULDER ARTHROSCOPY Right   . TISSUE EXPANDER PLACEMENT  05/24/2012   Procedure: TISSUE EXPANDER;  Surgeon: Cristine Polio, MD;  Location: [place];  Service: Plastics;  Laterality: Right;    SOCIAL HISTORY: Social History   Socioeconomic History  . Marital status: Married    Spouse name: Not on file  . Number of children: Not on file  . Years of education: Not on file  . Highest education level: Not on file  Occupational History  . Not on file  Tobacco Use  . Smoking status: Never Smoker  . Smokeless tobacco: Never Used  Vaping Use  . Vaping Use: Never used  Substance and Sexual Activity  . Alcohol use: No  . Drug use: No  . Sexual activity: Not Currently  Other Topics Concern  . Not on file  Social History Narrative  . Not on file   Social Determinants of Health   Financial Resource Strain: Not on file  Food Insecurity: Not on file  Transportation Needs: Not on file  Physical Activity: Not on file  Stress: Not on file  Social Connections: Not on file  Intimate Partner Violence: Not on file    FAMILY HISTORY: Family History  Problem Relation Age of Onset  . Colon cancer Paternal Grandmother   . Colon cancer Paternal Grandfather     ALLERGIES:  is allergic to soap, vraylar [cariprazine], latex, and sulfa antibiotics.  MEDICATIONS:  Current Outpatient  Medications  Medication Sig Dispense Refill  . allopurinol (ZYLOPRIM) 100 MG tablet Take 100 mg by mouth daily.    Marland Kitchen amoxicillin-clavulanate (AUGMENTIN) 875-125 MG tablet TAKE 1 TABLET BY MOUTH EVERY TWELVE HOURS FOR 10 DAYS. 20 tablet 0  . benazepril-hydrochlorthiazide (LOTENSIN HCT) 20-25 MG tablet Take 1 tablet by mouth daily.    . benztropine (COGENTIN) 0.5 MG tablet Take 0.5 mg by mouth 2 (two) times daily.    . clonazePAM (KLONOPIN) 0.5 MG tablet Take 0.5 mg by mouth at bedtime as needed for anxiety (sleep).    . colchicine 0.6 MG tablet Take 0.6 mg by mouth daily as needed (gout flare).    . DULoxetine (CYMBALTA) 60 MG capsule Take 60 mg by mouth 2 (two) times daily.    . folic acid (FOLVITE) 1 MG tablet Take 1 mg by mouth daily.    . furosemide (LASIX) 20 MG tablet Take 20 mg by mouth daily.    . hydroxychloroquine (PLAQUENIL) 200 MG tablet Take 400 mg by mouth daily.    Marland Kitchen lamoTRIgine (LAMICTAL) 25 MG tablet  Take 25 mg by mouth 2 (two) times daily.    Marland Kitchen lidocaine (LIDODERM) 5 % Place 1 patch onto the skin daily as needed for pain.    . metFORMIN (GLUCOPHAGE) 1000 MG tablet Take 1,000 mg by mouth 2 (two) times daily with a meal.    . methadone (DOLOPHINE) 5 MG tablet Take 5 mg by mouth 2 (two) times daily.    . methotrexate 2.5 MG tablet Take 4 tablets by mouth every Friday.    . modafinil (PROVIGIL) 200 MG tablet Take 200 mg by mouth daily.    Melynda Ripple Tosylate (SYMPROIC) 0.2 MG TABS Take 1 tablet by mouth daily. With or without food 30 tablet 5  . NYSTATIN powder Apply 2-3 application topically daily.    Marland Kitchen omeprazole (PRILOSEC) 40 MG capsule Take 1 capsule (40 mg total) by mouth in the morning and at bedtime. 90 capsule 3  . oxybutynin (DITROPAN) 5 MG tablet Take 5 mg by mouth 3 (three) times daily.    . prednisoLONE acetate (PRED FORTE) 1 % ophthalmic suspension Place 1 drop into both eyes daily as needed (dry eyes).    . pregabalin (LYRICA) 100 MG capsule Take 100 mg by mouth 2  (two) times daily.    . propranolol (INDERAL) 10 MG tablet Take 10 mg by mouth 2 (two) times daily.     No current facility-administered medications for this visit.    REVIEW OF SYSTEMS:   Review of Systems - Oncology    PHYSICAL EXAMINATION: ECOG PERFORMANCE STATUS: {CHL ONC ECOG [phone]}  There were no vitals filed for this visit. There were no vitals filed for this visit.  Physical Exam    LABORATORY DATA:  I have reviewed the data as listed Recent Results (from the past 2160 hour(s))  Lactic acid, plasma     Status: Abnormal   Collection Time: 10/13/20  1:43 AM  Result Value Ref Range   Lactic Acid, Venous 3.3 (HH) 0.5 - 1.9 mmol/L    Comment: CRITICAL VALUE NOTED.  VALUE IS CONSISTENT WITH PREVIOUSLY REPORTED AND CALLED VALUE. Performed at [facility], [place] [address] [place], [place] [address]   CBC with Differential     Status: Abnormal   Collection Time: 10/13/20  8:41 PM  Result Value Ref Range   WBC 8.2 4.0 - 10.5 K/uL   RBC 3.75 (L) 3.87 - 5.11 MIL/uL   Hemoglobin 9.2 (L) 12.0 - 15.0 g/dL   HCT 29.5 (L) 36.0 - 46.0 %   MCV 78.7 (L) 80.0 - 100.0 fL   MCH 24.5 (L) 26.0 - 34.0 pg   MCHC 31.2 30.0 - 36.0 g/dL   RDW 17.3 (H) 11.5 - 15.5 %   Platelets 226 150 - 400 K/uL   nRBC 0.0 0.0 - 0.2 %   Neutrophils Relative % 77 %   Neutro Abs 6.4 1.7 - 7.7 K/uL   Lymphocytes Relative 10 %   Lymphs Abs 0.8 0.7 - 4.0 K/uL   Monocytes Relative 10 %   Monocytes Absolute 0.8 0.1 - 1.0 K/uL   Eosinophils Relative 1 %   Eosinophils Absolute 0.1 0.0 - 0.5 K/uL   Basophils Relative 1 %   Basophils Absolute 0.1 0.0 - 0.1 K/uL   Immature Granulocytes 1 %   Abs Immature Granulocytes 0.04 0.00 - 0.07 K/uL    Comment: Performed at [facility], [place] [address] [place],  [address]  Comprehensive metabolic panel     Status: Abnormal  Collection Time: 10/13/20  8:41 PM  Result Value Ref Range   Sodium 131 (L) 135 - 145 mmol/L   Potassium 3.0 (L) 3.5 -  5.1 mmol/L   Chloride 97 (L) 98 - 111 mmol/L   CO2 17 (L) 22 - 32 mmol/L   Glucose, Bld 181 (H) 70 - 99 mg/dL    Comment: Glucose reference range applies only to samples taken after fasting for at least 8 hours.   BUN 23 8 - 23 mg/dL   Creatinine, Ser 1.44 (H) 0.44 - 1.00 mg/dL   Calcium 8.7 (L) 8.9 - 10.3 mg/dL   Total Protein 6.2 (L) 6.5 - 8.1 g/dL   Albumin 3.5 3.5 - 5.0 g/dL   AST 18 15 - 41 U/L   ALT 11 0 - 44 U/L   Alkaline Phosphatase 87 38 - 126 U/L   Total Bilirubin 0.5 0.3 - 1.2 mg/dL   GFR, Estimated 41 (L) >60 mL/min    Comment: (NOTE) Calculated using the CKD-EPI Creatinine Equation (2021)    Anion gap 17 (H) 5 - 15    Comment: Performed at [facility], [place] [address], [place], [place] [address]  Troponin I (High Sensitivity)     Status: None   Collection Time: 10/13/20  8:41 PM  Result Value Ref Range   Troponin I (High Sensitivity) 5 <18 ng/L    Comment: (NOTE) Elevated high sensitivity troponin I (hsTnI) values and significant  changes across serial measurements may suggest ACS but many other  chronic and acute conditions are known to elevate hsTnI results.  Refer to the "Links" section for chest pain algorithms and additional  guidance. Performed at [facility], [place] [address], [place], [place] [address]   Resp Panel by RT-PCR (Flu A&B, Covid) Nasopharyngeal Swab     Status: None   Collection Time: 10/13/20  9:06 PM   Specimen: Nasopharyngeal Swab; Nasopharyngeal(NP) swabs in vial transport medium  Result Value Ref Range   SARS Coronavirus 2 by RT PCR NEGATIVE NEGATIVE    Comment: (NOTE) SARS-CoV-2 target nucleic acids are NOT DETECTED.  The SARS-CoV-2 RNA is generally detectable in upper respiratory specimens during the acute phase of infection. The lowest concentration of SARS-CoV-2 viral copies this assay can detect is 138 copies/mL. A negative result does not preclude SARS-Cov-2 infection and should not be used as the sole basis for  treatment or other patient management decisions. A negative result may occur with  improper specimen collection/handling, submission of specimen other than nasopharyngeal swab, presence of viral mutation(s) within the areas targeted by this assay, and inadequate number of viral copies(<138 copies/mL). A negative result must be combined with clinical observations, patient history, and epidemiological information. The expected result is Negative.  Fact Sheet for Patients:  EntrepreneurPulse.com.au  Fact Sheet for Healthcare Providers:  IncredibleEmployment.be  This test is no t yet approved or cleared by the [place] FDA and  has been authorized for detection and/or diagnosis of SARS-CoV-2 by FDA under an Emergency Use Authorization (EUA). This EUA will remain  in effect (meaning this test can be used) for the duration of the COVID-19 declaration under Section 564(b)(1) of the Act, 21 U.S.C.section 360bbb-3(b)(1), unless the authorization is terminated  or revoked sooner.       Influenza A by PCR NEGATIVE NEGATIVE   Influenza B by PCR NEGATIVE NEGATIVE    Comment: (NOTE) The Xpert Xpress SARS-CoV-2/FLU/RSV plus assay is intended as an aid in the diagnosis of influenza  from Nasopharyngeal swab specimens and should not be used as a sole basis for treatment. Nasal washings and aspirates are unacceptable for Xpert Xpress SARS-CoV-2/FLU/RSV testing.  Fact Sheet for Patients: EntrepreneurPulse.com.au  Fact Sheet for Healthcare Providers: IncredibleEmployment.be  This test is not yet approved or cleared by the [place] FDA and has been authorized for detection and/or diagnosis of SARS-CoV-2 by FDA under an Emergency Use Authorization (EUA). This EUA will remain in effect (meaning this test can be used) for the duration of the COVID-19 declaration under Section 564(b)(1) of the Act, 21 U.S.C. section  360bbb-3(b)(1), unless the authorization is terminated or revoked.  Performed at [facility], [place] [address] [place], [place] [address]   Lactic acid, plasma     Status: Abnormal   Collection Time: 10/13/20  9:45 PM  Result Value Ref Range   Lactic Acid, Venous 5.2 (HH) 0.5 - 1.9 mmol/L    Comment: CRITICAL RESULT CALLED TO, READ BACK BY AND VERIFIED WITH: STRAUGHAN C,RN 10/13/20 2228 [id number] Performed at [facility], [place] [address], [place], [place] [address]   Troponin I (High Sensitivity)     Status: None   Collection Time: 10/14/20  1:43 AM  Result Value Ref Range   Troponin I (High Sensitivity) 4 <18 ng/L    Comment: (NOTE) Elevated high sensitivity troponin I (hsTnI) values and significant  changes across serial measurements may suggest ACS but many other  chronic and acute conditions are known to elevate hsTnI results.  Refer to the "Links" section for chest pain algorithms and additional  guidance. Performed at [facility], [place] [address], [place], [place] [address]   Blood culture (routine x 2)     Status: None   Collection Time: 10/14/20  3:00 AM   Specimen: BLOOD  Result Value Ref Range   Specimen Description BLOOD LEFT ANTECUBITAL    Special Requests      BOTTLES DRAWN AEROBIC AND ANAEROBIC Blood Culture adequate volume   Culture      NO GROWTH 5 DAYS Performed at [facility], [place] [address] [place], [place] [address]    Report Status 10/19/2020 FINAL   Blood culture (routine x 2)     Status: None   Collection Time: 10/14/20  3:11 AM   Specimen: BLOOD LEFT HAND  Result Value Ref Range   Specimen Description BLOOD LEFT HAND    Special Requests      BOTTLES DRAWN AEROBIC AND ANAEROBIC Blood Culture results may not be optimal due to an inadequate volume of blood received in culture bottles   Culture      NO GROWTH 5 DAYS Performed at [facility], [place] [address], [place], [place] [address]    Report Status 10/19/2020 FINAL    Lactic acid, plasma     Status: Abnormal   Collection Time: 10/14/20  5:33 AM  Result Value Ref Range   Lactic Acid, Venous 2.6 (HH) 0.5 - 1.9 mmol/L    Comment: CRITICAL VALUE NOTED.  VALUE IS CONSISTENT WITH PREVIOUSLY REPORTED AND CALLED VALUE. Performed at [facility], [place] [address], [place], [place] [address]   CBC with Differential/Platelet     Status: Abnormal   Collection Time: 10/14/20  5:33 AM  Result Value Ref Range   WBC 5.3 4.0 - 10.5 K/uL   RBC 3.68 (L) 3.87 - 5.11 MIL/uL   Hemoglobin 8.8 (L) 12.0 - 15.0 g/dL    Comment: Reticulocyte Hemoglobin  testing may be clinically indicated, consider ordering this additional test [id number]    HCT 28.1 (L) 36.0 - 46.0 %   MCV 76.4 (L) 80.0 - 100.0 fL   MCH 23.9 (L) 26.0 - 34.0 pg   MCHC 31.3 30.0 - 36.0 g/dL   RDW 17.2 (H) 11.5 - 15.5 %   Platelets 183 150 - 400 K/uL   nRBC 0.0 0.0 - 0.2 %   Neutrophils Relative % 63 %   Neutro Abs 3.3 1.7 - 7.7 K/uL   Lymphocytes Relative 24 %   Lymphs Abs 1.3 0.7 - 4.0 K/uL   Monocytes Relative 11 %   Monocytes Absolute 0.6 0.1 - 1.0 K/uL   Eosinophils Relative 1 %   Eosinophils Absolute 0.1 0.0 - 0.5 K/uL   Basophils Relative 1 %   Basophils Absolute 0.0 0.0 - 0.1 K/uL   Immature Granulocytes 0 %   Abs Immature Granulocytes 0.02 0.00 - 0.07 K/uL    Comment: Performed at [facility], [address] [place], [place] [address]  Comprehensive metabolic panel     Status: Abnormal   Collection Time: 10/14/20  5:33 AM  Result Value Ref Range   Sodium 134 (L) 135 - 145 mmol/L   Potassium 3.5 3.5 - 5.1 mmol/L   Chloride 101 98 - 111 mmol/L   CO2 22 22 - 32 mmol/L   Glucose, Bld 122 (H) 70 - 99 mg/dL    Comment: Glucose reference range applies only to samples taken after fasting for at least 8 hours.   BUN 22 8 - 23 mg/dL   Creatinine, Ser 1.19 (H) 0.44 - 1.00 mg/dL   Calcium 8.4 (L) 8.9 - 10.3 mg/dL   Total Protein 5.1 (L) 6.5 - 8.1 g/dL   Albumin 3.0 (L) 3.5 - 5.0 g/dL    AST 16 15 - 41 U/L   ALT 11 0 - 44 U/L   Alkaline Phosphatase 67 38 - 126 U/L   Total Bilirubin 0.7 0.3 - 1.2 mg/dL   GFR, Estimated 51 (L) >60 mL/min    Comment: (NOTE) Calculated using the CKD-EPI Creatinine Equation (2021)    Anion gap 11 5 - 15    Comment: Performed at [facility], [place] [address] [place], [place] [address]  Hemoglobin A1c     Status: Abnormal   Collection Time: 10/14/20  5:33 AM  Result Value Ref Range   Hgb A1c MFr Bld 6.0 (H) 4.8 - 5.6 %    Comment: (NOTE) Pre diabetes:          5.7%-6.4%  Diabetes:              >6.4%  Glycemic control for   <7.0% adults with diabetes    Mean Plasma Glucose 125.5 mg/dL    Comment: Performed at [place] [address], [place],  [address]  Magnesium     Status: Abnormal   Collection Time: 10/14/20  5:33 AM  Result Value Ref Range   Magnesium 1.6 (L) 1.7 - 2.4 mg/dL    Comment: Performed at [place] [address] [place], [place] [address]  Glucose, capillary     Status: None   Collection Time: 10/14/20  6:51 AM  Result Value Ref Range   Glucose-Capillary 94 70 - 99 mg/dL    Comment: Glucose reference range applies only to samples taken after fasting for at least 8 hours.   Comment 1 Notify RN  Comment 2 Document in Chart   Lactic acid, plasma     Status: Abnormal   Collection Time: 10/14/20  7:22 AM  Result Value Ref Range   Lactic Acid, Venous 2.0 (HH) 0.5 - 1.9 mmol/L    Comment: CRITICAL VALUE NOTED.  VALUE IS CONSISTENT WITH PREVIOUSLY REPORTED AND CALLED VALUE. Performed at [facility], [place] [address] [place], [place] [address]   Glucose, capillary     Status: Abnormal   Collection Time: 10/14/20 12:30 PM  Result Value Ref Range   Glucose-Capillary 136 (H) 70 - 99 mg/dL    Comment: Glucose reference range applies only to samples taken after fasting for at least 8 hours.  Glucose, capillary     Status: Abnormal   Collection Time: 10/14/20  4:43 PM  Result  Value Ref Range   Glucose-Capillary 184 (H) 70 - 99 mg/dL    Comment: Glucose reference range applies only to samples taken after fasting for at least 8 hours.  Glucose, capillary     Status: Abnormal   Collection Time: 10/14/20  9:19 PM  Result Value Ref Range   Glucose-Capillary 110 (H) 70 - 99 mg/dL    Comment: Glucose reference range applies only to samples taken after fasting for at least 8 hours.  Glucose, capillary     Status: Abnormal   Collection Time: 10/15/20  6:06 AM  Result Value Ref Range   Glucose-Capillary 111 (H) 70 - 99 mg/dL    Comment: Glucose reference range applies only to samples taken after fasting for at least 8 hours.  Basic metabolic panel     Status: Abnormal   Collection Time: 10/15/20 10:19 AM  Result Value Ref Range   Sodium 136 135 - 145 mmol/L   Potassium 3.7 3.5 - 5.1 mmol/L   Chloride 102 98 - 111 mmol/L   CO2 21 (L) 22 - 32 mmol/L   Glucose, Bld 113 (H) 70 - 99 mg/dL    Comment: Glucose reference range applies only to samples taken after fasting for at least 8 hours.   BUN 8 8 - 23 mg/dL   Creatinine, Ser 0.88 0.44 - 1.00 mg/dL   Calcium 8.6 (L) 8.9 - 10.3 mg/dL   GFR, Estimated >60 >60 mL/min    Comment: (NOTE) Calculated using the CKD-EPI Creatinine Equation (2021)    Anion gap 13 5 - 15    Comment: Performed at [place] [address] [place], [place] [address]  Glucose, capillary     Status: Abnormal   Collection Time: 10/15/20 11:22 AM  Result Value Ref Range   Glucose-Capillary 108 (H) 70 - 99 mg/dL    Comment: Glucose reference range applies only to samples taken after fasting for at least 8 hours.  Glucose, capillary     Status: None   Collection Time: 10/15/20  4:48 PM  Result Value Ref Range   Glucose-Capillary 95 70 - 99 mg/dL    Comment: Glucose reference range applies only to samples taken after fasting for at least 8 hours.  Glucose, capillary     Status: Abnormal   Collection Time: 10/15/20  9:53 PM  Result  Value Ref Range   Glucose-Capillary 113 (H) 70 - 99 mg/dL    Comment: Glucose reference range applies only to samples taken after fasting for at least 8 hours.  Glucose, capillary     Status: Abnormal   Collection Time: 10/16/20  6:05 AM  Result Value Ref Range   Glucose-Capillary  114 (H) 70 - 99 mg/dL    Comment: Glucose reference range applies only to samples taken after fasting for at least 8 hours.    RADIOGRAPHIC STUDIES: I have personally reviewed the radiological images as listed and agreed with the findings in the report. No results found.  ASSESSMENT: 1.  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  However, gastroenterology work-up thus far has not found specific source of bleeding. -EGD on 05/15/2020 which showed gastritis and one gastric polyp. -Colonoscopy on 08/14/2020 showed nonbleeding internal hemorrhoids, diverticulosis, and one polyp. -Patient is unable to have capsule endoscopy due to implanted pacemaker.   -Most recent labs (10/14/2020) show microcytic anemia with hemoglobin 70.6 0.4 and hemoglobin 8.8.  No recent iron panel is available. -*** Iron supplement ***  2.  Other history -PMH: *** -Social history: *** -Family history: ***   PLAN:  1.  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 The patient knows to  call the clinic with any problems, questions or concerns.   Medical decision making: ***  Time spent on visit: I spent {CHL ONC TIME VISIT - [id number]} counseling the patient face to face. The total time spent in the appointment was {CHL ONC TIME VISIT - [id number]} and more than 50% was on counseling.     Harriett Rush, PA-C 12/03/20 9:45 AM

## 2020-12-17 ENCOUNTER — Ambulatory Visit (HOSPITAL_COMMUNITY): Payer: Medicare PPO | Admitting: Hematology

## 2020-12-18 ENCOUNTER — Encounter: Payer: Self-pay | Admitting: Internal Medicine

## 2020-12-20 ENCOUNTER — Inpatient Hospital Stay (HOSPITAL_COMMUNITY): Payer: Medicare PPO | Attending: Hematology | Admitting: Hematology

## 2020-12-20 ENCOUNTER — Other Ambulatory Visit: Payer: Self-pay

## 2020-12-20 ENCOUNTER — Inpatient Hospital Stay (HOSPITAL_COMMUNITY): Payer: Medicare PPO

## 2020-12-20 ENCOUNTER — Ambulatory Visit (HOSPITAL_COMMUNITY): Payer: Medicare PPO | Admitting: Hematology

## 2020-12-20 VITALS — BP 121/54 | HR 88 | Temp 97.8°F | Resp 18 | Ht 66.0 in | Wt 274.7 lb

## 2020-12-20 DIAGNOSIS — Z86010 Personal history of colonic polyps: Secondary | ICD-10-CM | POA: Diagnosis not present

## 2020-12-20 DIAGNOSIS — E119 Type 2 diabetes mellitus without complications: Secondary | ICD-10-CM | POA: Diagnosis not present

## 2020-12-20 DIAGNOSIS — K921 Melena: Secondary | ICD-10-CM | POA: Insufficient documentation

## 2020-12-20 DIAGNOSIS — Z79899 Other long term (current) drug therapy: Secondary | ICD-10-CM | POA: Insufficient documentation

## 2020-12-20 DIAGNOSIS — M3500 Sicca syndrome, unspecified: Secondary | ICD-10-CM | POA: Insufficient documentation

## 2020-12-20 DIAGNOSIS — R5383 Other fatigue: Secondary | ICD-10-CM | POA: Insufficient documentation

## 2020-12-20 DIAGNOSIS — I10 Essential (primary) hypertension: Secondary | ICD-10-CM | POA: Diagnosis not present

## 2020-12-20 DIAGNOSIS — G894 Chronic pain syndrome: Secondary | ICD-10-CM | POA: Diagnosis not present

## 2020-12-20 DIAGNOSIS — Z86000 Personal history of in-situ neoplasm of breast: Secondary | ICD-10-CM | POA: Diagnosis not present

## 2020-12-20 DIAGNOSIS — F5089 Other specified eating disorder: Secondary | ICD-10-CM | POA: Insufficient documentation

## 2020-12-20 DIAGNOSIS — Z800 Family history of malignant neoplasm of digestive organs: Secondary | ICD-10-CM | POA: Diagnosis not present

## 2020-12-20 DIAGNOSIS — Z8614 Personal history of Methicillin resistant Staphylococcus aureus infection: Secondary | ICD-10-CM | POA: Diagnosis not present

## 2020-12-20 DIAGNOSIS — D500 Iron deficiency anemia secondary to blood loss (chronic): Secondary | ICD-10-CM | POA: Insufficient documentation

## 2020-12-20 LAB — CBC WITH DIFFERENTIAL/PLATELET
Abs Immature Granulocytes: 0.04 10*3/uL (ref 0.00–0.07)
Basophils Absolute: 0 10*3/uL (ref 0.0–0.1)
Basophils Relative: 1 %
Eosinophils Absolute: 0.1 10*3/uL (ref 0.0–0.5)
Eosinophils Relative: 2 %
HCT: 30.7 % — ABNORMAL LOW (ref 36.0–46.0)
Hemoglobin: 9 g/dL — ABNORMAL LOW (ref 12.0–15.0)
Immature Granulocytes: 1 %
Lymphocytes Relative: 22 %
Lymphs Abs: 1.3 10*3/uL (ref 0.7–4.0)
MCH: 24.4 pg — ABNORMAL LOW (ref 26.0–34.0)
MCHC: 29.3 g/dL — ABNORMAL LOW (ref 30.0–36.0)
MCV: 83.2 fL (ref 80.0–100.0)
Monocytes Absolute: 0.4 10*3/uL (ref 0.1–1.0)
Monocytes Relative: 7 %
Neutro Abs: 3.9 10*3/uL (ref 1.7–7.7)
Neutrophils Relative %: 67 %
Platelets: 257 10*3/uL (ref 150–400)
RBC: 3.69 MIL/uL — ABNORMAL LOW (ref 3.87–5.11)
RDW: 19.9 % — ABNORMAL HIGH (ref 11.5–15.5)
WBC: 5.8 10*3/uL (ref 4.0–10.5)
nRBC: 0 % (ref 0.0–0.2)

## 2020-12-20 LAB — COMPREHENSIVE METABOLIC PANEL
ALT: 12 U/L (ref 0–44)
AST: 16 U/L (ref 15–41)
Albumin: 3.6 g/dL (ref 3.5–5.0)
Alkaline Phosphatase: 89 U/L (ref 38–126)
Anion gap: 6 (ref 5–15)
BUN: 13 mg/dL (ref 8–23)
CO2: 28 mmol/L (ref 22–32)
Calcium: 9 mg/dL (ref 8.9–10.3)
Chloride: 102 mmol/L (ref 98–111)
Creatinine, Ser: 0.83 mg/dL (ref 0.44–1.00)
GFR, Estimated: >60 mL/min (ref >60)
Glucose, Bld: 121 mg/dL — ABNORMAL HIGH (ref 70–99)
Potassium: 4.2 mmol/L (ref 3.5–5.1)
Sodium: 136 mmol/L (ref 135–145)
Total Bilirubin: 0.6 mg/dL (ref 0.3–1.2)
Total Protein: 6.8 g/dL (ref 6.5–8.1)

## 2020-12-20 LAB — IRON AND TIBC
Iron: 23 ug/dL — ABNORMAL LOW (ref 28–170)
Saturation Ratios: 5 % — ABNORMAL LOW (ref 10.4–31.8)
TIBC: 430 ug/dL (ref 250–450)
UIBC: 407 ug/dL

## 2020-12-20 LAB — RETICULOCYTES
Immature Retic Fract: 29.8 % — ABNORMAL HIGH (ref 2.3–15.9)
RBC.: 3.64 MIL/uL — ABNORMAL LOW (ref 3.87–5.11)
Retic Count, Absolute: 81.5 10*3/uL (ref 19.0–186.0)
Retic Ct Pct: 2.2 % (ref 0.4–3.1)

## 2020-12-20 LAB — FOLATE: Folate: 40.9 ng/mL (ref >5.9)

## 2020-12-20 LAB — VITAMIN D 25 HYDROXY (VIT D DEFICIENCY, FRACTURES): Vit D, 25-Hydroxy: 20.74 ng/mL — ABNORMAL LOW (ref 30–100)

## 2020-12-20 LAB — LACTATE DEHYDROGENASE: LDH: 136 U/L (ref 98–192)

## 2020-12-20 LAB — VITAMIN B12: Vitamin B-12: 365 pg/mL (ref 180–914)

## 2020-12-20 LAB — FERRITIN: Ferritin: 10 ng/mL — ABNORMAL LOW (ref 11–307)

## 2020-12-20 NOTE — Patient Instructions (Addendum)
[place] at [facility] Discharge Instructions  You were seen today by Dr. Vickey Huger and [name] for your iron deficiency anemia.  This is most likely related to your GI bleeding of undetermined source.  However, we will check another test to rule out any other causes of anemia.  LABS: Get labs today before leaving the hospital (on the first floor)  OTHER TESTS: None  MEDICATIONS: Scheduled for IV Venofer x3 doses  FOLLOW-UP APPOINTMENT:  - Phone visit in 2 weeks to discuss results  OTHER INSTRUCTIONS: -Discuss chest pain with cardiologist.  If you have chest pain that does not go away when you stop and rest, immediately call 911 and go to the emergency department. - Continue annual follow-up with primary care provider for breast exam and mammogram   Thank you for choosing [place] at [facility] to provide your oncology and hematology care.  To afford each patient quality time with our provider, please arrive at least 15 minutes before your scheduled appointment time.   If you have a lab appointment with the [facility] please come in thru the Main Entrance and check in at the main information desk.  You need to re-schedule your appointment should you arrive 10 or more minutes late.  We strive to give you quality time with our providers, and arriving late affects you and other patients whose appointments are after yours.  Also, if you no show three or more times for appointments you may be dismissed from the clinic at the providers discretion.     Again, thank you for choosing [facility].  Our hope is that these requests will decrease the amount of time that you wait before being seen by our physicians.       _____________________________________________________________  Should you have questions after your visit to [facility], please contact our office at [phone] and follow the prompts.   Our office hours are 8:00 a.m. and 4:30 p.m. Monday - Friday.  Please note that voicemails left after 4:00 p.m. may not be returned until the following business day.  We are closed weekends and major holidays.  You do have access to a nurse 24-7, just call the main number to the clinic [phone] and do not press any options, hold on the line and a nurse will answer the phone.    For prescription refill requests, have your pharmacy contact our office and allow 72 hours.    Due to Covid, you will need to wear a mask upon entering the hospital. If you do not have a mask, a mask will be given to you at the Main Entrance upon arrival. For doctor visits, patients may have 1 support person age 45 or older with them. For treatment visits, patients can not have anyone with them due to social distancing guidelines and our immunocompromised population.

## 2020-12-20 NOTE — Progress Notes (Signed)
[place], [place] [address]   CLINIC:  Medical Oncology/Hematology  CONSULT NOTE  Patient Care Team: Wendi Maya, NP as PCP - General Abbey Chatters, Elon Alas, DO as Consulting Physician (Internal Medicine)  CHIEF COMPLAINTS/PURPOSE OF CONSULTATION:  Iron deficiency anemia  HISTORY OF PRESENTING ILLNESS:  Heidi [name] 65 y.o. female is here at the request of NP Roseanne Kaufman [facility]) for iron deficiency anemia.  Patient is eager to get her anemia under control, as she was told that she needed to improve her blood counts in order to have rotator cuff surgery.  Patient has had intermittent melena ongoing for the past year.  However, gastroenterology work-up thus far has not found specific source of bleeding.  She had EGD on 05/15/2020 which showed gastritis and one gastric polyp.  Colonoscopy on 08/14/2020 showed nonbleeding internal hemorrhoids, diverticulosis, and one polyp.  Patient was unable to have capsule endoscopy because she has an implanted pacemaker.  Of note, patient was hospitalized in February/March 2022 with acute diverticulitis.  Most recent labs (10/14/2020) show microcytic anemia with MCV 76.4 and hemoglobin 8.8.  Ferritin was 10.2 on 11/06/2020.  She is extremely fatigued, feels "tired all the time," reports that her energy is 0%.  She struggles to complete ADL's due to severe fatigue.  She has intermittent chest pain and dyspnea on exertion.  (She has an appointment with cardiologist next month.)  No syncopal episodes or palpitations.  She admits to melena as above, but epistaxis, hemoptysis, hematochezia, or hematuria.  She does have occasional irregular vaginal bleeding, this is currently being worked up by gynecology.  She has a uterine biopsy scheduled next week.  The patient denies over the counter NSAID ingestion. She is not  on antiplatelets agents.  She denies any unintentional weight  loss, fever, and night sweats. She has shaking chills a few times per week.  She experiences pica and craves ice. She received a blood transfusion in infancy. She does not take iron pills at home due to severe constipation.  Patient complains of cough, SOB, wheezing, and hoarse voice since January 2022. She was encouraged to discuss this with her PCP.  Patient does have a history of stage 0 breast cancer, multicentric DCIS of the right breast, s/p right mastectomy.  Pathology showed no residual disease, ER and PR status could not be assessed.  Patient was on norethindrone for abnormal uterine function at the time of her diagnosis.  She did not receive chemo or radiation; she was not placed on tamoxifen or anastrozole.  Her oncologist discharged her to PCP for annual breast exams and mammograms.  Other PMH includes type 2 diabetes mellitus, hypertension, tachybradycardia syndrome s/p pacemaker, Sjogren's disease, chronic pain syndrome (fibromyalgia and arthritis) on methadone, and other conditions noted elsewhere in medical record.  She is awaiting rotator cuff surgery of both shoulders.  She has chronic back pain and spinal stenosis.  She has sleep apnea, uses CPAP at home.  She lives at home with her husband.  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 She denies tobacco, alcohol, illicit drug use.  Her family history is positive for paternal grandfather with colon cancer, paternal grandmother with ovarian cancer.   MEDICAL HISTORY:  Past Medical History:  Diagnosis Date  . Anemia    takes iron supplement  . Anxiety   . Arthritis   . Asthma    prn neb.  . Cancer ([facility])  right breast cancer  . Depression   . Diabetes mellitus    IDDM  . Diverticulosis   . Environmental allergies   . GERD (gastroesophageal reflux disease)   . Headache(784.0)    migraines  . History of MRSA infection 08/2012   right breast  . Hypertension    under control with  med., has been on med. x 10-15 yr.  . Overactive bladder   . PONV (postoperative nausea and vomiting)    used scop patch 7/14-please use again per pt request  . Sjogren's disease ([facility])   . Sleep apnea    uses CPAP nightly    SURGICAL HISTORY: Past Surgical History:  Procedure Laterality Date  . ANKLE ARTHROSCOPY Left   . BIOPSY  05/15/2020   Procedure: BIOPSY;  Surgeon: Eloise Harman, DO;  [facility];  Service: Endoscopy;;  gastric   . BREAST RECONSTRUCTION  05/24/2012   Procedure: BREAST RECONSTRUCTION;  Surgeon: Cristine Polio, MD;  Location: [place];  Service: Plastics;  Laterality: Right;  NO FLEX HD  . BREAST RECONSTRUCTION Right 01/17/2013   Procedure: RIGHT BREAST RECONSTRUCTION WITH PLACEMENT OF TISSUE EXPANDER ;  Surgeon: Cristine Polio, MD;  Location: [place];  Service: Plastics;  Laterality: Right;  . BREAST REDUCTION SURGERY Left 02/28/2013   Procedure: LEFT BREAST REDUCTION  (BREAST);  Surgeon: Cristine Polio, MD;  Location: [place];  Service: Plastics;  Laterality: Left;  . CESAREAN SECTION    . CHOLECYSTECTOMY    . COLONOSCOPY WITH PROPOFOL N/A 08/14/2020   non-bleeding internal hemorrhoids, multiple small mouth diverticula, 5 mm polyp in descending colon, fair prep. Tubular adenoma. Surveillance in 5 years.   . ESOPHAGOGASTRODUODENOSCOPY (EGD) WITH PROPOFOL N/A 05/15/2020   gastritis, reactive gastropathy. negative H.pylori. one gastric polyp. normal duodenum  . INSERT / REPLACE / REMOVE PACEMAKER    . MASTECTOMY Right 03/2012  . POLYPECTOMY  05/15/2020   Procedure: POLYPECTOMY;  Surgeon: Eloise Harman, DO;  [facility];  Service: Endoscopy;;  antral  . POLYPECTOMY  08/14/2020   Procedure: POLYPECTOMY;  Surgeon: Eloise Harman, DO;  [facility];  Service: Endoscopy;;  . REMOVAL OF TISSUE EXPANDER AND PLACEMENT OF IMPLANT Right 05/10/2013   Procedure: RIGHT REMOVAL OF  TISSUE EXPANDER ;  Surgeon: Cristine Polio, MD;  Location: [place];  Service: Plastics;  Laterality: Right;  . SHOULDER ARTHROSCOPY Right   . TISSUE EXPANDER PLACEMENT  05/24/2012   Procedure: TISSUE EXPANDER;  Surgeon: Cristine Polio, MD;  Location: [place];  Service: Plastics;  Laterality: Right;    SOCIAL HISTORY: Social History   Socioeconomic History  . Marital status: Married    Spouse name: Not on file  . Number of children: Not on file  . Years of education: Not on file  . Highest education level: Not on file  Occupational History  . Not on file  Tobacco Use  . Smoking status: Never Smoker  . Smokeless tobacco: Never Used  Vaping Use  . Vaping Use: Never used  Substance and Sexual Activity  . Alcohol use: No  . Drug use: No  . Sexual activity: Not Currently  Other Topics Concern  . Not on file  Social History Narrative  . Not on file   Social Determinants of Health   Financial Resource Strain: Not on file  Food Insecurity: No Food Insecurity  . Worried About Charity fundraiser in the Last Year:  Never true  . Ran Out of Food in the Last Year: Never true  Transportation Needs: No Transportation Needs  . Lack of Transportation (Medical): No  . Lack of Transportation (Non-Medical): No  Physical Activity: Inactive  . Days of Exercise per Week: 0 days  . Minutes of Exercise per Session: 0 min  Stress: No Stress Concern Present  . Feeling of Stress : Only a little  Social Connections: Not on file  Intimate Partner Violence: Not At Risk  . Fear of Current or Ex-Partner: No  . Emotionally Abused: No  . Physically Abused: No  . Sexually Abused: No    FAMILY HISTORY: Family History  Problem Relation Age of Onset  . Colon cancer Paternal Grandmother   . Colon cancer Paternal Grandfather     ALLERGIES:  is allergic to soap, allevyn adhesive [wound dressings], aripiprazole, ketorolac tromethamine, mixed grasses, other,  pollen extract, prednisone, requip [ropinirole], risperidone, sulfamethoxazole-trimethoprim, vraylar [cariprazine], latex, and sulfa antibiotics.  MEDICATIONS:  Current Outpatient Medications  Medication Sig Dispense Refill  . allopurinol (ZYLOPRIM) 100 MG tablet Take 100 mg by mouth daily.    Marland Kitchen allopurinol (ZYLOPRIM) 100 MG tablet 1/2 daily per rhematology    . amoxicillin-clavulanate (AUGMENTIN) 875-125 MG tablet TAKE 1 TABLET BY MOUTH EVERY TWELVE HOURS FOR 10 DAYS. 20 tablet 0  . benazepril-hydrochlorthiazide (LOTENSIN HCT) 20-25 MG tablet Take 1 tablet by mouth daily.    . benztropine (COGENTIN) 0.5 MG tablet Take 0.5 mg by mouth 2 (two) times daily.    . clonazePAM (KLONOPIN) 0.5 MG tablet Take 0.5 mg by mouth at bedtime as needed for anxiety (sleep).    . colchicine 0.6 MG tablet Take 0.6 mg by mouth daily as needed (gout flare).    . cyanocobalamin (,VITAMIN B-12,) 1000 MCG/ML injection See admin instructions.    . cyclobenzaprine (FLEXERIL) 5 MG tablet 1 tab(s)    . DULoxetine (CYMBALTA) 60 MG capsule Take 60 mg by mouth 2 (two) times daily.    . folic acid (FOLVITE) 1 MG tablet Take 1 mg by mouth daily.    . folic acid (FOLVITE) 1 MG tablet Take by mouth.    . furosemide (LASIX) 20 MG tablet Take 20 mg by mouth daily.    . hydroxychloroquine (PLAQUENIL) 200 MG tablet Take 400 mg by mouth daily.    Marland Kitchen lamoTRIgine (LAMICTAL) 25 MG tablet Take 25 mg by mouth 2 (two) times daily.    Marland Kitchen lidocaine (LIDODERM) 5 % Place 1 patch onto the skin daily as needed for pain.    . metFORMIN (GLUCOPHAGE) 1000 MG tablet Take 1,000 mg by mouth 2 (two) times daily with a meal.    . methadone (DOLOPHINE) 5 MG tablet Take 5 mg by mouth 2 (two) times daily.    . methotrexate (RHEUMATREX) 5 MG tablet 4 tabs    . methotrexate 2.5 MG tablet Take 4 tablets by mouth every Friday.    . Misc. Devices (ROLLATOR ULTRA-LIGHT) MISC See admin instructions.    . modafinil (PROVIGIL) 200 MG tablet Take 200 mg by mouth  daily.    Melynda Ripple Tosylate (SYMPROIC) 0.2 MG TABS Take 1 tablet by mouth daily. With or without food 30 tablet 5  . NYSTATIN powder Apply 2-3 application topically daily.    Marland Kitchen omeprazole (PRILOSEC) 40 MG capsule Take 1 capsule (40 mg total) by mouth in the morning and at bedtime. 90 capsule 3  . oxybutynin (DITROPAN) 5 MG tablet Take 5 mg by mouth 3 (  three) times daily.    Marland Kitchen oxybutynin (DITROPAN) 5 MG tablet 1 tab(s)    . pantoprazole (PROTONIX) 40 MG tablet 2 tabs    . prednisoLONE acetate (PRED FORTE) 1 % ophthalmic suspension Place 1 drop into both eyes daily as needed (dry eyes).    . pregabalin (LYRICA) 100 MG capsule Take 100 mg by mouth 2 (two) times daily.    . promethazine-dextromethorphan (PROMETHAZINE-DM) 6.25-15 MG/5ML syrup 5 mL    . propranolol (INDERAL) 10 MG tablet Take 10 mg by mouth 2 (two) times daily.     No current facility-administered medications for this visit.    REVIEW OF SYSTEMS:   Review of Systems  Constitutional: Positive for chills and fatigue. Negative for appetite change, diaphoresis, fever and unexpected weight change.  HENT:   Positive for voice change. Negative for lump/mass and nosebleeds.   Eyes: Negative for eye problems.  Respiratory: Positive for cough and shortness of breath. Negative for hemoptysis.   Cardiovascular: Positive for chest pain and leg swelling (chronic). Negative for palpitations.  Gastrointestinal: Positive for blood in stool and diarrhea. Negative for abdominal pain, constipation, nausea and vomiting.  Genitourinary: Positive for vaginal bleeding. Negative for hematuria.   Musculoskeletal: Positive for arthralgias and back pain.  Skin: Negative.   Neurological: Negative for dizziness, headaches and light-headedness.  Hematological: Does not bruise/bleed easily.  Psychiatric/Behavioral: Positive for sleep disturbance.      PHYSICAL EXAMINATION: ECOG PERFORMANCE STATUS: 2 - Symptomatic, <50% confined to bed  Vitals:    12/20/20 0856  BP: (!) 121/54  Pulse: 88  Resp: 18  Temp: 97.8 F (36.6 C)  SpO2: 100%   Filed Weights   12/20/20 0856  Weight: 274 lb 11.1 oz (124.6 kg)    Physical Exam Constitutional:      Appearance: Normal appearance. She is obese.  HENT:     Head: Normocephalic and atraumatic.     Mouth/Throat:     Mouth: Mucous membranes are moist.  Eyes:     Extraocular Movements: Extraocular movements intact.     Pupils: Pupils are equal, round, and reactive to light.  Cardiovascular:     Rate and Rhythm: Normal rate and regular rhythm.     Pulses: Normal pulses.     Heart sounds: Normal heart sounds.  Pulmonary:     Effort: Pulmonary effort is normal.     Breath sounds: Normal breath sounds.  Abdominal:     General: Bowel sounds are normal.     Palpations: Abdomen is soft.     Tenderness: There is no abdominal tenderness.  Musculoskeletal:        General: No swelling.     Right lower leg: Edema (1+) present.     Left lower leg: Edema (1+) present.  Lymphadenopathy:     Cervical: No cervical adenopathy.  Skin:    General: Skin is warm and dry.  Neurological:     General: No focal deficit present.     Mental Status: She is alert and oriented to person, place, and time.  Psychiatric:        Mood and Affect: Mood normal.        Behavior: Behavior normal.       LABORATORY DATA:  I have reviewed the data as listed Recent Results (from the past 2160 hour(s))  Lactic acid, plasma     Status: Abnormal   Collection Time: 10/13/20  1:43 AM  Result Value Ref Range   Lactic Acid, Venous 3.3 (HH) 0.5 -  1.9 mmol/L    Comment: CRITICAL VALUE NOTED.  VALUE IS CONSISTENT WITH PREVIOUSLY REPORTED AND CALLED VALUE. Performed at [facility], [place] [address], [place], [place] [address]   CBC with Differential     Status: Abnormal   Collection Time: 10/13/20  8:41 PM  Result Value Ref Range   WBC 8.2 4.0 - 10.5 K/uL   RBC 3.75 (L) 3.87 - 5.11 MIL/uL   Hemoglobin 9.2 (L)  12.0 - 15.0 g/dL   HCT 29.5 (L) 36.0 - 46.0 %   MCV 78.7 (L) 80.0 - 100.0 fL   MCH 24.5 (L) 26.0 - 34.0 pg   MCHC 31.2 30.0 - 36.0 g/dL   RDW 17.3 (H) 11.5 - 15.5 %   Platelets 226 150 - 400 K/uL   nRBC 0.0 0.0 - 0.2 %   Neutrophils Relative % 77 %   Neutro Abs 6.4 1.7 - 7.7 K/uL   Lymphocytes Relative 10 %   Lymphs Abs 0.8 0.7 - 4.0 K/uL   Monocytes Relative 10 %   Monocytes Absolute 0.8 0.1 - 1.0 K/uL   Eosinophils Relative 1 %   Eosinophils Absolute 0.1 0.0 - 0.5 K/uL   Basophils Relative 1 %   Basophils Absolute 0.1 0.0 - 0.1 K/uL   Immature Granulocytes 1 %   Abs Immature Granulocytes 0.04 0.00 - 0.07 K/uL    Comment: Performed at [facility], [place] [address] [place], [place] [address]  Comprehensive metabolic panel     Status: Abnormal   Collection Time: 10/13/20  8:41 PM  Result Value Ref Range   Sodium 131 (L) 135 - 145 mmol/L   Potassium 3.0 (L) 3.5 - 5.1 mmol/L   Chloride 97 (L) 98 - 111 mmol/L   CO2 17 (L) 22 - 32 mmol/L   Glucose, Bld 181 (H) 70 - 99 mg/dL    Comment: Glucose reference range applies only to samples taken after fasting for at least 8 hours.   BUN 23 8 - 23 mg/dL   Creatinine, Ser 1.44 (H) 0.44 - 1.00 mg/dL   Calcium 8.7 (L) 8.9 - 10.3 mg/dL   Total Protein 6.2 (L) 6.5 - 8.1 g/dL   Albumin 3.5 3.5 - 5.0 g/dL   AST 18 15 - 41 U/L   ALT 11 0 - 44 U/L   Alkaline Phosphatase 87 38 - 126 U/L   Total Bilirubin 0.5 0.3 - 1.2 mg/dL   GFR, Estimated 41 (L) >60 mL/min    Comment: (NOTE) Calculated using the CKD-EPI Creatinine Equation (2021)    Anion gap 17 (H) 5 - 15    Comment: Performed at [facility], [place] [address], [place], [place] [address]  Troponin I (High Sensitivity)     Status: None   Collection Time: 10/13/20  8:41 PM  Result Value Ref Range   Troponin I (High Sensitivity) 5 <18 ng/L    Comment: (NOTE) Elevated high sensitivity troponin I (hsTnI) values and significant  changes across serial measurements may suggest ACS  but many other  chronic and acute conditions are known to elevate hsTnI results.  Refer to the "Links" section for chest pain algorithms and additional  guidance. Performed at [facility], [place] [address] [place],  [address]   Resp Panel by RT-PCR (Flu A&B, Covid) Nasopharyngeal Swab     Status: None   Collection Time: 10/13/20  9:06 PM   Specimen: Nasopharyngeal Swab; Nasopharyngeal(NP) swabs in vial transport medium  Result Value Ref Range   SARS Coronavirus 2 by RT PCR NEGATIVE NEGATIVE    Comment: (NOTE) SARS-CoV-2 target nucleic acids are NOT DETECTED.  The SARS-CoV-2 RNA is generally detectable in upper respiratory specimens during the acute phase of infection. The lowest concentration of SARS-CoV-2 viral copies this assay can detect is 138 copies/mL. A negative result does not preclude SARS-Cov-2 infection and should not be used as the sole basis for treatment or other patient management decisions. A negative result may occur with  improper specimen collection/handling, submission of specimen other than nasopharyngeal swab, presence of viral mutation(s) within the areas targeted by this assay, and inadequate number of viral copies(<138 copies/mL). A negative result must be combined with clinical observations, patient history, and epidemiological information. The expected result is Negative.  Fact Sheet for Patients:  EntrepreneurPulse.com.au  Fact Sheet for Healthcare Providers:  IncredibleEmployment.be  This test is no t yet approved or cleared by the [place] FDA and  has been authorized for detection and/or diagnosis of SARS-CoV-2 by FDA under an Emergency Use Authorization (EUA). This EUA will remain  in effect (meaning this test can be used) for the duration of the COVID-19 declaration under Section 564(b)(1) of the Act, 21 U.S.C.section 360bbb-3(b)(1), unless the authorization is terminated  or revoked sooner.        Influenza A by PCR NEGATIVE NEGATIVE   Influenza B by PCR NEGATIVE NEGATIVE    Comment: (NOTE) The Xpert Xpress SARS-CoV-2/FLU/RSV plus assay is intended as an aid in the diagnosis of influenza from Nasopharyngeal swab specimens and should not be used as a sole basis for treatment. Nasal washings and aspirates are unacceptable for Xpert Xpress SARS-CoV-2/FLU/RSV testing.  Fact Sheet for Patients: EntrepreneurPulse.com.au  Fact Sheet for Healthcare Providers: IncredibleEmployment.be  This test is not yet approved or cleared by the [place] FDA and has been authorized for detection and/or diagnosis of SARS-CoV-2 by FDA under an Emergency Use Authorization (EUA). This EUA will remain in effect (meaning this test can be used) for the duration of the COVID-19 declaration under Section 564(b)(1) of the Act, 21 U.S.C. section 360bbb-3(b)(1), unless the authorization is terminated or revoked.  Performed at [facility], [place] [address] [place], [place] [address]   Lactic acid, plasma     Status: Abnormal   Collection Time: 10/13/20  9:45 PM  Result Value Ref Range   Lactic Acid, Venous 5.2 (HH) 0.5 - 1.9 mmol/L    Comment: CRITICAL RESULT CALLED TO, READ BACK BY AND VERIFIED WITH: STRAUGHAN C,RN 10/13/20 2228 WAYK Performed at [facility], [place] [address], [place], [place] [address]   Troponin I (High Sensitivity)     Status: None   Collection Time: 10/14/20  1:43 AM  Result Value Ref Range   Troponin I (High Sensitivity) 4 <18 ng/L    Comment: (NOTE) Elevated high sensitivity troponin I (hsTnI) values and significant  changes across serial measurements may suggest ACS but many other  chronic and acute conditions are known to elevate hsTnI results.  Refer to the "Links" section for chest pain algorithms and additional  guidance. Performed at [facility], [place] [address], [place],  [address]   Blood  culture (routine x 2)     Status: None   Collection Time: 10/14/20  3:00 AM   Specimen: BLOOD  Result Value Ref Range   Specimen Description BLOOD LEFT ANTECUBITAL    Special Requests      BOTTLES DRAWN AEROBIC  AND ANAEROBIC Blood Culture adequate volume   Culture      NO GROWTH 5 DAYS Performed at [facility], [place] [address], [place], [place] [address]    Report Status 10/19/2020 FINAL   Blood culture (routine x 2)     Status: None   Collection Time: 10/14/20  3:11 AM   Specimen: BLOOD LEFT HAND  Result Value Ref Range   Specimen Description BLOOD LEFT HAND    Special Requests      BOTTLES DRAWN AEROBIC AND ANAEROBIC Blood Culture results may not be optimal due to an inadequate volume of blood received in culture bottles   Culture      NO GROWTH 5 DAYS Performed at [facility], [place] [address], [place], [place] [address]    Report Status 10/19/2020 FINAL   Lactic acid, plasma     Status: Abnormal   Collection Time: 10/14/20  5:33 AM  Result Value Ref Range   Lactic Acid, Venous 2.6 (HH) 0.5 - 1.9 mmol/L    Comment: CRITICAL VALUE NOTED.  VALUE IS CONSISTENT WITH PREVIOUSLY REPORTED AND CALLED VALUE. Performed at [facility], [place] [address] [place],  [address]   CBC with Differential/Platelet     Status: Abnormal   Collection Time: 10/14/20  5:33 AM  Result Value Ref Range   WBC 5.3 4.0 - 10.5 K/uL   RBC 3.68 (L) 3.87 - 5.11 MIL/uL   Hemoglobin 8.8 (L) 12.0 - 15.0 g/dL    Comment: Reticulocyte Hemoglobin testing may be clinically indicated, consider ordering this additional test [id number]    HCT 28.1 (L) 36.0 - 46.0 %   MCV 76.4 (L) 80.0 - 100.0 fL   MCH 23.9 (L) 26.0 - 34.0 pg   MCHC 31.3 30.0 - 36.0 g/dL   RDW 17.2 (H) 11.5 - 15.5 %   Platelets 183 150 - 400 K/uL   nRBC 0.0 0.0 - 0.2 %   Neutrophils Relative % 63 %   Neutro Abs 3.3 1.7 - 7.7 K/uL   Lymphocytes Relative 24 %   Lymphs Abs 1.3 0.7 - 4.0 K/uL   Monocytes Relative 11 %    Monocytes Absolute 0.6 0.1 - 1.0 K/uL   Eosinophils Relative 1 %   Eosinophils Absolute 0.1 0.0 - 0.5 K/uL   Basophils Relative 1 %   Basophils Absolute 0.0 0.0 - 0.1 K/uL   Immature Granulocytes 0 %   Abs Immature Granulocytes 0.02 0.00 - 0.07 K/uL    Comment: Performed at [facility], [address] [place],  [address]  Comprehensive metabolic panel     Status: Abnormal   Collection Time: 10/14/20  5:33 AM  Result Value Ref Range   Sodium 134 (L) 135 - 145 mmol/L   Potassium 3.5 3.5 - 5.1 mmol/L   Chloride 101 98 - 111 mmol/L   CO2 22 22 - 32 mmol/L   Glucose, Bld 122 (H) 70 - 99 mg/dL    Comment: Glucose reference range applies only to samples taken after fasting for at least 8 hours.   BUN 22 8 - 23 mg/dL   Creatinine, Ser 1.19 (H) 0.44 - 1.00 mg/dL   Calcium 8.4 (L) 8.9 - 10.3 mg/dL   Total Protein 5.1 (L) 6.5 - 8.1 g/dL   Albumin 3.0 (L) 3.5 - 5.0 g/dL   AST 16 15 - 41 U/L   ALT 11 0 - 44 U/L   Alkaline Phosphatase 67 38 - 126  U/L   Total Bilirubin 0.7 0.3 - 1.2 mg/dL   GFR, Estimated 51 (L) >60 mL/min    Comment: (NOTE) Calculated using the CKD-EPI Creatinine Equation (2021)    Anion gap 11 5 - 15    Comment: Performed at [place] [address] [place], [place] [address]  Hemoglobin A1c     Status: Abnormal   Collection Time: 10/14/20  5:33 AM  Result Value Ref Range   Hgb A1c MFr Bld 6.0 (H) 4.8 - 5.6 %    Comment: (NOTE) Pre diabetes:          5.7%-6.4%  Diabetes:              >6.4%  Glycemic control for   <7.0% adults with diabetes    Mean Plasma Glucose 125.5 mg/dL    Comment: Performed at [place] [address] [place], [place] [address]  Magnesium     Status: Abnormal   Collection Time: 10/14/20  5:33 AM  Result Value Ref Range   Magnesium 1.6 (L) 1.7 - 2.4 mg/dL    Comment: Performed at [place] [address] [place], [place] [address]  Glucose, capillary     Status: None   Collection Time:  10/14/20  6:51 AM  Result Value Ref Range   Glucose-Capillary 94 70 - 99 mg/dL    Comment: Glucose reference range applies only to samples taken after fasting for at least 8 hours.   Comment 1 Notify RN    Comment 2 Document in Chart   Lactic acid, plasma     Status: Abnormal   Collection Time: 10/14/20  7:22 AM  Result Value Ref Range   Lactic Acid, Venous 2.0 (HH) 0.5 - 1.9 mmol/L    Comment: CRITICAL VALUE NOTED.  VALUE IS CONSISTENT WITH PREVIOUSLY REPORTED AND CALLED VALUE. Performed at [facility], [place] [address] [place], [place] [address]   Glucose, capillary     Status: Abnormal   Collection Time: 10/14/20 12:30 PM  Result Value Ref Range   Glucose-Capillary 136 (H) 70 - 99 mg/dL    Comment: Glucose reference range applies only to samples taken after fasting for at least 8 hours.  Glucose, capillary     Status: Abnormal   Collection Time: 10/14/20  4:43 PM  Result Value Ref Range   Glucose-Capillary 184 (H) 70 - 99 mg/dL    Comment: Glucose reference range applies only to samples taken after fasting for at least 8 hours.  Glucose, capillary     Status: Abnormal   Collection Time: 10/14/20  9:19 PM  Result Value Ref Range   Glucose-Capillary 110 (H) 70 - 99 mg/dL    Comment: Glucose reference range applies only to samples taken after fasting for at least 8 hours.  Glucose, capillary     Status: Abnormal   Collection Time: 10/15/20  6:06 AM  Result Value Ref Range   Glucose-Capillary 111 (H) 70 - 99 mg/dL    Comment: Glucose reference range applies only to samples taken after fasting for at least 8 hours.  Basic metabolic panel     Status: Abnormal   Collection Time: 10/15/20 10:19 AM  Result Value Ref Range   Sodium 136 135 - 145 mmol/L   Potassium 3.7 3.5 - 5.1 mmol/L   Chloride 102 98 - 111 mmol/L   CO2 21 (L) 22 - 32 mmol/L   Glucose, Bld 113 (H) 70 - 99 mg/dL  Comment: Glucose reference range applies only to samples taken after fasting for at least 8  hours.   BUN 8 8 - 23 mg/dL   Creatinine, Ser 0.88 0.44 - 1.00 mg/dL   Calcium 8.6 (L) 8.9 - 10.3 mg/dL   GFR, Estimated >60 >60 mL/min    Comment: (NOTE) Calculated using the CKD-EPI Creatinine Equation (2021)    Anion gap 13 5 - 15    Comment: Performed at [facility] [address], [place], [place] [address]  Glucose, capillary     Status: Abnormal   Collection Time: 10/15/20 11:22 AM  Result Value Ref Range   Glucose-Capillary 108 (H) 70 - 99 mg/dL    Comment: Glucose reference range applies only to samples taken after fasting for at least 8 hours.  Glucose, capillary     Status: None   Collection Time: 10/15/20  4:48 PM  Result Value Ref Range   Glucose-Capillary 95 70 - 99 mg/dL    Comment: Glucose reference range applies only to samples taken after fasting for at least 8 hours.  Glucose, capillary     Status: Abnormal   Collection Time: 10/15/20  9:53 PM  Result Value Ref Range   Glucose-Capillary 113 (H) 70 - 99 mg/dL    Comment: Glucose reference range applies only to samples taken after fasting for at least 8 hours.  Glucose, capillary     Status: Abnormal   Collection Time: 10/16/20  6:05 AM  Result Value Ref Range   Glucose-Capillary 114 (H) 70 - 99 mg/dL    Comment: Glucose reference range applies only to samples taken after fasting for at least 8 hours.  Vitamin B12     Status: None   Collection Time: 12/20/20 10:44 AM  Result Value Ref Range   Vitamin B-12 365 180 - 914 pg/mL    Comment: (NOTE) This assay is not validated for testing neonatal or myeloproliferative syndrome specimens for Vitamin B12 levels. Performed at [facility], [address] [place], [place] [address]   VITAMIN D 25 Hydroxy (Vit-D Deficiency, Fractures)     Status: Abnormal   Collection Time: 12/20/20 10:44 AM  Result Value Ref Range   Vit D, 25-Hydroxy 20.74 (L) 30 - 100 ng/mL    Comment: (NOTE) Vitamin D deficiency has been defined by the [place] practice guideline as a level of serum 25-OH  vitamin D less than 20 ng/mL (1,2). The Endocrine Society went on to  further define vitamin D insufficiency as a level between 21 and 29  ng/mL (2).  1. IOM (Institute of Medicine). 2010. Dietary reference intakes for  calcium and D. [place]: [employer]. 2. Holick MF, [place], Bischoff-Ferrari HA, et al. Evaluation,  treatment, and prevention of vitamin D deficiency: an Endocrine  Society clinical practice guideline, JCEM. 2011 Jul; 96(7): 1911-30.  Performed at [facility], [place] [address] [place], [place] [address]   Folate     Status: None   Collection Time: 12/20/20 10:44 AM  Result Value Ref Range   Folate 40.9 >5.9 ng/mL    Comment: RESULTS CONFIRMED BY MANUAL DILUTION Performed at [facility], [address], [place], [place] [address]   CBC with Differential/Platelet     Status: Abnormal   Collection Time: 12/20/20 10:44 AM  Result Value Ref Range   WBC 5.8 4.0 - 10.5 K/uL   RBC 3.69 (L) 3.87 - 5.11 MIL/uL  Hemoglobin 9.0 (L) 12.0 - 15.0 g/dL   HCT 35.3 (L) 61.4 - 43.1 %   MCV 83.2 80.0 - 100.0 fL   MCH 24.4 (L) 26.0 - 34.0 pg   MCHC 29.3 (L) 30.0 - 36.0 g/dL   RDW 54.0 (H) 08.6 - 76.1 %   Platelets 257 150 - 400 K/uL   nRBC 0.0 0.0 - 0.2 %   Neutrophils Relative % 67 %   Neutro Abs 3.9 1.7 - 7.7 K/uL   Lymphocytes Relative 22 %   Lymphs Abs 1.3 0.7 - 4.0 K/uL   Monocytes Relative 7 %   Monocytes Absolute 0.4 0.1 - 1.0 K/uL   Eosinophils Relative 2 %   Eosinophils Absolute 0.1 0.0 - 0.5 K/uL   Basophils Relative 1 %   Basophils Absolute 0.0 0.0 - 0.1 K/uL   Immature Granulocytes 1 %   Abs Immature Granulocytes 0.04 0.00 - 0.07 K/uL    Comment: Performed at [facility], [address] [place], [place] [address]  Comprehensive metabolic panel     Status: Abnormal   Collection Time: 12/20/20 10:44 AM  Result Value Ref Range   Sodium 136 135 - 145 mmol/L    Potassium 4.2 3.5 - 5.1 mmol/L   Chloride 102 98 - 111 mmol/L   CO2 28 22 - 32 mmol/L   Glucose, Bld 121 (H) 70 - 99 mg/dL    Comment: Glucose reference range applies only to samples taken after fasting for at least 8 hours.   BUN 13 8 - 23 mg/dL   Creatinine, Ser 2.67 0.44 - 1.00 mg/dL   Calcium 9.0 8.9 - 12.4 mg/dL   Total Protein 6.8 6.5 - 8.1 g/dL   Albumin 3.6 3.5 - 5.0 g/dL   AST 16 15 - 41 U/L   ALT 12 0 - 44 U/L   Alkaline Phosphatase 89 38 - 126 U/L   Total Bilirubin 0.6 0.3 - 1.2 mg/dL   GFR, Estimated >58 >09 mL/min    Comment: (NOTE) Calculated using the CKD-EPI Creatinine Equation (2021)    Anion gap 6 5 - 15    Comment: Performed at [facility], [address], [place], [place] [address]  Ferritin     Status: Abnormal   Collection Time: 12/20/20 10:44 AM  Result Value Ref Range   Ferritin 10 (L) 11 - 307 ng/mL    Comment: Performed at [facility], [address], [place], [place] [address]  Iron and TIBC     Status: Abnormal   Collection Time: 12/20/20 10:44 AM  Result Value Ref Range   Iron 23 (L) 28 - 170 ug/dL   TIBC 976 734 - 193 ug/dL   Saturation Ratios 5 (L) 10.4 - 31.8 %   UIBC 407 ug/dL    Comment: Performed at [facility], [address] [place], [place] [address]  Lactate dehydrogenase     Status: None   Collection Time: 12/20/20 10:44 AM  Result Value Ref Range   LDH 136 98 - 192 U/L    Comment: Performed at [facility], [address] [place], [place] [address]  Reticulocytes     Status: Abnormal   Collection Time: 12/20/20 10:44 AM  Result Value Ref Range   Retic Ct Pct 2.2 0.4 - 3.1 %   RBC. 3.64 (L) 3.87 - 5.11 MIL/uL   Retic Count, Absolute 81.5 19.0 - 186.0 K/uL   Immature Retic Fract 29.8 (H) 2.3 - 15.9 %    Comment: Performed at  [facility] [place], [place] [address]    RADIOGRAPHIC STUDIES: I have personally reviewed the radiological images as listed and agreed with the findings in the report. No results  found.  ASSESSMENT: 1.  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  However, gastroenterology work-up thus far has not found specific source of bleeding. -EGD on 05/15/2020 which showed gastritis and one gastric polyp. -Colonoscopy on 08/14/2020 showed nonbleeding internal hemorrhoids, diverticulosis, and one polyp. -Patient is unable to have capsule endoscopy due to implanted pacemaker.   -Most recent labs (10/14/2020) show microcytic anemia with MCV 76.4and hemoglobin 8.8.    Ferritin was 10.2 on 11/06/2020. -Unable to tolerate iron pill due to severe constipation --Symptomatic with severe fatigue and dyspnea on exertion and pica  2. History of right breast DCIS -- Patient does have a history of stage 0 breast cancer, multicentric DCIS of the right breast, s/p right mastectomy.  Pathology showed no residual disease, ER and PR status could not be assessed. Patient was on norethindrone for abnormal uterine function at the time of her diagnosis.  She did not receive chemo or radiation; she was not placed on tamoxifen or anastrozole.  Her oncologist discharged her to PCP for annual breast exams and mammograms. --Most recent mammogram in March 2022 reportedly normal  3.  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  She is awaiting rotator cuff surgery of both shoulders.  She has chronic back pain and spinal stenosis.  She has sleep apnea, uses CPAP at home. -Social history: She lives at home with her husband.  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 She denies tobacco, alcohol, illicit drug use. -Family history:  Her family history is positive for paternal grandfather with  colon cancer, paternal grandmother with ovarian cancer.   PLAN:  1.  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  History of right breast DCIS --Continue annual mammogram and breast exam with PCP  3. Respiratory symptoms --Patient complains of cough, SOB, wheezing, and hoarse voice since January 2022. She was encouraged to discuss this with her PCP.  4. Chest pain --No chest pain during today's visit --Advised to discuss chest pain with cardiologist at upcoming appointment --Advised to present to ED with chest pain that does not resolve with rest or with any alarm symptoms   PLAN SUMMARY & DISPOSITION: - We will schedule for IV Venofer (300 mg-300 mg / 400 mg) - Labs today - Phone visit in 2 weeks to discuss results (schedule repeat anemia panel and follow-up 3 months after phone visit) - Follow-up with PCP and other specialists as above  All questions were answered. The patient knows to call the clinic with any problems, questions or concerns.   Medical decision making: Moderate (1 new problem under work-up with uncertain prognosis, review of external notes, review of prior labs, ordering new tests)  Time spent on visit: I spent 40 minutes counseling the patient face to face. The total time spent in the appointment was 55 minutes and more than 50% was on counseling.  I, [name], have seen this patient in conjunction with Dr. Derek Jack. Greater than 50% of visit was performed by Dr. Delton Coombes.  Addendum: I have evaluated this patient  face-to-face and agree with HPI written by Tarri Abernethy, PA-C.  She has severe iron deficiency anemia from chronic GI blood loss.  We talked about checking her for other nutritional deficiencies and scheduling her for parenteral iron therapy.  We discussed the side effects of parenteral iron therapy including rare chance of anaphylactic reaction.  We will likely schedule her follow-up visit in 3 months.     Derek Jack, MD 12/20/20 7:21 PM

## 2020-12-21 ENCOUNTER — Inpatient Hospital Stay (HOSPITAL_COMMUNITY): Payer: Medicare PPO

## 2020-12-21 VITALS — BP 103/55 | HR 62 | Temp 97.2°F | Resp 18

## 2020-12-21 DIAGNOSIS — D500 Iron deficiency anemia secondary to blood loss (chronic): Secondary | ICD-10-CM | POA: Diagnosis not present

## 2020-12-21 LAB — ERYTHROPOIETIN: Erythropoietin: 119.4 m[IU]/mL — ABNORMAL HIGH (ref 2.6–18.5)

## 2020-12-21 LAB — KAPPA/LAMBDA LIGHT CHAINS
Kappa free light chain: 31.1 mg/L — ABNORMAL HIGH (ref 3.3–19.4)
Kappa, lambda light chain ratio: 1.43 (ref 0.26–1.65)
Lambda free light chains: 21.7 mg/L (ref 5.7–26.3)

## 2020-12-21 MED ORDER — FAMOTIDINE 20 MG PO TABS
20.0000 mg | ORAL_TABLET | Freq: Once | ORAL | Status: AC
Start: 2020-12-21 — End: 2020-12-21
  Administered 2020-12-21: 20 mg via ORAL

## 2020-12-21 MED ORDER — SODIUM CHLORIDE 0.9 % IV SOLN
Freq: Once | INTRAVENOUS | Status: AC
Start: 2020-12-21 — End: 2020-12-21

## 2020-12-21 MED ORDER — LORATADINE 10 MG PO TABS
ORAL_TABLET | ORAL | Status: AC
  Filled 2020-12-21: qty 1

## 2020-12-21 MED ORDER — ACETAMINOPHEN 325 MG PO TABS
ORAL_TABLET | ORAL | Status: AC
  Filled 2020-12-21: qty 2

## 2020-12-21 MED ORDER — LORATADINE 10 MG PO TABS
10.0000 mg | ORAL_TABLET | Freq: Once | ORAL | Status: AC
  Administered 2020-12-21: 10 mg via ORAL

## 2020-12-21 MED ORDER — ACETAMINOPHEN 325 MG PO TABS
650.0000 mg | ORAL_TABLET | Freq: Once | ORAL | Status: AC
  Administered 2020-12-21: 650 mg via ORAL

## 2020-12-21 MED ORDER — FAMOTIDINE 20 MG PO TABS
ORAL_TABLET | ORAL | Status: AC
  Filled 2020-12-21: qty 1

## 2020-12-21 MED ORDER — SODIUM CHLORIDE 0.9 % IV SOLN
300.0000 mg | Freq: Once | INTRAVENOUS | Status: AC
  Administered 2020-12-21: 300 mg via INTRAVENOUS
  Filled 2020-12-21: qty 300

## 2020-12-21 NOTE — Progress Notes (Signed)
Patient presents today for Venofer infusion.  Vital signs WNL.  Patient has no new complaints since last visit.  Peripheral IV started and blood return noted pre and post infusion.  Venofer infusion given today per MD orders.  Stable during infusion without adverse affects.  Vital signs stable.  No complaints at this time.  Discharge from clinic ambulatory in stable condition.  Alert and oriented X 3.  Follow up with [facility] as scheduled. 

## 2020-12-21 NOTE — Patient Instructions (Signed)
[facility]  Discharge Instructions: Thank you for choosing [facility] to provide your oncology and hematology care.  If you have a lab appointment with the Cancer Center, please come in thru the Main Entrance and check in at the main information desk.  Wear comfortable clothing and clothing appropriate for easy access to any Portacath or PICC line.   We strive to give you quality time with your provider. You may need to reschedule your appointment if you arrive late (15 or more minutes).  Arriving late affects you and other patients whose appointments are after yours.  Also, if you miss three or more appointments without notifying the office, you may be dismissed from the clinic at the provider's discretion.      For prescription refill requests, have your pharmacy contact our office and allow 72 hours for refills to be completed.    Today you received the following Venofer infusion     To help prevent nausea and vomiting after your treatment, we encourage you to take your nausea medication as directed.  BELOW ARE SYMPTOMS THAT SHOULD BE REPORTED IMMEDIATELY: . *FEVER GREATER THAN 100.4 F (38 C) OR HIGHER . *CHILLS OR SWEATING . *NAUSEA AND VOMITING THAT IS NOT CONTROLLED WITH YOUR NAUSEA MEDICATION . *UNUSUAL SHORTNESS OF BREATH . *UNUSUAL BRUISING OR BLEEDING . *URINARY PROBLEMS (pain or burning when urinating, or frequent urination) . *BOWEL PROBLEMS (unusual diarrhea, constipation, pain near the anus) . TENDERNESS IN MOUTH AND THROAT WITH OR WITHOUT PRESENCE OF ULCERS (sore throat, sores in mouth, or a toothache) . UNUSUAL RASH, SWELLING OR PAIN  . UNUSUAL VAGINAL DISCHARGE OR ITCHING   Items with * indicate a potential emergency and should be followed up as soon as possible or go to the Emergency Department if any problems should occur.  Please show the CHEMOTHERAPY ALERT CARD or IMMUNOTHERAPY ALERT CARD at check-in to the Emergency Department and triage  nurse.  Should you have questions after your visit or need to cancel or reschedule your appointment, please contact  CANCER CENTER [phone]  and follow the prompts.  Office hours are 8:00 a.m. to 4:30 p.m. Monday - Friday. Please note that voicemails left after 4:00 p.m. may not be returned until the following business day.  We are closed weekends and major holidays. You have access to a nurse at all times for urgent questions. Please call the main number to the clinic [phone] and follow the prompts.  For any non-urgent questions, you may also contact your provider using MyChart. We now offer e-Visits for anyone 18 and older to request care online for non-urgent symptoms. For details visit [email or web address].   Also download the MyChart app! Go to the app store, search "MyChart", open the app, select , and log in with your MyChart username and password.  Due to Covid, a mask is required upon entering the hospital/clinic. If you do not have a mask, one will be given to you upon arrival. For doctor visits, patients may have 1 support person aged 18 or older with them. For treatment visits, patients cannot have anyone with them due to current Covid guidelines and our immunocompromised population.  

## 2020-12-23 LAB — COPPER, SERUM: Copper: 159 ug/dL — ABNORMAL HIGH (ref 80–158)

## 2020-12-24 LAB — PROTEIN ELECTROPHORESIS, SERUM
A/G Ratio: 1.2 (ref 0.7–1.7)
Albumin ELP: 3.4 g/dL (ref 2.9–4.4)
Alpha-1-Globulin: 0.3 g/dL (ref 0.0–0.4)
Alpha-2-Globulin: 0.8 g/dL (ref 0.4–1.0)
Beta Globulin: 1 g/dL (ref 0.7–1.3)
Gamma Globulin: 0.9 g/dL (ref 0.4–1.8)
Globulin, Total: 2.9 g/dL (ref 2.2–3.9)
Total Protein ELP: 6.3 g/dL (ref 6.0–8.5)

## 2020-12-24 LAB — METHYLMALONIC ACID, SERUM: Methylmalonic Acid, Quantitative: 134 nmol/L (ref 0–378)

## 2020-12-25 ENCOUNTER — Ambulatory Visit (HOSPITAL_COMMUNITY): Payer: Medicare PPO

## 2020-12-25 LAB — IMMUNOFIXATION ELECTROPHORESIS
IgA: 78 mg/dL — ABNORMAL LOW (ref 87–352)
IgG (Immunoglobin G), Serum: 934 mg/dL (ref 586–1602)
IgM (Immunoglobulin M), Srm: 49 mg/dL (ref 26–217)
Total Protein ELP: 6.4 g/dL (ref 6.0–8.5)

## 2020-12-26 ENCOUNTER — Encounter (HOSPITAL_COMMUNITY): Payer: Self-pay

## 2020-12-26 ENCOUNTER — Other Ambulatory Visit: Payer: Self-pay

## 2020-12-26 ENCOUNTER — Inpatient Hospital Stay (HOSPITAL_COMMUNITY): Payer: Medicare PPO

## 2020-12-26 VITALS — BP 116/56 | HR 88 | Temp 96.8°F | Resp 18

## 2020-12-26 DIAGNOSIS — D500 Iron deficiency anemia secondary to blood loss (chronic): Secondary | ICD-10-CM

## 2020-12-26 MED ORDER — FAMOTIDINE 20 MG PO TABS
20.0000 mg | ORAL_TABLET | Freq: Once | ORAL | Status: AC
Start: 2020-12-26 — End: 2020-12-26
  Administered 2020-12-26: 20 mg via ORAL
  Filled 2020-12-26: qty 1

## 2020-12-26 MED ORDER — LORATADINE 10 MG PO TABS
10.0000 mg | ORAL_TABLET | Freq: Once | ORAL | Status: AC
Start: 2020-12-26 — End: 2020-12-26
  Administered 2020-12-26: 10 mg via ORAL
  Filled 2020-12-26: qty 1

## 2020-12-26 MED ORDER — ACETAMINOPHEN 325 MG PO TABS
650.0000 mg | ORAL_TABLET | Freq: Once | ORAL | Status: AC
Start: 2020-12-26 — End: 2020-12-26
  Administered 2020-12-26: 650 mg via ORAL
  Filled 2020-12-26: qty 2

## 2020-12-26 MED ORDER — SODIUM CHLORIDE 0.9 % IV SOLN
300.0000 mg | Freq: Once | INTRAVENOUS | Status: AC
  Administered 2020-12-26: 300 mg via INTRAVENOUS
  Filled 2020-12-26: qty 300

## 2020-12-26 MED ORDER — SODIUM CHLORIDE 0.9 % IV SOLN
Freq: Once | INTRAVENOUS | Status: AC
Start: 2020-12-26 — End: 2020-12-26

## 2020-12-26 NOTE — Patient Instructions (Signed)
[facility]  Discharge Instructions: Thank you for choosing [facility] to provide your oncology and hematology care.  If you have a lab appointment with the [facility], please come in thru the Main Entrance and check in at the main information desk.  Wear comfortable clothing and clothing appropriate for easy access to any Portacath or PICC line.   We strive to give you quality time with your provider. You may need to reschedule your appointment if you arrive late (15 or more minutes).  Arriving late affects you and other patients whose appointments are after yours.  Also, if you miss three or more appointments without notifying the office, you may be dismissed from the clinic at the provider's discretion.      For prescription refill requests, have your pharmacy contact our office and allow 72 hours for refills to be completed.    Today you received the following venofer infusion.    To help prevent nausea and vomiting after your treatment, we encourage you to take your nausea medication as directed.  BELOW ARE SYMPTOMS THAT SHOULD BE REPORTED IMMEDIATELY: . *FEVER GREATER THAN 100.4 F (38 C) OR HIGHER . *CHILLS OR SWEATING . *NAUSEA AND VOMITING THAT IS NOT CONTROLLED WITH YOUR NAUSEA MEDICATION . *UNUSUAL SHORTNESS OF BREATH . *UNUSUAL BRUISING OR BLEEDING . *URINARY PROBLEMS (pain or burning when urinating, or frequent urination) . *BOWEL PROBLEMS (unusual diarrhea, constipation, pain near the anus) . TENDERNESS IN MOUTH AND THROAT WITH OR WITHOUT PRESENCE OF ULCERS (sore throat, sores in mouth, or a toothache) . UNUSUAL RASH, SWELLING OR PAIN  . UNUSUAL VAGINAL DISCHARGE OR ITCHING   Items with * indicate a potential emergency and should be followed up as soon as possible or go to the Emergency Department if any problems should occur.  Please show the CHEMOTHERAPY ALERT CARD or IMMUNOTHERAPY ALERT CARD at check-in to the Emergency Department and triage  nurse.  Should you have questions after your visit or need to cancel or reschedule your appointment, please contact [facility] [phone]  and follow the prompts.  Office hours are 8:00 a.m. to 4:30 p.m. Monday - Friday. Please note that voicemails left after 4:00 p.m. may not be returned until the following business day.  We are closed weekends and major holidays. You have access to a nurse at all times for urgent questions. Please call the main number to the clinic [phone] and follow the prompts.  For any non-urgent questions, you may also contact your provider using MyChart. We now offer e-Visits for anyone 64 and older to request care online for non-urgent symptoms. For details visit [email or web address].   Also download the MyChart app! Go to the app store, search "MyChart", open the app, select [facility], and log in with your MyChart username and password.  Due to Covid, a mask is required upon entering the hospital/clinic. If you do not have a mask, one will be given to you upon arrival. For doctor visits, patients may have 1 support person aged 64 or older with them. For treatment visits, patients cannot have anyone with them due to current Covid guidelines and our immunocompromised population.

## 2020-12-26 NOTE — Progress Notes (Signed)
Patient tolerated iron infusion with no complaints voiced.  Peripheral IV site clean and dry with good blood return noted before and after infusion.  Band aid applied.  VSS with discharge and left in satisfactory condition with no s/s of distress noted.   

## 2020-12-28 ENCOUNTER — Inpatient Hospital Stay (HOSPITAL_COMMUNITY): Payer: Medicare PPO

## 2020-12-28 VITALS — BP 97/43 | HR 69 | Temp 97.0°F | Resp 20

## 2020-12-28 DIAGNOSIS — D500 Iron deficiency anemia secondary to blood loss (chronic): Secondary | ICD-10-CM | POA: Diagnosis not present

## 2020-12-28 MED ORDER — SODIUM CHLORIDE 0.9 % IV SOLN
400.0000 mg | Freq: Once | INTRAVENOUS | Status: AC
  Administered 2020-12-28: 400 mg via INTRAVENOUS
  Filled 2020-12-28: qty 20

## 2020-12-28 MED ORDER — LORATADINE 10 MG PO TABS: 10.0000 mg | ORAL_TABLET | Freq: Once | ORAL | Status: DC

## 2020-12-28 MED ORDER — ACETAMINOPHEN 325 MG PO TABS: 650.0000 mg | ORAL_TABLET | Freq: Once | ORAL | Status: DC

## 2020-12-28 MED ORDER — SODIUM CHLORIDE 0.9 % IV SOLN: Freq: Once | INTRAVENOUS | Status: AC

## 2020-12-28 MED ORDER — FAMOTIDINE 20 MG PO TABS: 20.0000 mg | ORAL_TABLET | Freq: Once | ORAL | Status: DC

## 2020-12-28 NOTE — Progress Notes (Signed)
Patient took her pre-meds at home this morning.   Iron infusion given per orders. Patient tolerated it well without problems. Vitals stable and discharged home from clinic ambulatory. Follow up as scheduled.

## 2020-12-28 NOTE — Patient Instructions (Signed)
[facility]  Discharge Instructions: Thank you for choosing [facility] to provide your oncology and hematology care.  If you have a lab appointment with the Cancer Center, please come in thru the Main Entrance and check in at the main information desk.  Wear comfortable clothing and clothing appropriate for easy access to any Portacath or PICC line.   We strive to give you quality time with your provider. You may need to reschedule your appointment if you arrive late (15 or more minutes).  Arriving late affects you and other patients whose appointments are after yours.  Also, if you miss three or more appointments without notifying the office, you may be dismissed from the clinic at the provider's discretion.      For prescription refill requests, have your pharmacy contact our office and allow 72 hours for refills to be completed.       To help prevent nausea and vomiting after your treatment, we encourage you to take your nausea medication as directed.   Items with * indicate a potential emergency and should be followed up as soon as possible or go to the Emergency Department if any problems should occur.    Should you have questions after your visit or need to cancel or reschedule your appointment, please contact [facility] [phone]  and follow the prompts.  Office hours are 8:00 a.m. to 4:30 p.m. Monday - Friday. Please note that voicemails left after 4:00 p.m. may not be returned until the following business day.  We are closed weekends and major holidays. You have access to a nurse at all times for urgent questions. Please call the main number to the clinic [phone] and follow the prompts.  For any non-urgent questions, you may also contact your provider using MyChart. We now offer e-Visits for anyone 18 and older to request care online for non-urgent symptoms. For details visit [email or web address].   Also download the MyChart app! Go  to the app store, search "MyChart", open the app, select [facility], and log in with your MyChart username and password.  Due to Covid, a mask is required upon entering the hospital/clinic. If you do not have a mask, one will be given to you upon arrival. For doctor visits, patients may have 1 support person aged 18 or older with them. For treatment visits, patients cannot have anyone with them due to current Covid guidelines and our immunocompromised population.  

## 2021-01-03 ENCOUNTER — Other Ambulatory Visit: Payer: Self-pay

## 2021-01-03 ENCOUNTER — Inpatient Hospital Stay (HOSPITAL_BASED_OUTPATIENT_CLINIC_OR_DEPARTMENT_OTHER): Payer: Medicare PPO | Admitting: Physician Assistant

## 2021-01-03 DIAGNOSIS — E559 Vitamin D deficiency, unspecified: Secondary | ICD-10-CM

## 2021-01-03 DIAGNOSIS — D500 Iron deficiency anemia secondary to blood loss (chronic): Secondary | ICD-10-CM | POA: Diagnosis not present

## 2021-01-03 MED ORDER — CHOLECALCIFEROL 50 MCG (2000 UT) PO CAPS: 1.0000 | ORAL_CAPSULE | Freq: Every day | ORAL | 5 refills | Status: DC

## 2021-01-03 NOTE — Progress Notes (Signed)
Virtual Visit via Telephone Note [facility]  I connected with Heidi [name] on 01/03/21  at 12:03 PM by telephone and verified that I am speaking with the correct person using two identifiers.  Location: Patient: Home Provider: [facility]   I discussed the limitations, risks, security and privacy concerns of performing an evaluation and management service by telephone and the availability of in person appointments. I also discussed with the patient that there may be a patient responsible charge related to this service. The patient expressed understanding and agreed to proceed.   History of Present Illness: Heidi [name] is contacted today for follow-up of her iron deficiency anemia.  She was seen for initial consultation on 12/20/2020 by [name] and Dr. Delton Coombes.  She received Venofer x3 (given from 12/21/2020 through 12/28/2020).  She returns today to discuss results of work-up initiated at her last appointment.  She tolerated IV iron well, and reports that here energy is slightly improved at 50% (was reported as 0% at her last visit).  She continues to struggle with completing her ADLs, but not quite as severely as before. At her last visit, she reported pica symptoms and strong cravings for ice.  The symptoms are resolved after IV iron infusion.  Her appetite is 50% and she reports that she is maintaining a stable weight.   She does report some diarrhea after IV iron.  She continues to complain of intermittent melena, but as previously noted gastroenterology work-up has not found specific source of bleeding.  She denies any other sources of blood loss such as epistaxis, hemoptysis, hematochezia, or hematuria.  She had reported irregular vaginal bleeding, and was being worked up by gynecology with a recent uterine biopsy, which was reportedly normal part from some bacteria.  She continues to have intermittent chest pain and dyspnea on exertion, and is  scheduled to see her cardiologist in June.  She has not had any syncopal episodes or palpitations.  Other complaints discussed today include chronic bilateral shoulder pain and peripheral neuropathy.     Observations/Objective: Review of Systems  Constitutional: Positive for malaise/fatigue (energy 50%). Negative for chills, diaphoresis, fever and weight loss.  Respiratory: Positive for shortness of breath (with exertion). Negative for cough.   Cardiovascular: Positive for chest pain. Negative for palpitations.  Gastrointestinal: Positive for constipation, diarrhea and melena. Negative for abdominal pain, nausea and vomiting.  Musculoskeletal: Positive for joint pain (shoulders).  Neurological: Negative for headaches.  Psychiatric/Behavioral: The patient is nervous/anxious.      PHYSICAL EXAM (per limitations of virtual telephone visit): The patient is alert and oriented x 3, exhibiting adequate mentation, good mood, and ability to speak in full sentences and execute sound judgement.    ASSESSMENT: 1.  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  However, gastroenterology work-up thus far has not found specific source of bleeding. -EGD on 05/15/2020 which showed gastritis and one gastric polyp. -Colonoscopy on 08/14/2020 showed nonbleeding internal hemorrhoids, diverticulosis, and one polyp. -Patient is unable to have capsule endoscopy due to implanted pacemaker.   -Most recent labs (10/14/2020) show microcytic anemia with MCV 76.4and hemoglobin 8.8.    Ferritin was 10.2 on 11/06/2020. -Unable to tolerate iron pill due to severe constipation --Symptomatic with severe fatigue and dyspnea on exertion and pica - Received IV Venofer x3 (last dose on 12/28/2020), with some improvement in energy - Work-up for other causes of anemia  was unremarkable: Normal SPEP/IFE/free light chains, normal B12/MMA, folate, copper;  LDH normal; reticulocytes low at 2.2 likely hypoproliferation in the setting of iron deficiency; erythropoietin appropriately elevated at 119.4  2. History of right breast DCIS -- Patient does have a history ofstage 0breast cancer,multicentric DCIS of the right breast, s/p right mastectomy. Pathology showed no residual disease, ER and PR status could not be assessed. Patient was on norethindrone forabnormal uterine function at the time of her diagnosis.She did not receive chemo or radiation; she was not placed on tamoxifen or anastrozole. Her oncologist discharged her to PCP for annual breast exams and mammograms. --Most recent mammogram in March 2022 reportedly normal  3.  Other history -PMH:  Other PMH includes type 2 diabetes mellitus, hypertension, tachybradycardia syndrome s/p pacemaker, Sjogren's disease, chronic pain syndrome(fibromyalgia and arthritis)on methadone, and other conditions noted elsewhere in medical record. She is awaiting rotator cuff surgery of both shoulders. She has chronic back pain and spinal stenosis. She has sleep apnea, uses CPAP at home. -Social history: She lives at home with her husband. 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 She denies tobacco, alcohol, illicit drug use. -Family history:  Her family history is positive for paternal grandfather with colon cancer, paternal grandmother with ovarian cancer.   PLAN: 1.  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  Vitamin D deficiency - Labs (12/20/2020) show decreased vitamin D at 20.74 - Prescription sent prescription sent for daily vitamin D supplementation 2000 units  3.  History of right breast DCIS --Continue annual mammogram and breast exam with PCP  4. Chest pain --No chest pain during  today's visit --Advised to discuss chest pain with cardiologist at upcoming appointment --Advised to present to ED with chest pain that does not resolve with rest or with any alarm symptoms   Follow Up Instructions: -Start taking vitamin D 2000 units daily - Labs and RTC in 1 month    I discussed the assessment and treatment plan with the patient. The patient was provided an opportunity to ask questions and all were answered. The patient agreed with the plan and demonstrated an understanding of the instructions.   The patient was advised to call back or seek an in-person evaluation if the symptoms worsen or if the condition fails to improve as anticipated.  I provided 17 minutes of non-face-to-face time during this encounter.   Harriett Rush, PA-C

## 2021-01-04 ENCOUNTER — Telehealth (HOSPITAL_COMMUNITY): Payer: Medicare PPO | Admitting: Physician Assistant

## 2021-01-17 ENCOUNTER — Other Ambulatory Visit: Payer: Self-pay | Admitting: Nurse Practitioner

## 2021-01-17 DIAGNOSIS — K219 Gastro-esophageal reflux disease without esophagitis: Secondary | ICD-10-CM

## 2021-01-17 DIAGNOSIS — K2270 Barrett's esophagus without dysplasia: Secondary | ICD-10-CM

## 2021-01-17 NOTE — Telephone Encounter (Signed)
Request for omeprazole. She has both omeprazole and pantoprazole on her med list. Please clarify which med she is on.

## 2021-01-18 NOTE — Telephone Encounter (Signed)
Phoned and spoke with the pt and she is taking Omeprazole.

## 2021-02-01 NOTE — Progress Notes (Signed)
Heidi [name], [place] [id number]   CLINIC:  Medical Oncology/Hematology  PCP:  Wendi Maya, NP [address] [place] [address] [phone]   REASON FOR VISIT:  Follow-up for iron deficiency anemia  CURRENT THERAPY: Intermittent IV iron (last Venofer 12/28/2020)  INTERVAL HISTORY:  Heidi [name] 65 y.o. female returns for routine follow-up of her iron deficiency anemia.  She was last contacted on 01/03/2021 by [name] for telephone visit.  At today's visit, the patient reports feeling somewhat poor.  She is having an exacerbation of her fibromyalgia with significant pain.  She also has rheumatoid arthritis and is on methotrexate.  She initially felt some improvement in her fatigue after the IV iron.  However her energy the past few weeks has been low, which she thinks that this may be related to her other comorbidities.  She does continue to remain fatigued, very low stamina. She denies any current pica symptoms, these have resolved.   She continues to complain of intermittent melena - but less frequently than before.   Last melena was about 1 week ago, but as previously noted gastroenterology work-up has not found specific source of bleeding.  She denies any other sources of blood loss such as epistaxis, hemoptysis, hematochezia, or hematuria.  She had previously reported irregular vaginal bleeding, but this has resolved.    She continues to have intermittent chest pain and dyspnea on exertion, but saw her cardiologist last week, who reported she was doing well.  The patient thinks that her symptoms might be related to her anxiety attacks.  She complains of positional dizziness.  She has not had any syncopal episodes or palpitations.  She has 50% energy and 70% appetite. She endorses that she is maintaining a stable weight.    REVIEW OF SYSTEMS:  Review of Systems  Constitutional:  Positive for fatigue. Negative for  appetite change, chills, diaphoresis, fever and unexpected weight change.  HENT:   Negative for lump/mass and nosebleeds.   Eyes:  Negative for eye problems.  Respiratory:  Negative for cough, hemoptysis and shortness of breath.   Cardiovascular:  Negative for chest pain, leg swelling and palpitations.  Gastrointestinal:  Negative for abdominal pain, blood in stool, constipation, diarrhea, nausea and vomiting.  Genitourinary:  Positive for difficulty urinating. Negative for hematuria.   Musculoskeletal:  Positive for arthralgias and myalgias.  Skin: Negative.   Neurological:  Positive for dizziness and numbness (Hands and feet bilaterally). Negative for headaches and light-headedness.  Hematological:  Does not bruise/bleed easily.  Psychiatric/Behavioral:  Positive for sleep disturbance.      PAST MEDICAL/SURGICAL HISTORY:  Past Medical History:  Diagnosis Date   Anemia    takes iron supplement   Anxiety    Arthritis    Asthma    prn neb.   Cancer [facility])    right breast cancer   Depression    Diabetes mellitus    IDDM   Diverticulosis    Environmental allergies    GERD (gastroesophageal reflux disease)    Headache(784.0)    migraines   History of MRSA infection 08/2012   right breast   Hypertension    under control with med., has been on med. x 10-15 yr.   Overactive bladder    PONV (postoperative nausea and vomiting)    used scop patch 7/14-please use again per pt request   Sjogren's disease ([place])    Sleep apnea    uses CPAP nightly  Past Surgical History:  Procedure Laterality Date   ANKLE ARTHROSCOPY Left    BIOPSY  05/15/2020   Procedure: BIOPSY;  Surgeon: Eloise Harman, DO;  [facility];  Service: Endoscopy;;  gastric    BREAST RECONSTRUCTION  05/24/2012   Procedure: BREAST RECONSTRUCTION;  Surgeon: Cristine Polio, MD;  Location: [place];  Service: Plastics;  Laterality: Right;  NO FLEX HD   BREAST RECONSTRUCTION Right 01/17/2013    Procedure: RIGHT BREAST RECONSTRUCTION WITH PLACEMENT OF TISSUE EXPANDER ;  Surgeon: Cristine Polio, MD;  Location: [place];  Service: Plastics;  Laterality: Right;   BREAST REDUCTION SURGERY Left 02/28/2013   Procedure: LEFT BREAST REDUCTION  (BREAST);  Surgeon: Cristine Polio, MD;  Location: [place];  Service: Plastics;  Laterality: Left;   CESAREAN SECTION     CHOLECYSTECTOMY     COLONOSCOPY WITH PROPOFOL N/A 08/14/2020   non-bleeding internal hemorrhoids, multiple small mouth diverticula, 5 mm polyp in descending colon, fair prep. Tubular adenoma. Surveillance in 5 years.    ESOPHAGOGASTRODUODENOSCOPY (EGD) WITH PROPOFOL N/A 05/15/2020   gastritis, reactive gastropathy. negative H.pylori. one gastric polyp. normal duodenum   INSERT / REPLACE / REMOVE PACEMAKER     MASTECTOMY Right 03/2012   POLYPECTOMY  05/15/2020   Procedure: POLYPECTOMY;  Surgeon: Eloise Harman, DO;  [facility];  Service: Endoscopy;;  antral   POLYPECTOMY  08/14/2020   Procedure: POLYPECTOMY;  Surgeon: Eloise Harman, DO;  [facility];  Service: Endoscopy;;   REMOVAL OF TISSUE EXPANDER AND PLACEMENT OF IMPLANT Right 05/10/2013   Procedure: RIGHT REMOVAL OF TISSUE EXPANDER ;  Surgeon: Cristine Polio, MD;  Location: [place];  Service: Plastics;  Laterality: Right;   SHOULDER ARTHROSCOPY Right    TISSUE EXPANDER PLACEMENT  05/24/2012   Procedure: TISSUE EXPANDER;  Surgeon: Cristine Polio, MD;  Location: [place];  Service: Plastics;  Laterality: Right;     SOCIAL HISTORY:  Social History   Socioeconomic History   Marital status: Married    Spouse name: Not on file   Number of children: Not on file   Years of education: Not on file   Highest education level: Not on file  Occupational History   Not on file  Tobacco Use   Smoking status: Never   Smokeless tobacco: Never  Vaping Use   Vaping Use: Never used   Substance and Sexual Activity   Alcohol use: No   Drug use: No   Sexual activity: Not Currently  Other Topics Concern   Not on file  Social History Narrative   Not on file   Social Determinants of Health   Financial Resource Strain: Not on file  Food Insecurity: No Food Insecurity   Worried About Running Out of Food in the Last Year: Never true   [place] in the Last Year: Never true  Transportation Needs: No Transportation Needs   Lack of Transportation (Medical): No   Lack of Transportation (Non-Medical): No  Physical Activity: Inactive   Days of Exercise per Week: 0 days   Minutes of Exercise per Session: 0 min  Stress: No Stress Concern Present   Feeling of Stress : Only a little  Social Connections: Not on file  Intimate Partner Violence: Not At Risk   Fear of Current or Ex-Partner: No   Emotionally Abused: No   Physically Abused: No   Sexually Abused: No    FAMILY  HISTORY:  Family History  Problem Relation Age of Onset   Colon cancer Paternal Grandmother    Colon cancer Paternal Grandfather     CURRENT MEDICATIONS:  Outpatient Encounter Medications as of 02/04/2021  Medication Sig   allopurinol (ZYLOPRIM) 100 MG tablet Take 100 mg by mouth daily.   allopurinol (ZYLOPRIM) 100 MG tablet 1/2 daily per rhematology   benazepril-hydrochlorthiazide (LOTENSIN HCT) 20-25 MG tablet Take 1 tablet by mouth daily.   benztropine (COGENTIN) 0.5 MG tablet Take 0.5 mg by mouth 2 (two) times daily.   Cholecalciferol 50 MCG (2000 UT) CAPS Take 1 capsule (2,000 Units total) by mouth daily.   clonazePAM (KLONOPIN) 0.5 MG tablet Take 0.5 mg by mouth at bedtime as needed for anxiety (sleep).   colchicine 0.6 MG tablet Take 0.6 mg by mouth daily as needed (gout flare).   cyanocobalamin (,VITAMIN B-12,) 1000 MCG/ML injection See admin instructions.   cyclobenzaprine (FLEXERIL) 5 MG tablet 1 tab(s)   DULoxetine (CYMBALTA) 60 MG capsule Take 60 mg by mouth 2 (two) times daily.    folic acid (FOLVITE) 1 MG tablet Take 1 mg by mouth daily.   folic acid (FOLVITE) 1 MG tablet Take by mouth.   furosemide (LASIX) 20 MG tablet Take 20 mg by mouth daily.   hydroxychloroquine (PLAQUENIL) 200 MG tablet Take 400 mg by mouth daily.   lamoTRIgine (LAMICTAL) 25 MG tablet Take 25 mg by mouth 2 (two) times daily.   lidocaine (LIDODERM) 5 % Place 1 patch onto the skin daily as needed for pain.   metFORMIN (GLUCOPHAGE) 1000 MG tablet Take 1,000 mg by mouth 2 (two) times daily with a meal.   methadone (DOLOPHINE) 5 MG tablet Take 5 mg by mouth 2 (two) times daily.   methotrexate (RHEUMATREX) 5 MG tablet 4 tabs   methotrexate 2.5 MG tablet Take 4 tablets by mouth every Friday.   Misc. Devices (ROLLATOR ULTRA-LIGHT) MISC See admin instructions.   modafinil (PROVIGIL) 200 MG tablet Take 200 mg by mouth daily.   Naldemedine Tosylate (SYMPROIC) 0.2 MG TABS Take 1 tablet by mouth daily. With or without food   NYSTATIN powder Apply 2-3 application topically daily.   omeprazole (PRILOSEC) 40 MG capsule TAKE 1 CAPSULE BY MOUTH IN THE MORNING AND TAKE 1 CAPSULE AT BEDTIME   oxybutynin (DITROPAN) 5 MG tablet Take 5 mg by mouth 3 (three) times daily.   oxybutynin (DITROPAN) 5 MG tablet 1 tab(s)   prednisoLONE acetate (PRED FORTE) 1 % ophthalmic suspension Place 1 drop into both eyes daily as needed (dry eyes).   pregabalin (LYRICA) 100 MG capsule Take 100 mg by mouth 2 (two) times daily.   promethazine-dextromethorphan (PROMETHAZINE-DM) 6.25-15 MG/5ML syrup 5 mL   propranolol (INDERAL) 10 MG tablet Take 10 mg by mouth 2 (two) times daily.   No facility-administered encounter medications on file as of 02/04/2021.    ALLERGIES:  Allergies  Allergen Reactions   Soap Other (See Comments)    FRAGRANCES CAUSE MIGRAINES   Allevyn Adhesive [Wound Dressings]     Other reaction(s): Unknown   Aripiprazole     Other reaction(s): hallucination   Cipro [Ciprofloxacin Hcl] Nausea And Vomiting    Ketorolac Tromethamine     Other reaction(s): confusion   Mixed Grasses Other (See Comments)   Other     Other reaction(s): confusion   Pollen Extract Other (See Comments)   Prednisone     Other reaction(s): elevated blood glucose   Requip [Ropinirole]  Other reaction(s): confusion   Risperidone     Other reaction(s): hallucination   Sulfamethoxazole-Trimethoprim     Other reaction(s): hives, itching   Vraylar [Cariprazine]     Hallucinations    Latex Itching   Sulfa Antibiotics Rash    Other reaction(s): nausea     PHYSICAL EXAM:  ECOG PERFORMANCE STATUS: 1 - Symptomatic but completely ambulatory  There were no vitals filed for this visit. There were no vitals filed for this visit. Physical Exam Constitutional:      Appearance: Normal appearance. She is obese.  HENT:     Head: Normocephalic and atraumatic.     Mouth/Throat:     Mouth: Mucous membranes are moist.  Eyes:     Extraocular Movements: Extraocular movements intact.     Pupils: Pupils are equal, round, and reactive to light.  Cardiovascular:     Rate and Rhythm: Normal rate and regular rhythm.     Pulses: Normal pulses.     Heart sounds: Normal heart sounds.  Pulmonary:     Effort: Pulmonary effort is normal.     Breath sounds: Normal breath sounds.  Abdominal:     General: Bowel sounds are normal.     Palpations: Abdomen is soft.     Tenderness: There is no abdominal tenderness.  Musculoskeletal:        General: No swelling.     Right lower leg: Edema (Trace) present.     Left lower leg: Edema (Trace) present.  Lymphadenopathy:     Cervical: No cervical adenopathy.  Skin:    General: Skin is warm and dry.  Neurological:     General: No focal deficit present.     Mental Status: She is alert and oriented to person, place, and time.  Psychiatric:        Mood and Affect: Mood normal.        Behavior: Behavior normal.     LABORATORY DATA:  I have reviewed the labs as listed.  CBC     Component Value Date/Time   WBC 5.8 12/20/2020 1044   RBC 3.69 (L) 12/20/2020 1044   RBC 3.64 (L) 12/20/2020 1044   HGB 9.0 (L) 12/20/2020 1044   HCT 30.7 (L) 12/20/2020 1044   PLT 257 12/20/2020 1044   MCV 83.2 12/20/2020 1044   MCH 24.4 (L) 12/20/2020 1044   MCHC 29.3 (L) 12/20/2020 1044   RDW 19.9 (H) 12/20/2020 1044   LYMPHSABS 1.3 12/20/2020 1044   MONOABS 0.4 12/20/2020 1044   EOSABS 0.1 12/20/2020 1044   BASOSABS 0.0 12/20/2020 1044   CMP Latest Ref Rng & Units 12/20/2020 10/15/2020 10/14/2020  Glucose 70 - 99 mg/dL 121(H) 113(H) 122(H)  BUN 8 - 23 mg/dL 13 8 22   Creatinine 0.44 - 1.00 mg/dL 0.83 0.88 1.19(H)  Sodium 135 - 145 mmol/L 136 136 134(L)  Potassium 3.5 - 5.1 mmol/L 4.2 3.7 3.5  Chloride 98 - 111 mmol/L 102 102 101  CO2 22 - 32 mmol/L 28 21(L) 22  Calcium 8.9 - 10.3 mg/dL 9.0 8.6(L) 8.4(L)  Total Protein 6.5 - 8.1 g/dL 6.8 - 5.1(L)  Total Bilirubin 0.3 - 1.2 mg/dL 0.6 - 0.7  Alkaline Phos 38 - 126 U/L 89 - 67  AST 15 - 41 U/L 16 - 16  ALT 0 - 44 U/L 12 - 11    DIAGNOSTIC IMAGING:  I have independently reviewed the relevant imaging and discussed with the patient.  ASSESSMENT: 1.  Microcytic iron deficient anemia secondary to  chronic GI blood loss -Patient has had intermittent melena for the past year.  However, gastroenterology work-up thus far has not found specific source of bleeding. -EGD on 05/15/2020 which showed gastritis and one gastric polyp. -Colonoscopy on 08/14/2020 showed nonbleeding internal hemorrhoids, diverticulosis, and one polyp. -Patient is unable to have capsule endoscopy due to implanted pacemaker.   -Most recent labs (10/14/2020) show microcytic anemia with MCV 76.4and hemoglobin 8.8.    Ferritin was 10.2 on 11/06/2020. -Unable to tolerate iron pill due to severe constipation -- Was symptomatic with severe fatigue and dyspnea on exertion and pica - somewhat improved after IV iron supplementation - Received IV Venofer x3 (last dose on  12/28/2020), with some improvement in energy - Work-up for other causes of anemia was unremarkable: Normal SPEP/IFE/free light chains, normal B12/MMA, folate, copper; LDH normal; reticulocytes low at 2.2 likely hypoproliferation in the setting of iron deficiency; erythropoietin appropriately elevated at 119.4   2. History of right breast DCIS -- Patient does have a history of stage 0 breast cancer, multicentric DCIS of the right breast, s/p right mastectomy.  Pathology showed no residual disease, ER and PR status could not be assessed. Patient was on norethindrone for abnormal uterine function at the time of her diagnosis.  She did not receive chemo or radiation; she was not placed on tamoxifen or anastrozole.  Her oncologist discharged her to PCP for annual breast exams and mammograms. --Most recent mammogram in March 2022 reportedly normal   3.  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  She is awaiting rotator cuff surgery of both shoulders.  She has chronic back pain and spinal stenosis.  She has sleep apnea, uses CPAP at home. -Social history: She lives at home with her husband.  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 She denies tobacco, alcohol, illicit drug use. -Family history:  Her family history is positive for paternal grandfather with colon cancer, paternal grandmother with ovarian cancer.     PLAN: 1.  Microcytic iron deficiency anemia secondary to chronic GI blood loss - Labs today (02/04/2021) show improved Hgb 12.4/MCV 89.6; normal ferritin 90 with iron saturation 24% - No indication for IV iron at this time - RTC in 3 months for repeat labs - Patient reports that she is having shoulder surgery, but that her orthopedic surgeon had  wanted to hold off on surgery until her blood counts improved.  From hematology standpoint, her blood counts are within normal limits and there is no indication based on hematology for her to not have surgery at this time.   2.  Vitamin D deficiency - Labs (12/20/2020) show decreased vitamin D at 20.74 - Prescription sent prescription sent for daily vitamin D supplementation 2000 units - Will repeat labs to check vitamin D in about 3 months   3.  History of right breast DCIS --Continue annual mammogram and breast exam with PCP  4.  Vitamin B12 deficiency - Patient reports that she gets B12 injections from her PCP    PLAN SUMMARY & DISPOSITION: -Labs and RTC in 3 months  All questions were answered. The patient knows to call the clinic with any problems, questions or concerns.  Medical decision making: Low  Time spent on visit: I spent 20 minutes counseling the patient face to face. The total time spent  in the appointment was 30 minutes and more than 50% was on counseling.   Harriett Rush, PA-C  02/04/21 5:28 PM

## 2021-02-04 ENCOUNTER — Inpatient Hospital Stay (HOSPITAL_COMMUNITY): Payer: Medicare PPO | Admitting: Physician Assistant

## 2021-02-04 ENCOUNTER — Other Ambulatory Visit: Payer: Self-pay

## 2021-02-04 ENCOUNTER — Inpatient Hospital Stay (HOSPITAL_COMMUNITY): Payer: Medicare PPO | Attending: Hematology

## 2021-02-04 VITALS — BP 110/69 | HR 63 | Temp 98.4°F | Resp 16 | Wt 263.9 lb

## 2021-02-04 DIAGNOSIS — E559 Vitamin D deficiency, unspecified: Secondary | ICD-10-CM | POA: Diagnosis not present

## 2021-02-04 DIAGNOSIS — R42 Dizziness and giddiness: Secondary | ICD-10-CM | POA: Insufficient documentation

## 2021-02-04 DIAGNOSIS — E119 Type 2 diabetes mellitus without complications: Secondary | ICD-10-CM | POA: Diagnosis not present

## 2021-02-04 DIAGNOSIS — Z79899 Other long term (current) drug therapy: Secondary | ICD-10-CM | POA: Diagnosis not present

## 2021-02-04 DIAGNOSIS — I10 Essential (primary) hypertension: Secondary | ICD-10-CM | POA: Diagnosis not present

## 2021-02-04 DIAGNOSIS — E538 Deficiency of other specified B group vitamins: Secondary | ICD-10-CM | POA: Diagnosis not present

## 2021-02-04 DIAGNOSIS — D500 Iron deficiency anemia secondary to blood loss (chronic): Secondary | ICD-10-CM

## 2021-02-04 DIAGNOSIS — G4730 Sleep apnea, unspecified: Secondary | ICD-10-CM | POA: Insufficient documentation

## 2021-02-04 DIAGNOSIS — M3500 Sicca syndrome, unspecified: Secondary | ICD-10-CM | POA: Insufficient documentation

## 2021-02-04 DIAGNOSIS — K219 Gastro-esophageal reflux disease without esophagitis: Secondary | ICD-10-CM | POA: Diagnosis not present

## 2021-02-04 DIAGNOSIS — K921 Melena: Secondary | ICD-10-CM | POA: Insufficient documentation

## 2021-02-04 DIAGNOSIS — R079 Chest pain, unspecified: Secondary | ICD-10-CM | POA: Diagnosis not present

## 2021-02-04 DIAGNOSIS — Z86000 Personal history of in-situ neoplasm of breast: Secondary | ICD-10-CM | POA: Diagnosis not present

## 2021-02-04 DIAGNOSIS — M797 Fibromyalgia: Secondary | ICD-10-CM | POA: Insufficient documentation

## 2021-02-04 DIAGNOSIS — M069 Rheumatoid arthritis, unspecified: Secondary | ICD-10-CM | POA: Insufficient documentation

## 2021-02-04 LAB — CBC WITH DIFFERENTIAL/PLATELET
Abs Immature Granulocytes: 0.03 10*3/uL (ref 0.00–0.07)
Basophils Absolute: 0.1 10*3/uL (ref 0.0–0.1)
Basophils Relative: 1 %
Eosinophils Absolute: 0.2 10*3/uL (ref 0.0–0.5)
Eosinophils Relative: 2 %
HCT: 38.9 % (ref 36.0–46.0)
Hemoglobin: 12.4 g/dL (ref 12.0–15.0)
Immature Granulocytes: 0 %
Lymphocytes Relative: 28 %
Lymphs Abs: 2 10*3/uL (ref 0.7–4.0)
MCH: 28.6 pg (ref 26.0–34.0)
MCHC: 31.9 g/dL (ref 30.0–36.0)
MCV: 89.6 fL (ref 80.0–100.0)
Monocytes Absolute: 0.5 10*3/uL (ref 0.1–1.0)
Monocytes Relative: 7 %
Neutro Abs: 4.5 10*3/uL (ref 1.7–7.7)
Neutrophils Relative %: 62 %
Platelets: 205 10*3/uL (ref 150–400)
RBC: 4.34 MIL/uL (ref 3.87–5.11)
RDW: 19.5 % — ABNORMAL HIGH (ref 11.5–15.5)
WBC: 7.3 10*3/uL (ref 4.0–10.5)
nRBC: 0 % (ref 0.0–0.2)

## 2021-02-04 LAB — IRON AND TIBC
Iron: 95 ug/dL (ref 28–170)
Saturation Ratios: 24 % (ref 10.4–31.8)
TIBC: 398 ug/dL (ref 250–450)
UIBC: 303 ug/dL

## 2021-02-04 LAB — FERRITIN: Ferritin: 90 ng/mL (ref 11–307)

## 2021-02-04 NOTE — Patient Instructions (Signed)
[place] at [facility] Discharge Instructions  You were seen today by [name] for your anemia.  Your blood counts and iron levels are much better - they are now within normal ranges!    LABS: Return in 3 months for repeat labs  FOLLOW-UP APPOINTMENT: Office visit in 3 months   Thank you for choosing [place] at [facility] to provide your oncology and hematology care.  To afford each patient quality time with our provider, please arrive at least 15 minutes before your scheduled appointment time.   If you have a lab appointment with the [place] please come in thru the Main Entrance and check in at the main information desk.  You need to re-schedule your appointment should you arrive 10 or more minutes late.  We strive to give you quality time with our providers, and arriving late affects you and other patients whose appointments are after yours.  Also, if you no show three or more times for appointments you may be dismissed from the clinic at the providers discretion.     Again, thank you for choosing [facility].  Our hope is that these requests will decrease the amount of time that you wait before being seen by our physicians.       _____________________________________________________________  Should you have questions after your visit to [facility], please contact our office at [phone] and follow the prompts.  Our office hours are 8:00 a.m. and 4:30 p.m. Monday - Friday.  Please note that voicemails left after 4:00 p.m. may not be returned until the following business day.  We are closed weekends and major holidays.  You do have access to a nurse 24-7, just call the main number to the clinic [phone] and do not press any options, hold on the line and a nurse will answer the phone.    For prescription refill requests, have your pharmacy contact our office and allow 72 hours.    Due  to Covid, you will need to wear a mask upon entering the hospital. If you do not have a mask, a mask will be given to you at the Main Entrance upon arrival. For doctor visits, patients may have 1 support person age 31 or older with them. For treatment visits, patients can not have anyone with them due to social distancing guidelines and our immunocompromised population.

## 2021-02-12 ENCOUNTER — Ambulatory Visit: Payer: Medicare PPO | Admitting: Gastroenterology

## 2021-02-22 ENCOUNTER — Other Ambulatory Visit: Payer: Self-pay | Admitting: Orthopedic Surgery

## 2021-02-22 DIAGNOSIS — Z01811 Encounter for preprocedural respiratory examination: Secondary | ICD-10-CM

## 2021-02-26 NOTE — Progress Notes (Signed)
DUE TO COVID-19 ONLY ONE VISITOR IS ALLOWED TO COME WITH YOU AND STAY IN THE WAITING ROOM ONLY DURING PRE OP AND PROCEDURE DAY OF SURGERY. THE 1 VISITOR  MAY VISIT WITH YOU AFTER SURGERY IN YOUR PRIVATE ROOM DURING VISITING HOURS ONLY!  YOU NEED TO HAVE A COVID 19 TEST ON_______ @_______ , THIS TEST MUST BE DONE BEFORE SURGERY,  COVID TESTING SITE [address] [place] [address], IT IS ON THE RIGHT GOING OUT [address] APPROXIMATELY  2 MINUTES PAST [employer] ON THE RIGHT. ONCE YOUR COVID TEST IS COMPLETED,  PLEASE BEGIN THE QUARANTINE INSTRUCTIONS AS OUTLINED IN YOUR HANDOUT.                Heidi [name]  02/26/2021   Your procedure is scheduled on:           03/07/21   Report to [facility] Main  Entrance   Report to admitting at    1230pm     Call this number if you have problems the morning of surgery [phone]    REMEMBER: NO  SOLID FOOD CANDY OR GUM AFTER MIDNIGHT. CLEAR LIQUIDS UNTIL     0845am        . NOTHING BY MOUTH EXCEPT CLEAR LIQUIDS UNTIL    0845am   . PLEASE FINISH  G2 Lower sugar  DRINK PER SURGEON ORDER  WHICH NEEDS TO BE COMPLETED AT   0845am    .      CLEAR LIQUID DIET   Foods Allowed                                                                    Coffee and tea, regular and decaf                            Fruit ices (not with fruit pulp)                                      Iced Popsicles                                    Carbonated beverages, regular and diet                                    Cranberry, grape and apple juices Sports drinks like Gatorade Lightly seasoned clear broth or consume(fat free) Sugar, honey syrup ___________________________________________________________________      BRUSH YOUR TEETH MORNING OF SURGERY AND RINSE YOUR MOUTH OUT, NO CHEWING GUM CANDY OR MINTS.     Take these medicines the morning of surgery with A SIP OF WATER:  propanolol, lyrica, ditropan, prilosec, lamictal,  cymbalta, allopurinol, cogentin   DO NOT TAKE ANY DIABETIC MEDICATIONS DAY OF YOUR SURGERY                               You may not have any  metal on your body including hair pins and              piercings  Do not wear jewelry, make-up, lotions, powders or perfumes, deodorant             Do not wear nail polish on your fingernails.  Do not shave  48 hours prior to surgery.              Men may shave face and neck.   Do not bring valuables to the hospital. [place].  Contacts, dentures or bridgework may not be worn into surgery.  Leave suitcase in the car. After surgery it may be brought to your room.     Patients discharged the day of surgery will not be allowed to drive home. IF YOU ARE HAVING SURGERY AND GOING HOME THE SAME DAY, YOU MUST HAVE AN ADULT TO DRIVE YOU HOME AND BE WITH YOU FOR 24 HOURS. YOU MAY GO HOME BY TAXI OR UBER OR ORTHERWISE, BUT AN ADULT MUST ACCOMPANY YOU HOME AND STAY WITH YOU FOR 24 HOURS.  Name and phone number of your driver:  Special Instructions: N/A              Please read over the following fact sheets you were given: _____________________________________________________________________  [facility] - Preparing for Surgery Before surgery, you can play an important role.  Because skin is not sterile, your skin needs to be as free of germs as possible.  You can reduce the number of germs on your skin by washing with CHG (chlorahexidine gluconate) soap before surgery.  CHG is an antiseptic cleaner which kills germs and bonds with the skin to continue killing germs even after washing. Please DO NOT use if you have an allergy to CHG or antibacterial soaps.  If your skin becomes reddened/irritated stop using the CHG and inform your nurse when you arrive at Short Stay. Do not shave (including legs and underarms) for at least 48 hours prior to the first CHG shower.  You may shave your face/neck. Please follow these  instructions carefully:  1.  Shower with CHG Soap the night before surgery and the  morning of Surgery.  2.  If you choose to wash your hair, wash your hair first as usual with your  normal  shampoo.  3.  After you shampoo, rinse your hair and body thoroughly to remove the  shampoo.                           4.  Use CHG as you would any other liquid soap.  You can apply chg directly  to the skin and wash                       Gently with a scrungie or clean washcloth.  5.  Apply the CHG Soap to your body ONLY FROM THE NECK DOWN.   Do not use on face/ open                           Wound or open sores. Avoid contact with eyes, ears mouth and genitals (private parts).                       Wash face,  Genitals (private parts) with your normal soap.             6.  Wash thoroughly, paying special attention to the area where your surgery  will be performed.  7.  Thoroughly rinse your body with warm water from the neck down.  8.  DO NOT shower/wash with your normal soap after using and rinsing off  the CHG Soap.                9.  Pat yourself dry with a clean towel.            10.  Wear clean pajamas.            11.  Place clean sheets on your bed the night of your first shower and do not  sleep with pets. Day of Surgery : Do not apply any lotions/deodorants the morning of surgery.  Please wear clean clothes to the hospital/surgery center.  FAILURE TO FOLLOW THESE INSTRUCTIONS MAY RESULT IN THE CANCELLATION OF YOUR SURGERY PATIENT SIGNATURE_________________________________  NURSE SIGNATURE__________________________________  ________________________________________________________________________              [facility]- Preparing for Total Shoulder Arthroplasty    Before surgery, you can play an important role. Because skin is not sterile, your skin needs to be as free of germs as possible. You can reduce the number of germs on your skin by using the following products. Benzoyl Peroxide  Gel Reduces the number of germs present on the skin Applied twice a day to shoulder area starting two days before surgery    ==================================================================  Please follow these instructions carefully:  BENZOYL PEROXIDE 5% GEL  Please do not use if you have an allergy to benzoyl peroxide.   If your skin becomes reddened/irritated stop using the benzoyl peroxide.  Starting two days before surgery, apply as follows: Apply benzoyl peroxide in the morning and at night. Apply after taking a shower. If you are not taking a shower clean entire shoulder front, back, and side along with the armpit with a clean wet washcloth.  Place a quarter-sized dollop on your shoulder and rub in thoroughly, making sure to cover the front, back, and side of your shoulder, along with the armpit.   2 days before ____ AM   ____ PM              1 day before ____ AM   ____ PM                         Do this twice a day for two days.  (Last application is the night before surgery, AFTER using the CHG soap as described below).  Do NOT apply benzoyl peroxide gel on the day of surgery.

## 2021-02-27 ENCOUNTER — Encounter (HOSPITAL_COMMUNITY): Payer: Self-pay

## 2021-02-27 ENCOUNTER — Other Ambulatory Visit: Payer: Self-pay

## 2021-02-27 ENCOUNTER — Encounter (HOSPITAL_COMMUNITY)
Admission: RE | Admit: 2021-02-27 | Discharge: 2021-02-27 | Disposition: A | Discharge status: Home / Self Care | Payer: Medicare PPO | Source: Ambulatory Visit | Attending: Orthopedic Surgery | Admitting: Orthopedic Surgery

## 2021-02-27 ENCOUNTER — Telehealth: Payer: Self-pay

## 2021-02-27 DIAGNOSIS — Z01812 Encounter for preprocedural laboratory examination: Secondary | ICD-10-CM | POA: Diagnosis present

## 2021-02-27 DIAGNOSIS — Z01811 Encounter for preprocedural respiratory examination: Secondary | ICD-10-CM

## 2021-02-27 HISTORY — DX: Fibromyalgia: M79.7

## 2021-02-27 HISTORY — DX: Diverticulitis of intestine, part unspecified, without perforation or abscess without bleeding: K57.92

## 2021-02-27 HISTORY — DX: Presence of cardiac pacemaker: Z95.0

## 2021-02-27 HISTORY — DX: Diverticulitis of large intestine without perforation or abscess without bleeding: K57.32

## 2021-02-27 HISTORY — DX: Pneumonia, unspecified organism: J18.9

## 2021-02-27 LAB — COMPREHENSIVE METABOLIC PANEL
ALT: 17 U/L (ref 0–44)
AST: 18 U/L (ref 15–41)
Albumin: 4.1 g/dL (ref 3.5–5.0)
Alkaline Phosphatase: 109 U/L (ref 38–126)
Anion gap: 11 (ref 5–15)
BUN: 12 mg/dL (ref 8–23)
CO2: 29 mmol/L (ref 22–32)
Calcium: 8.6 mg/dL — ABNORMAL LOW (ref 8.9–10.3)
Chloride: 95 mmol/L — ABNORMAL LOW (ref 98–111)
Creatinine, Ser: 0.83 mg/dL (ref 0.44–1.00)
GFR, Estimated: >60 mL/min (ref >60)
Glucose, Bld: 126 mg/dL — ABNORMAL HIGH (ref 70–99)
Potassium: 3.2 mmol/L — ABNORMAL LOW (ref 3.5–5.1)
Sodium: 135 mmol/L (ref 135–145)
Total Bilirubin: 0.7 mg/dL (ref 0.3–1.2)
Total Protein: 6.9 g/dL (ref 6.5–8.1)

## 2021-02-27 LAB — CBC WITH DIFFERENTIAL/PLATELET
Abs Immature Granulocytes: 0.03 10*3/uL (ref 0.00–0.07)
Basophils Absolute: 0.1 10*3/uL (ref 0.0–0.1)
Basophils Relative: 1 %
Eosinophils Absolute: 0.1 10*3/uL (ref 0.0–0.5)
Eosinophils Relative: 1 %
HCT: 38.1 % (ref 36.0–46.0)
Hemoglobin: 12.9 g/dL (ref 12.0–15.0)
Immature Granulocytes: 0 %
Lymphocytes Relative: 18 %
Lymphs Abs: 1.8 10*3/uL (ref 0.7–4.0)
MCH: 29.7 pg (ref 26.0–34.0)
MCHC: 33.9 g/dL (ref 30.0–36.0)
MCV: 87.8 fL (ref 80.0–100.0)
Monocytes Absolute: 1 10*3/uL (ref 0.1–1.0)
Monocytes Relative: 10 %
Neutro Abs: 7 10*3/uL (ref 1.7–7.7)
Neutrophils Relative %: 70 %
Platelets: 268 10*3/uL (ref 150–400)
RBC: 4.34 MIL/uL (ref 3.87–5.11)
RDW: 17.3 % — ABNORMAL HIGH (ref 11.5–15.5)
WBC: 9.9 10*3/uL (ref 4.0–10.5)
nRBC: 0 % (ref 0.0–0.2)

## 2021-02-27 LAB — HEMOGLOBIN A1C
Hgb A1c MFr Bld: 5.9 % — ABNORMAL HIGH (ref 4.8–5.6)
Mean Plasma Glucose: 122.63 mg/dL

## 2021-02-27 LAB — URINALYSIS, ROUTINE W REFLEX MICROSCOPIC
Bilirubin Urine: NEGATIVE
Glucose, UA: NEGATIVE mg/dL
Hgb urine dipstick: NEGATIVE
Ketones, ur: NEGATIVE mg/dL
Nitrite: NEGATIVE
Protein, ur: NEGATIVE mg/dL
Specific Gravity, Urine: 1.005 (ref 1.005–1.030)
pH: 7 (ref 5.0–8.0)

## 2021-02-27 LAB — APTT: aPTT: 29 seconds (ref 24–36)

## 2021-02-27 LAB — GLUCOSE, CAPILLARY: Glucose-Capillary: 139 mg/dL — ABNORMAL HIGH (ref 70–99)

## 2021-02-27 LAB — SURGICAL PCR SCREEN
MRSA, PCR: NEGATIVE
Staphylococcus aureus: NEGATIVE

## 2021-02-27 NOTE — Telephone Encounter (Signed)
Form filled out, please fax, thanks

## 2021-02-27 NOTE — Progress Notes (Addendum)
Anesthesia Review:  PCP:   DR Thornton Papas  Have requested clearance from [employer] have requested clearance from Marcelo Baldy  Oncology clearance dated 02/27/21 from DR Alvy Bimler on chart  Pulmonary- [facility] in [place] 08/23/20 on chart  clearance on chart with no date  Cardiologist :  DR Maree Krabbe in [place]  Have requested most recent office visit note and device check.  No echo or cath report available.  They are to send.  LOV 01/30/21 and last Device check 01/30/21 on chart.  Device orders dated 03/05/21 on chart  Bethena Roys will send clearance once she receives.  Chest x-ray :   10/13/20- 1 View  EKG : 10/14/20  Echo : Stress test: Cardiac Cath :  Activity level: cannot do a flight of stairs  Sleep Study/ CPAP : has cpap  Fasting Blood Sugar :      / Checks Blood Sugar -- times a day:   Blood Thinner/ Instructions /Last Dose: ASA / Instructions/ Last Dose :   DM- type 2 - does not check glucose at home  Hgba1c-02/27/21-5.9 Pacemaker  periop orders sent to [facility]  heart on 02/27/21.   CMP and U/a done 02/27/21 routed to DR [facility].  Pt was 30 minutes late for preop appt.

## 2021-02-27 NOTE — Telephone Encounter (Signed)
Documentation from [facility] orthopaedic in your box regarding a surgical clearance request to be filled out. (Pt. Parshall DOB November 21, 1955)

## 2021-02-28 NOTE — Telephone Encounter (Signed)
Attempted x 3 to fax form to [employer] and [place] to Marcelo Baldy (surgery coordinator). Phoned and LMOVM for her to call with a different number to fax surgical clearance request.

## 2021-02-28 NOTE — Progress Notes (Addendum)
Anesthesia Chart Review   Case: [id number] Date/Time: 03/07/21 1442   Procedure: REVERSE SHOULDER ARTHROPLASTY (Right: Shoulder)   Anesthesia type: Choice   Pre-op diagnosis: RIGHT SHOULDER ROTATOR CUFF TEAR ARTHROPATHY   Location: [facility] ROOM 07 / [facility]   Surgeons: Tania Ade, MD       DISCUSSION:64 y.o. never smoker with h/o PONV, GERD, DM II, HTN, sleep apnea w/CPAP, Sjogren's disease, pacemaker in place due to SSS, right shoulder rotator cuff tear scheduled for above procedure 03/07/2021 with Dr. Tania Ade.   Last seen by cardiology 01/30/2021 (notes on chart).  Per OV note, "Pt appears to be stable from a cardiac standpoint with low risk for CV events associated with rotator cuff sx."  Device orders on chart.  VS: BP 127/63   Pulse 65   Temp 36.9 C (Oral)   Resp 16   Ht 5\' 6"  (1.676 m)   Wt 119.7 kg   LMP 05/20/2012   SpO2 100%   BMI 42.59 kg/m   PROVIDERS: Wendi Maya, NP is PCP   Joneen Caraway, MD is Cardiology in [place], [place]: Labs reviewed: Acceptable for surgery. (all labs ordered are listed, but only abnormal results are displayed)  Labs Reviewed  CBC WITH DIFFERENTIAL/PLATELET - Abnormal; Notable for the following components:      Result Value   RDW 17.3 (*)    All other components within normal limits  COMPREHENSIVE METABOLIC PANEL - Abnormal; Notable for the following components:   Potassium 3.2 (*)    Chloride 95 (*)    Glucose, Bld 126 (*)    Calcium 8.6 (*)    All other components within normal limits  URINALYSIS, ROUTINE W REFLEX MICROSCOPIC - Abnormal; Notable for the following components:   Color, Urine STRAW (*)    Leukocytes,Ua SMALL (*)    Bacteria, UA RARE (*)    All other components within normal limits  HEMOGLOBIN A1C - Abnormal; Notable for the following components:   Hgb A1c MFr Bld 5.9 (*)    All other components within normal limits  GLUCOSE, CAPILLARY - Abnormal; Notable for the following components:    Glucose-Capillary 139 (*)    All other components within normal limits  SURGICAL PCR SCREEN  APTT  TYPE AND SCREEN     IMAGES:   EKG:   CV: Echo 07/01/2013  Left Ventricle: Normal cavity size and wall thickness. Ejection fraction  is 60 - 65%. Global and segmental wall motion within normal limits.   Normal Doppler.  Past Medical History:  Diagnosis Date   Anemia    takes iron supplement   Anxiety    Arthritis    Asthma    prn neb.   Cancer [facility]    right breast cancer   Depression    Diabetes mellitus    IDDM   Diverticulitis    Diverticulitis large intestine    Diverticulosis    Environmental allergies    Fibromyalgia    GERD (gastroesophageal reflux disease)    Headache(784.0)    migraines   History of MRSA infection 08/2012   right breast   Hypertension    under control with med., has been on med. x 10-15 yr.   Overactive bladder    Pneumonia    PONV (postoperative nausea and vomiting)    used scop patch 7/14-please use again per pt request   Presence of permanent cardiac pacemaker    Sjogren's disease ([place])    Sleep apnea    uses CPAP  nightly    Past Surgical History:  Procedure Laterality Date   ANKLE ARTHROSCOPY Left    BIOPSY  05/15/2020   Procedure: BIOPSY;  Surgeon: Eloise Harman, DO;  [facility];  Service: Endoscopy;;  gastric    BREAST RECONSTRUCTION  05/24/2012   Procedure: BREAST RECONSTRUCTION;  Surgeon: Cristine Polio, MD;  Location: [place];  Service: Plastics;  Laterality: Right;  NO FLEX HD   BREAST RECONSTRUCTION Right 01/17/2013   Procedure: RIGHT BREAST RECONSTRUCTION WITH PLACEMENT OF TISSUE EXPANDER ;  Surgeon: Cristine Polio, MD;  Location: [place];  Service: Plastics;  Laterality: Right;   BREAST REDUCTION SURGERY Left 02/28/2013   Procedure: LEFT BREAST REDUCTION  (BREAST);  Surgeon: Cristine Polio, MD;  Location: [place];  Service: Plastics;   Laterality: Left;   CESAREAN SECTION     CHOLECYSTECTOMY     COLONOSCOPY WITH PROPOFOL N/A 08/14/2020   non-bleeding internal hemorrhoids, multiple small mouth diverticula, 5 mm polyp in descending colon, fair prep. Tubular adenoma. Surveillance in 5 years.    ESOPHAGOGASTRODUODENOSCOPY (EGD) WITH PROPOFOL N/A 05/15/2020   gastritis, reactive gastropathy. negative H.pylori. one gastric polyp. normal duodenum   INSERT / REPLACE / REMOVE PACEMAKER     MASTECTOMY Right 03/2012   POLYPECTOMY  05/15/2020   Procedure: POLYPECTOMY;  Surgeon: Eloise Harman, DO;  [facility];  Service: Endoscopy;;  antral   POLYPECTOMY  08/14/2020   Procedure: POLYPECTOMY;  Surgeon: Eloise Harman, DO;  [facility];  Service: Endoscopy;;   REMOVAL OF TISSUE EXPANDER AND PLACEMENT OF IMPLANT Right 05/10/2013   Procedure: RIGHT REMOVAL OF TISSUE EXPANDER ;  Surgeon: Cristine Polio, MD;  Location: [place];  Service: Plastics;  Laterality: Right;   SHOULDER ARTHROSCOPY Right    TISSUE EXPANDER PLACEMENT  05/24/2012   Procedure: TISSUE EXPANDER;  Surgeon: Cristine Polio, MD;  Location: [place];  Service: Plastics;  Laterality: Right;    MEDICATIONS:  allopurinol (ZYLOPRIM) 100 MG tablet   benazepril-hydrochlorthiazide (LOTENSIN HCT) 20-25 MG tablet   benztropine (COGENTIN) 0.5 MG tablet   Cholecalciferol 50 MCG (2000 UT) CAPS   clonazePAM (KLONOPIN) 0.5 MG tablet   colchicine 0.6 MG tablet   cyanocobalamin (,VITAMIN B-12,) 1000 MCG/ML injection   cycloSPORINE (RESTASIS) 0.05 % ophthalmic emulsion   DULoxetine (CYMBALTA) 60 MG capsule   folic acid (FOLVITE) 1 MG tablet   furosemide (LASIX) 20 MG tablet   hydroxychloroquine (PLAQUENIL) 200 MG tablet   lamoTRIgine (LAMICTAL) 100 MG tablet   metFORMIN (GLUCOPHAGE) 1000 MG tablet   methadone (DOLOPHINE) 5 MG tablet   methotrexate 2.5 MG tablet   Misc. Devices (ROLLATOR ULTRA-LIGHT) MISC   modafinil  (PROVIGIL) 200 MG tablet   Multiple Vitamins-Minerals (PRESERVISION AREDS) CAPS   Naldemedine Tosylate (SYMPROIC) 0.2 MG TABS   NYSTATIN powder   omeprazole (PRILOSEC) 40 MG capsule   oxybutynin (DITROPAN) 5 MG tablet   prednisoLONE acetate (PRED FORTE) 1 % ophthalmic suspension   pregabalin (LYRICA) 100 MG capsule   propranolol (INDERAL) 10 MG tablet   No current facility-administered medications for this encounter.    Konrad Felix, PA-C [facility] Pre-Surgical Testing [phone]

## 2021-03-01 NOTE — Telephone Encounter (Signed)
Message back from [facility]--Surgery Coordinator) with another fax number of [phone]. The Surgical Clearance Request went through this time.

## 2021-03-06 NOTE — Anesthesia Preprocedure Evaluation (Addendum)
Anesthesia Evaluation  Patient identified by MRN, date of birth, ID band Patient awake    Reviewed: Allergy & Precautions, NPO status , Patient's Chart, lab work & pertinent test results, reviewed documented beta blocker date and time   History of Anesthesia Complications (+) PONV and history of anesthetic complications  Airway Mallampati: III  TM Distance: >3 FB Neck ROM: Full    Dental  (+) Teeth Intact, Dental Advisory Given   Pulmonary asthma , sleep apnea and Continuous Positive Airway Pressure Ventilation ,    Pulmonary exam normal breath sounds clear to auscultation       Cardiovascular hypertension, Pt. on home beta blockers and Pt. on medications (-) anginaNormal cardiovascular exam+ pacemaker  Rhythm:Regular Rate:Normal     Neuro/Psych  Headaches, PSYCHIATRIC DISORDERS Anxiety Depression    GI/Hepatic Neg liver ROS, GERD  ,  Endo/Other  diabetes, Type 2, Insulin Dependent, Oral Hypoglycemic AgentsMorbid obesity  Renal/GU negative Renal ROS     Musculoskeletal  (+) Arthritis , Fibromyalgia -  Abdominal   Peds  Hematology negative hematology ROS (+)   Anesthesia Other Findings Day of surgery medications reviewed with the patient.  Reproductive/Obstetrics                            Anesthesia Physical Anesthesia Plan  ASA: 3  Anesthesia Plan: General   Post-op Pain Management:  Regional for Post-op pain   Induction: Intravenous  PONV Risk Score and Plan: 4 or greater and Scopolamine patch - Pre-op, Midazolam, Dexamethasone and Ondansetron  Airway Management Planned: Oral ETT  Additional Equipment:   Intra-op Plan:   Post-operative Plan: Extubation in OR  Informed Consent: I have reviewed the patients History and Physical, chart, labs and discussed the procedure including the risks, benefits and alternatives for the proposed anesthesia with the patient or authorized  representative who has indicated his/her understanding and acceptance.     Dental advisory given  Plan Discussed with: CRNA  Anesthesia Plan Comments: (See PAT note 02/27/2021, Konrad Felix, PA-C)       Anesthesia Quick Evaluation

## 2021-03-07 ENCOUNTER — Ambulatory Visit (HOSPITAL_COMMUNITY): Payer: Medicare PPO | Admitting: Anesthesiology

## 2021-03-07 ENCOUNTER — Ambulatory Visit (HOSPITAL_COMMUNITY)
Admission: RE | Admit: 2021-03-07 | Discharge: 2021-03-07 | Disposition: A | Discharge status: Home / Self Care | Payer: Medicare PPO | Attending: Orthopedic Surgery | Admitting: Orthopedic Surgery

## 2021-03-07 ENCOUNTER — Ambulatory Visit (HOSPITAL_COMMUNITY): Payer: Medicare PPO

## 2021-03-07 ENCOUNTER — Encounter (HOSPITAL_COMMUNITY): Payer: Self-pay | Admitting: Orthopedic Surgery

## 2021-03-07 ENCOUNTER — Ambulatory Visit (HOSPITAL_COMMUNITY): Payer: Medicare PPO | Admitting: Physician Assistant

## 2021-03-07 ENCOUNTER — Encounter (HOSPITAL_COMMUNITY)
Admission: RE | Disposition: A | Discharge status: Home / Self Care | Payer: Self-pay | Source: Home / Self Care | Attending: Orthopedic Surgery

## 2021-03-07 DIAGNOSIS — E559 Vitamin D deficiency, unspecified: Secondary | ICD-10-CM

## 2021-03-07 DIAGNOSIS — Z79899 Other long term (current) drug therapy: Secondary | ICD-10-CM | POA: Diagnosis not present

## 2021-03-07 DIAGNOSIS — Z7984 Long term (current) use of oral hypoglycemic drugs: Secondary | ICD-10-CM | POA: Diagnosis not present

## 2021-03-07 DIAGNOSIS — Z79891 Long term (current) use of opiate analgesic: Secondary | ICD-10-CM | POA: Insufficient documentation

## 2021-03-07 DIAGNOSIS — Z888 Allergy status to other drugs, medicaments and biological substances status: Secondary | ICD-10-CM | POA: Diagnosis not present

## 2021-03-07 DIAGNOSIS — M75101 Unspecified rotator cuff tear or rupture of right shoulder, not specified as traumatic: Secondary | ICD-10-CM | POA: Insufficient documentation

## 2021-03-07 DIAGNOSIS — Z882 Allergy status to sulfonamides status: Secondary | ICD-10-CM | POA: Diagnosis not present

## 2021-03-07 DIAGNOSIS — Z853 Personal history of malignant neoplasm of breast: Secondary | ICD-10-CM | POA: Insufficient documentation

## 2021-03-07 DIAGNOSIS — Z96611 Presence of right artificial shoulder joint: Secondary | ICD-10-CM

## 2021-03-07 DIAGNOSIS — Z950 Presence of cardiac pacemaker: Secondary | ICD-10-CM | POA: Diagnosis not present

## 2021-03-07 DIAGNOSIS — Z881 Allergy status to other antibiotic agents status: Secondary | ICD-10-CM | POA: Diagnosis not present

## 2021-03-07 HISTORY — PX: REVERSE SHOULDER ARTHROPLASTY: SHX5054

## 2021-03-07 LAB — GLUCOSE, CAPILLARY
Glucose-Capillary: 135 mg/dL — ABNORMAL HIGH (ref 70–99)
Glucose-Capillary: 131 mg/dL — ABNORMAL HIGH (ref 70–99)

## 2021-03-07 LAB — TYPE AND SCREEN
ABO/RH(D): O POS
Antibody Screen: NEGATIVE

## 2021-03-07 LAB — ABO/RH: ABO/RH(D): O POS

## 2021-03-07 SURGERY — ARTHROPLASTY, SHOULDER, TOTAL, REVERSE
Anesthesia: General | Site: Shoulder | Laterality: Right

## 2021-03-07 MED ORDER — DEXAMETHASONE SODIUM PHOSPHATE 10 MG/ML IJ SOLN
INTRAMUSCULAR | Status: DC | PRN
  Administered 2021-03-07: 10 mg via INTRAVENOUS

## 2021-03-07 MED ORDER — BUPIVACAINE HCL (PF) 0.5 % IJ SOLN
INTRAMUSCULAR | Status: DC | PRN
  Administered 2021-03-07: 10 mL via PERINEURAL

## 2021-03-07 MED ORDER — ONDANSETRON HCL 4 MG/2ML IJ SOLN: 4.0000 mg | Freq: Once | INTRAMUSCULAR | Status: DC | PRN

## 2021-03-07 MED ORDER — PHENYLEPHRINE HCL (PRESSORS) 10 MG/ML IV SOLN
INTRAVENOUS | Status: AC
  Filled 2021-03-07: qty 1

## 2021-03-07 MED ORDER — FENTANYL CITRATE (PF) 100 MCG/2ML IJ SOLN
50.0000 ug | INTRAMUSCULAR | Status: AC
  Administered 2021-03-07: 50 ug via INTRAVENOUS
  Filled 2021-03-07: qty 2

## 2021-03-07 MED ORDER — LIDOCAINE HCL (CARDIAC) PF 100 MG/5ML IV SOSY
PREFILLED_SYRINGE | INTRAVENOUS | Status: DC | PRN
  Administered 2021-03-07: 60 mg via INTRAVENOUS

## 2021-03-07 MED ORDER — ACETAMINOPHEN 500 MG PO TABS
1000.0000 mg | ORAL_TABLET | Freq: Once | ORAL | Status: AC
  Administered 2021-03-07: 1000 mg via ORAL
  Filled 2021-03-07: qty 2

## 2021-03-07 MED ORDER — TRANEXAMIC ACID-NACL 1000-0.7 MG/100ML-% IV SOLN
1000.0000 mg | INTRAVENOUS | Status: AC
  Administered 2021-03-07: 1000 mg via INTRAVENOUS
  Filled 2021-03-07: qty 100

## 2021-03-07 MED ORDER — SODIUM CHLORIDE 0.9 % IR SOLN
Status: DC | PRN
  Administered 2021-03-07: 1000 mL

## 2021-03-07 MED ORDER — ROCURONIUM BROMIDE 10 MG/ML (PF) SYRINGE
PREFILLED_SYRINGE | INTRAVENOUS | Status: AC
  Filled 2021-03-07: qty 10

## 2021-03-07 MED ORDER — DEXAMETHASONE SODIUM PHOSPHATE 10 MG/ML IJ SOLN
INTRAMUSCULAR | Status: AC
  Filled 2021-03-07: qty 1

## 2021-03-07 MED ORDER — MIDAZOLAM HCL 2 MG/2ML IJ SOLN
1.0000 mg | INTRAMUSCULAR | Status: AC
  Administered 2021-03-07: 1 mg via INTRAVENOUS
  Filled 2021-03-07: qty 2

## 2021-03-07 MED ORDER — LIDOCAINE 2% (20 MG/ML) 5 ML SYRINGE
INTRAMUSCULAR | Status: AC
  Filled 2021-03-07: qty 5

## 2021-03-07 MED ORDER — SUGAMMADEX SODIUM 200 MG/2ML IV SOLN
INTRAVENOUS | Status: DC | PRN
  Administered 2021-03-07: 200 mg via INTRAVENOUS

## 2021-03-07 MED ORDER — ONDANSETRON HCL 4 MG/2ML IJ SOLN
INTRAMUSCULAR | Status: AC
  Filled 2021-03-07: qty 2

## 2021-03-07 MED ORDER — ONDANSETRON HCL 4 MG/2ML IJ SOLN
INTRAMUSCULAR | Status: DC | PRN
  Administered 2021-03-07: 4 mg via INTRAVENOUS

## 2021-03-07 MED ORDER — FENTANYL CITRATE (PF) 100 MCG/2ML IJ SOLN: 25.0000 ug | INTRAMUSCULAR | Status: DC | PRN

## 2021-03-07 MED ORDER — SCOPOLAMINE 1 MG/3DAYS TD PT72
1.0000 | MEDICATED_PATCH | Freq: Once | TRANSDERMAL | Status: DC
  Administered 2021-03-07: 1.5 mg via TRANSDERMAL
  Filled 2021-03-07: qty 1

## 2021-03-07 MED ORDER — BUPIVACAINE LIPOSOME 1.3 % IJ SUSP
INTRAMUSCULAR | Status: DC | PRN
  Administered 2021-03-07: 10 mL via PERINEURAL

## 2021-03-07 MED ORDER — PROPOFOL 10 MG/ML IV BOLUS
INTRAVENOUS | Status: DC | PRN
  Administered 2021-03-07: 150 mg via INTRAVENOUS

## 2021-03-07 MED ORDER — PHENYLEPHRINE HCL-NACL 10-0.9 MG/250ML-% IV SOLN
INTRAVENOUS | Status: DC | PRN
  Administered 2021-03-07: 25 ug/min via INTRAVENOUS

## 2021-03-07 MED ORDER — ROCURONIUM BROMIDE 100 MG/10ML IV SOLN
INTRAVENOUS | Status: DC | PRN
  Administered 2021-03-07: 60 mg via INTRAVENOUS

## 2021-03-07 MED ORDER — LACTATED RINGERS IV SOLN: INTRAVENOUS | Status: DC

## 2021-03-07 MED ORDER — SODIUM CHLORIDE 0.9 % IV SOLN
2.0000 g | INTRAVENOUS | Status: AC
  Administered 2021-03-07: 2 g via INTRAVENOUS
  Filled 2021-03-07: qty 2

## 2021-03-07 MED ORDER — 0.9 % SODIUM CHLORIDE (POUR BTL) OPTIME
TOPICAL | Status: DC | PRN
  Administered 2021-03-07: 1000 mL

## 2021-03-07 SURGICAL SUPPLY — 78 items
BAG COUNTER SPONGE SURGICOUNT (BAG) ×2 IMPLANT
BAG SURGICOUNT SPONGE COUNTING (BAG) ×1
BAG ZIPLOCK 12X15 (MISCELLANEOUS) ×3 IMPLANT
BASEPLATE P2 COATD GLND 6.5X30 (Shoulder) ×1 IMPLANT
BIT DRILL 1.6MX128 (BIT) IMPLANT
BIT DRILL 1.6MX128MM (BIT)
BIT DRILL 2.5 DIA 127 CALI (BIT) ×3 IMPLANT
BIT DRILL 4 DIA CALIBRATED (BIT) ×3 IMPLANT
BLADE SAW SAG 73X25 THK (BLADE) ×2
BLADE SAW SGTL 73X25 THK (BLADE) ×1 IMPLANT
BOOTIES KNEE HIGH SLOAN (MISCELLANEOUS) ×6 IMPLANT
CLOSURE WOUND 1/2 X4 (GAUZE/BANDAGES/DRESSINGS) ×2
COOLER ICEMAN CLASSIC (MISCELLANEOUS) ×3 IMPLANT
COVER BACK TABLE 60X90IN (DRAPES) ×3 IMPLANT
COVER SURGICAL LIGHT HANDLE (MISCELLANEOUS) ×3 IMPLANT
DRAPE INCISE IOBAN 66X45 STRL (DRAPES) ×3 IMPLANT
DRAPE ORTHO SPLIT 77X108 STRL (DRAPES) ×4
DRAPE POUCH INSTRU U-SHP 10X18 (DRAPES) ×3 IMPLANT
DRAPE SHEET LG 3/4 BI-LAMINATE (DRAPES) ×3 IMPLANT
DRAPE SURG 17X11 SM STRL (DRAPES) ×3 IMPLANT
DRAPE SURG ORHT 6 SPLT 77X108 (DRAPES) ×2 IMPLANT
DRAPE TOP 10253 STERILE (DRAPES) ×3 IMPLANT
DRAPE U-SHAPE 47X51 STRL (DRAPES) ×3 IMPLANT
DRSG AQUACEL AG ADV 3.5X 6 (GAUZE/BANDAGES/DRESSINGS) ×3 IMPLANT
DRSG AQUACEL AG ADV 3.5X10 (GAUZE/BANDAGES/DRESSINGS) ×3 IMPLANT
DURAPREP 26ML APPLICATOR (WOUND CARE) ×6 IMPLANT
ELECT BLADE TIP CTD 4 INCH (ELECTRODE) ×3 IMPLANT
ELECT REM PT RETURN 15FT ADLT (MISCELLANEOUS) ×3 IMPLANT
GLOVE SRG 8 PF TXTR STRL LF DI (GLOVE) ×1 IMPLANT
GLOVE SURG ENC MOIS LTX SZ6.5 (GLOVE) ×3 IMPLANT
GLOVE SURG ENC MOIS LTX SZ7.5 (GLOVE) ×3 IMPLANT
GLOVE SURG UNDER POLY LF SZ6.5 (GLOVE) ×3 IMPLANT
GLOVE SURG UNDER POLY LF SZ8 (GLOVE) ×2
GOWN STRL REUS W/TWL LRG LVL3 (GOWN DISPOSABLE) ×3 IMPLANT
GOWN STRL REUS W/TWL XL LVL3 (GOWN DISPOSABLE) ×3 IMPLANT
HANDPIECE INTERPULSE COAX TIP (DISPOSABLE) ×2
HOOD PEEL AWAY FLYTE STAYCOOL (MISCELLANEOUS) ×9 IMPLANT
INSERT SMALL SOCKET 32MM (Insert) ×3 IMPLANT
KIT BASIN OR (CUSTOM PROCEDURE TRAY) ×3 IMPLANT
KIT TURNOVER KIT A (KITS) ×3 IMPLANT
MANIFOLD NEPTUNE II (INSTRUMENTS) ×3 IMPLANT
NEEDLE TROCAR POINT SZ 2 1/2 (NEEDLE) IMPLANT
NS IRRIG 1000ML POUR BTL (IV SOLUTION) ×3 IMPLANT
P2 COATDE GLNOID BSEPLT 6.5X30 (Shoulder) ×3 IMPLANT
PACK SHOULDER (CUSTOM PROCEDURE TRAY) ×3 IMPLANT
PAD COLD SHLDR WRAP-ON (PAD) ×3 IMPLANT
PROTECTOR NERVE ULNAR (MISCELLANEOUS) IMPLANT
RESTRAINT HEAD UNIVERSAL NS (MISCELLANEOUS) ×3 IMPLANT
RETRIEVER SUT HEWSON (MISCELLANEOUS) IMPLANT
SCREW BONE LOCKING RSP 5.0X30 (Screw) ×6 IMPLANT
SCREW BONE RSP LOCK 5X18 (Screw) ×1 IMPLANT
SCREW BONE RSP LOCK 5X22 (Screw) ×1 IMPLANT
SCREW BONE RSP LOCK 5X30 (Screw) ×2 IMPLANT
SCREW BONE RSP LOCKING 18MM LG (Screw) ×3 IMPLANT
SCREW BONE RSP LOCKING 5.0X32 (Screw) ×3 IMPLANT
SCREW RETAIN W/HEAD 4MM OFFSET (Shoulder) ×3 IMPLANT
SET HNDPC FAN SPRY TIP SCT (DISPOSABLE) ×1 IMPLANT
SLING ARM IMMOBILIZER LRG (SOFTGOODS) IMPLANT
SLING ARM IMMOBILIZER MED (SOFTGOODS) ×3 IMPLANT
SPONGE T-LAP 18X18 ~~LOC~~+RFID (SPONGE) ×3 IMPLANT
SPONGE T-LAP 4X18 ~~LOC~~+RFID (SPONGE) ×3 IMPLANT
STEM HUMERAL SM SHELL 12X48 (Stem) ×1 IMPLANT
STEM HUMERAL SMALL SHELL 12X48 (Stem) ×3 IMPLANT
STRIP CLOSURE SKIN 1/2X4 (GAUZE/BANDAGES/DRESSINGS) ×4 IMPLANT
SUCTION FRAZIER HANDLE 10FR (MISCELLANEOUS)
SUCTION TUBE FRAZIER 10FR DISP (MISCELLANEOUS) IMPLANT
SUPPORT WRAP ARM LG (MISCELLANEOUS) ×3 IMPLANT
SUT ETHIBOND 2 V 37 (SUTURE) ×3 IMPLANT
SUT FIBERWIRE #2 38 REV NDL BL (SUTURE)
SUT MNCRL AB 4-0 PS2 18 (SUTURE) ×3 IMPLANT
SUT VIC AB 2-0 CT1 27 (SUTURE) ×4
SUT VIC AB 2-0 CT1 TAPERPNT 27 (SUTURE) ×2 IMPLANT
SUTURE FIBERWR#2 38 REV NDL BL (SUTURE) IMPLANT
TAPE LABRALWHITE 1.5X36 (TAPE) ×3 IMPLANT
TAPE SUT LABRALTAP WHT/BLK (SUTURE) ×3 IMPLANT
TOWEL OR 17X26 10 PK STRL BLUE (TOWEL DISPOSABLE) ×3 IMPLANT
TOWEL OR NON WOVEN STRL DISP B (DISPOSABLE) ×3 IMPLANT
WATER STERILE IRR 1000ML POUR (IV SOLUTION) ×6 IMPLANT

## 2021-03-07 NOTE — Anesthesia Postprocedure Evaluation (Signed)
Anesthesia Post Note  Patient: Heidi [name]  Procedure(s) Performed: REVERSE SHOULDER ARTHROPLASTY (Right: Shoulder)     Patient location during evaluation: PACU Anesthesia Type: General Level of consciousness: awake and alert Pain management: pain level controlled Vital Signs Assessment: post-procedure vital signs reviewed and stable Respiratory status: spontaneous breathing, nonlabored ventilation, respiratory function stable and patient connected to nasal cannula oxygen Cardiovascular status: blood pressure returned to baseline and stable Postop Assessment: no apparent nausea or vomiting Anesthetic complications: no   No notable events documented.  Last Vitals:  Vitals:   03/07/21 1500 03/07/21 1515  BP: (!) 144/69 (!) 170/80  Pulse: 66   Resp: 19 20  Temp:  36.6 C  SpO2: 97%     Last Pain:  Vitals:   03/07/21 1515  TempSrc:   PainSc: 0-No pain                 Catalina Gravel

## 2021-03-07 NOTE — Anesthesia Procedure Notes (Signed)
Procedure Name: Intubation Date/Time: 03/07/2021 12:10 PM Performed by: Jari Pigg, CRNA Pre-anesthesia Checklist: Patient identified, Emergency Drugs available, Suction available and Patient being monitored Patient Re-evaluated:Patient Re-evaluated prior to induction Oxygen Delivery Method: Circle system utilized Preoxygenation: Pre-oxygenation with 100% oxygen Induction Type: IV induction Ventilation: Mask ventilation without difficulty Laryngoscope Size: Mac and 4 Grade View: Grade I Tube type: Oral Number of attempts: 1 Airway Equipment and Method: Stylet and Oral airway Placement Confirmation: ETT inserted through vocal cords under direct vision, positive ETCO2 and breath sounds checked- equal and bilateral Secured at: 22 cm Tube secured with: Tape Dental Injury: Teeth and Oropharynx as per pre-operative assessment

## 2021-03-07 NOTE — Op Note (Addendum)
Procedure(s): REVERSE SHOULDER ARTHROPLASTY Procedure Note  Heidi [name] female 65 y.o. 03/07/2021  Preoperative diagnosis: Right shoulder rotator cuff tear arthropathy  Postoperative diagnosis: Same  Procedure(s) and Anesthesia Type:    * REVERSE SHOULDER ARTHROPLASTY - Choice   Indications:  65 y.o. female  With endstage right shoulder arthritis with irrepairable rotator cuff tear. Pain and dysfunction interfered with quality of life and nonoperative treatment with activity modification, NSAIDS and injections failed.     Surgeon: Isabella Stalling   Assistants: Sheryle Hail PA-C ([address] was present and scrubbed throughout the procedure and was essential in positioning, retraction, exposure, and closure)  Anesthesia: General endotracheal anesthesia with preoperative interscalene block given by the attending anesthesiologist    Procedure Detail  REVERSE SHOULDER ARTHROPLASTY   Estimated Blood Loss:  200 mL         Drains: none  Blood Given: none          Specimens: none        Complications:  * No complications entered in OR log *         Disposition: PACU - hemodynamically stable.         Condition: stable      OPERATIVE FINDINGS:  A DJO Altivate pressfit reverse total shoulder arthroplasty was placed with a  size 12 short stem, a 32-4 glenosphere, and a +4-mm poly insert. The base plate  fixation was excellent.  PROCEDURE: The patient was identified in the preoperative holding area  where I personally marked the operative site after verifying site, side,  and procedure with the patient. An interscalene block given by  the attending anesthesiologist in the holding area and the patient was taken back to the operating room where all extremities were  carefully padded in position after general anesthesia was induced. She  was placed in a beach-chair position and the operative upper extremity was  prepped and draped in a standard sterile fashion. An  approximately 10-  cm incision was made from the tip of the coracoid process to the center  point of the humerus at the level of the axilla. Dissection was carried  down through subcutaneous tissues to the level of the cephalic vein  which was taken laterally with the deltoid. The pectoralis major was  retracted medially. The subdeltoid space was developed and the lateral  edge of the conjoined tendon was identified. The undersurface of  conjoined tendon was palpated and the musculocutaneous nerve was not in  the field. Retractor was placed underneath the conjoined and second  retractor was placed lateral into the deltoid. The circumflex humeral  artery and vessels were identified and clamped and coagulated. The  biceps tendon was absent.  The subscapularis was taken down as a peel with the underlying capsule.  The  joint was then gently externally rotated while the capsule was released  from the humeral neck around to just beyond the 6 o'clock position. At  this point, the joint was dislocated and the humeral head was presented  into the wound. The excessive osteophyte formation was removed with a  large rongeur.  The cutting guide was used to make the appropriate  head cut and the head was saved for potentially bone grafting.  The glenoid was exposed with the arm in an  abducted extended position. The anterior and posterior labrum were  completely excised and the capsule was released circumferentially to  allow for exposure of the glenoid for preparation. The 2.5 mm drill was  placed using  the guide in 5-10 inferior angulation and the tap was then advanced in the same hole. Small and large reamers were then used. The tap was then removed and the Metaglene was then screwed in with excellent purchase.  The peripheral guide was then used to drilled measured and filled peripheral locking screws. The size 32-4 glenosphere was then impacted on the [facility] taper and the central screw was placed. The  humerus was then again exposed and the diaphyseal reamers were used followed by the metaphyseal reamers. The final broach was left in place in the proximal trial was placed. The joint was reduced and with this implant it was felt that soft tissue tensioning was appropriate with excellent stability and excellent range of motion. Therefore, final humeral stem was placed press-fit.  And then the trial polyethylene inserts were tested again and the above implant was felt to be the most appropriate for final insertion. The joint was reduced taken through full range of motion and felt to be stable. Soft tissue tension was appropriate.  The joint was then copiously irrigated with pulse  lavage and the wound was then closed. The subscapularis was not repaired.  Skin was closed with 2-0 Vicryl in a deep dermal layer and 4-0  Monocryl for skin closure. Steri-Strips were applied. Sterile  dressings were then applied as well as a sling. The patient was allowed  to awaken from general anesthesia, transferred to stretcher, and taken  to recovery room in stable condition.   POSTOPERATIVE PLAN: The patient will be observed in the recovery room and if pain is well controlled with her regional block and she is hemodynamically stable I think she could go home today with family.

## 2021-03-07 NOTE — Anesthesia Procedure Notes (Signed)
Anesthesia Regional Block: Interscalene brachial plexus block   Pre-Anesthetic Checklist: , timeout performed,  Correct Patient, Correct Site, Correct Laterality,  Correct Procedure, Correct Position, site marked,  Risks and benefits discussed,  Surgical consent,  Pre-op evaluation,  At surgeon's request and post-op pain management  Laterality: Right  Prep: chloraprep       Needles:  Injection technique: Single-shot  Needle Type: Echogenic Stimulator Needle     Needle Length: 5cm  Needle Gauge: 22     Additional Needles:   Procedures:,,,, ultrasound used (permanent image in chart),,    Narrative:  Start time: 03/07/2021 1:22 PM End time: 03/07/2021 10:32 AM Injection made incrementally with aspirations every 5 mL.  Performed by: Personally  Anesthesiologist: Catalina Gravel, MD  Additional Notes: Functioning IV was confirmed and monitors were applied.  A 59mm 22ga Arrow echogenic stimulator needle was used. Sterile prep and drape, hand hygiene, and sterile gloves were used.  Negative aspiration and negative test dose prior to incremental administration of local anesthetic. The patient tolerated the procedure well.  Ultrasound guidance: relevent anatomy identified, needle position confirmed, local anesthetic spread visualized around nerve(s), vascular puncture avoided.  Image printed for medical record.

## 2021-03-07 NOTE — Transfer of Care (Signed)
Immediate Anesthesia Transfer of Care Note  Patient: KYA MAYFIELD  Procedure(s) Performed: REVERSE SHOULDER ARTHROPLASTY (Right: Shoulder)  Patient Location: PACU  Anesthesia Type:General and Regional  Level of Consciousness: drowsy and patient cooperative  Airway & Oxygen Therapy: Patient Spontanous Breathing and Patient connected to face mask oxygen  Post-op Assessment: Report given to RN and Post -op Vital signs reviewed and stable  Post vital signs: Reviewed and stable  Last Vitals:  Vitals Value Taken Time  BP 142/47 03/07/21 1345  Temp    Pulse 64 03/07/21 1347  Resp 17 03/07/21 1347  SpO2 100 % 03/07/21 1347  Vitals shown include unvalidated device data.  Last Pain:  Vitals:   03/07/21 1054  TempSrc:   PainSc: 5       Patients Stated Pain Goal: 4 (46/95/07 2257)  Complications: No notable events documented.

## 2021-03-07 NOTE — H&P (Signed)
Heidi [name] is an 65 y.o. female.   Chief Complaint: Right shoulder pain and dysfunction HPI: Right shoulder rotator cuff tear arthropathy, failed conservative management.  Indicated for surgical treatment to decrease pain and restore function.  Past Medical History:  Diagnosis Date   Anemia    takes iron supplement   Anxiety    Arthritis    Asthma    prn neb.   Cancer [facility])    right breast cancer   Depression    Diabetes mellitus    IDDM   Diverticulitis    Diverticulitis large intestine    Diverticulosis    Environmental allergies    Fibromyalgia    GERD (gastroesophageal reflux disease)    Headache(784.0)    migraines   History of MRSA infection 08/2012   right breast   Hypertension    under control with med., has been on med. x 10-15 yr.   Overactive bladder    Pneumonia    PONV (postoperative nausea and vomiting)    used scop patch 7/14-please use again per pt request   Presence of permanent cardiac pacemaker    Sjogren's disease ([place])    Sleep apnea    uses CPAP nightly    Past Surgical History:  Procedure Laterality Date   ANKLE ARTHROSCOPY Left    BIOPSY  05/15/2020   Procedure: BIOPSY;  Surgeon: Eloise Harman, DO;  [facility];  Service: Endoscopy;;  gastric    BREAST RECONSTRUCTION  05/24/2012   Procedure: BREAST RECONSTRUCTION;  Surgeon: Cristine Polio, MD;  Location: [place];  Service: Plastics;  Laterality: Right;  NO FLEX HD   BREAST RECONSTRUCTION Right 01/17/2013   Procedure: RIGHT BREAST RECONSTRUCTION WITH PLACEMENT OF TISSUE EXPANDER ;  Surgeon: Cristine Polio, MD;  Location: [place];  Service: Plastics;  Laterality: Right;   BREAST REDUCTION SURGERY Left 02/28/2013   Procedure: LEFT BREAST REDUCTION  (BREAST);  Surgeon: Cristine Polio, MD;  Location: [place];  Service: Plastics;  Laterality: Left;   CESAREAN SECTION     CHOLECYSTECTOMY     COLONOSCOPY WITH PROPOFOL N/A  08/14/2020   non-bleeding internal hemorrhoids, multiple small mouth diverticula, 5 mm polyp in descending colon, fair prep. Tubular adenoma. Surveillance in 5 years.    ESOPHAGOGASTRODUODENOSCOPY (EGD) WITH PROPOFOL N/A 05/15/2020   gastritis, reactive gastropathy. negative H.pylori. one gastric polyp. normal duodenum   INSERT / REPLACE / REMOVE PACEMAKER     MASTECTOMY Right 03/2012   POLYPECTOMY  05/15/2020   Procedure: POLYPECTOMY;  Surgeon: Eloise Harman, DO;  [facility];  Service: Endoscopy;;  antral   POLYPECTOMY  08/14/2020   Procedure: POLYPECTOMY;  Surgeon: Eloise Harman, DO;  [facility];  Service: Endoscopy;;   REMOVAL OF TISSUE EXPANDER AND PLACEMENT OF IMPLANT Right 05/10/2013   Procedure: RIGHT REMOVAL OF TISSUE EXPANDER ;  Surgeon: Cristine Polio, MD;  Location: [place];  Service: Plastics;  Laterality: Right;   SHOULDER ARTHROSCOPY Right    TISSUE EXPANDER PLACEMENT  05/24/2012   Procedure: TISSUE EXPANDER;  Surgeon: Cristine Polio, MD;  Location: [place];  Service: Plastics;  Laterality: Right;    Family History  Problem Relation Age of Onset   Colon cancer Paternal Grandmother    Colon cancer Paternal Grandfather    Social History:  reports that she has never smoked. She has never used smokeless tobacco. She reports that she does not drink alcohol and  does not use drugs.  Allergies:  Allergies  Allergen Reactions   Soap Other (See Comments)    FRAGRANCES CAUSE MIGRAINES   Allevyn Adhesive [Wound Dressings]     Other reaction(s): Unknown   Aripiprazole     Other reaction(s): hallucination   Cipro [Ciprofloxacin Hcl] Nausea And Vomiting   Ketorolac Tromethamine     Other reaction(s): confusion   Latuda [Lurasidone Hcl]     Confusion   Mixed Grasses Other (See Comments)   Pollen Extract Other (See Comments)   Prednisone     Other reaction(s): elevated blood glucose   Requip [Ropinirole]      Other reaction(s): confusion   Risperidone     Other reaction(s): hallucination   Sulfamethoxazole-Trimethoprim Hives and Itching   Vraylar [Cariprazine]     Hallucinations    Latex Itching   Sulfa Antibiotics Nausea Only and Rash    Medications Prior to Admission  Medication Sig Dispense Refill   allopurinol (ZYLOPRIM) 100 MG tablet Take 200 mg by mouth daily.     benazepril-hydrochlorthiazide (LOTENSIN HCT) 20-25 MG tablet Take 1 tablet by mouth daily.     benztropine (COGENTIN) 0.5 MG tablet Take 0.5 mg by mouth 2 (two) times daily.     Cholecalciferol 50 MCG (2000 UT) CAPS Take 1 capsule (2,000 Units total) by mouth daily. (Patient taking differently: Take 2,000 Units by mouth daily.) 30 capsule 5   clonazePAM (KLONOPIN) 0.5 MG tablet Take 0.5 mg by mouth at bedtime.     colchicine 0.6 MG tablet Take 0.6 mg by mouth daily as needed (gout flare).     cyanocobalamin (,VITAMIN B-12,) 1000 MCG/ML injection Inject 1,000 mcg into the muscle every 30 (thirty) days.     cycloSPORINE (RESTASIS) 0.05 % ophthalmic emulsion Place 1 drop into both eyes 2 (two) times daily.     DULoxetine (CYMBALTA) 60 MG capsule Take 60 mg by mouth daily. Pt takes at nite     folic acid (FOLVITE) 1 MG tablet Take 1 mg by mouth daily.     furosemide (LASIX) 20 MG tablet Take 20 mg by mouth daily.     hydroxychloroquine (PLAQUENIL) 200 MG tablet Take 400 mg by mouth daily.     lamoTRIgine (LAMICTAL) 100 MG tablet Take 100 mg by mouth 2 (two) times daily.     metFORMIN (GLUCOPHAGE) 1000 MG tablet Take 1,000 mg by mouth 2 (two) times daily with a meal.     methotrexate 2.5 MG tablet Take 10 mg by mouth once a week.     Misc. Devices (ROLLATOR ULTRA-LIGHT) MISC See admin instructions.     modafinil (PROVIGIL) 200 MG tablet Take 200 mg by mouth daily.     Multiple Vitamins-Minerals (PRESERVISION AREDS) CAPS Take 1 capsule by mouth 2 (two) times daily.     NYSTATIN powder Apply 2-3 application topically 2 (two) times  daily.     omeprazole (PRILOSEC) 40 MG capsule TAKE 1 CAPSULE BY MOUTH IN THE MORNING AND TAKE 1 CAPSULE AT BEDTIME (Patient taking differently: Take 40 mg by mouth 2 (two) times daily.) 180 capsule 1   oxybutynin (DITROPAN) 5 MG tablet Take 5 mg by mouth 3 (three) times daily.     prednisoLONE acetate (PRED FORTE) 1 % ophthalmic suspension Place 1 drop into both eyes daily as needed (dry eyes).     pregabalin (LYRICA) 100 MG capsule Take 100 mg by mouth 2 (two) times daily.     propranolol (INDERAL) 10 MG tablet Take 10 mg  by mouth 2 (two) times daily.     Buprenorphine HCl-Naloxone HCl 2-0.5 MG FILM Place 1 Film under the tongue daily.     cyclobenzaprine (FLEXERIL) 5 MG tablet Take 5 mg by mouth 3 (three) times daily as needed for pain.     methadone (DOLOPHINE) 5 MG tablet Take 2.5 mg by mouth at bedtime.     Naldemedine Tosylate (SYMPROIC) 0.2 MG TABS Take 1 tablet by mouth daily. With or without food (Patient not taking: Reported on 02/26/2021) 30 tablet 5    Results for orders placed or performed during the hospital encounter of 03/07/21 (from the past 48 hour(s))  Glucose, capillary     Status: Abnormal   Collection Time: 03/07/21 10:35 AM  Result Value Ref Range   Glucose-Capillary 135 (H) 70 - 99 mg/dL    Comment: Glucose reference range applies only to samples taken after fasting for at least 8 hours.   No results found.  Review of Systems  All other systems reviewed and are negative.  Blood pressure 128/76, pulse 65, temperature 97.8 F (36.6 C), temperature source Oral, resp. rate 16, height 5\' 6"  (1.676 m), weight 119.7 kg, last menstrual period 05/20/2012, SpO2 94 %. Physical Exam HENT:     Head: Atraumatic.  Eyes:     Extraocular Movements: Extraocular movements intact.  Cardiovascular:     Pulses: Normal pulses.  Pulmonary:     Effort: Pulmonary effort is normal.  Musculoskeletal:     Comments: Right shoulder pain with limited range of motion, neurovascularly  intact distally  Skin:    General: Skin is warm.  Neurological:     Mental Status: She is alert.  Psychiatric:        Mood and Affect: Mood normal.     Assessment/Plan Right shoulder rotator cuff tear arthropathy, failed conservative management Plan right reverse total shoulder replacement Risks / benefits of surgery discussed Consent on chart  NPO for OR Preop antibiotics   Isabella Stalling, MD 03/07/2021, 11:21 AM

## 2021-03-07 NOTE — Progress Notes (Signed)
Assisted Dr. Turk with right, ultrasound guided, interscalene  block. Side rails up, monitors on throughout procedure. See vital signs in flow sheet. Tolerated Procedure well. 

## 2021-03-07 NOTE — Discharge Instructions (Addendum)
Discharge Instructions after Reverse Total Shoulder Arthroplasty   A sling has been provided for you. You are to wear this at all times (except for bathing and dressing), until your first post operative visit with Dr. Tamera Punt. Please also wear while sleeping at night. While you bath and dress, let the arm/elbow extend straight down to stretch your elbow. Wiggle your fingers and pump your first while your in the sling to prevent hand swelling. Use ice on the shoulder intermittently over the first 48 hours after surgery. Continue to use ice or and ice machine as needed after 48 hours for pain control/swelling.  Please contact your pain management provider to discuss pain medication. We do not prescribe pain meds when patient's are on suboxone . Use your medicine liberally over the first 48 hours, and then you can begin to taper your use. You may take Extra Strength Tylenol or Tylenol only in place of the pain pills. DO NOT take ANY nonsteroidal anti-inflammatory pain medications: Advil, Motrin, Ibuprofen, Aleve, Naproxen or Naprosyn.  Take one aspirin 81mg  a day for 2 weeks after surgery, unless you have an aspirin sensitivity/allergy or asthma.  Leave your dressing on until your first follow up visit.  You may shower with the dressing.  Hold your arm as if you still have your sling on while you shower. Simply allow the water to wash over the site and then pat dry. Make sure your axilla (armpit) is completely dry after showering.    Please call [phone] during normal business hours or [phone] after hours for any problems. Including the following:  - excessive redness of the incisions - drainage for more than 4 days - fever of more than 101.5 F  *Please note that pain medications will not be refilled after hours or on weekends.

## 2021-03-08 ENCOUNTER — Encounter (HOSPITAL_COMMUNITY): Payer: Self-pay | Admitting: Orthopedic Surgery

## 2021-04-18 ENCOUNTER — Ambulatory Visit: Payer: Medicare PPO | Admitting: Gastroenterology

## 2021-04-18 ENCOUNTER — Encounter: Payer: Self-pay | Admitting: Gastroenterology

## 2021-04-18 ENCOUNTER — Other Ambulatory Visit: Payer: Self-pay

## 2021-04-18 VITALS — BP 136/72 | HR 69 | Temp 97.5°F | Ht 66.0 in | Wt 264.2 lb

## 2021-04-18 DIAGNOSIS — K219 Gastro-esophageal reflux disease without esophagitis: Secondary | ICD-10-CM

## 2021-04-18 DIAGNOSIS — D500 Iron deficiency anemia secondary to blood loss (chronic): Secondary | ICD-10-CM

## 2021-04-18 NOTE — Progress Notes (Signed)
Primary Care Physician:  Wendi Maya, NP Primary GI: Dr. Abbey Chatters   Chief Complaint  Patient presents with   Anemia    F/u   Hoarse    C/O ongoing x 7 weeks. PCP sending to ENT but wants to see if it is related to GERD   Nausea    "Some", no vomiting    HPI:   Minervia Brilla Artale is a 65 y.o. female presenting today with a history of GERD, constipation, found to have IDA late last year. EGD Sept 2021 with reactive gastropathy, negative H.pylori, and colonoscopy Dec 2021 with tubular adenoma and surveillance due in 5 years. Negative celiac serologies. Consideration for capsule study but has pacemaker. I discussed with patient in March 2022 about attempting capsule if willing vs CTE. She desired to hold off on both at that time due to financial reasons. Agreeable to Hematology referral and has been seen by Hem/Onc at [facility].    Doesn't have an appetite. Covid in July. Intermittent hoarseness. Happened last year, May 2022, and most recently July 2022 following surgery. Globus sensation. Omerpazole 40 mg BID. No late night eating. Some nausea. No vomiting. Feels full with small amount of food. Place on left side of neck that is sore. Will get choked on liquids. ENT upcoming appt.   Symproic not covered by insurance. Constipation cleared up for now.   Past Medical History:  Diagnosis Date   Anemia    takes iron supplement   Anxiety    Arthritis    Asthma    prn neb.   Cancer [facility])    right breast cancer   Depression    Diabetes mellitus    IDDM   Diverticulitis    Diverticulitis large intestine    Diverticulosis    Environmental allergies    Fibromyalgia    GERD (gastroesophageal reflux disease)    Headache(784.0)    migraines   History of MRSA infection 08/2012   right breast   Hypertension    under control with med., has been on med. x 10-15 yr.   Overactive bladder    Pneumonia    PONV (postoperative nausea and vomiting)    used scop patch 7/14-please  use again per pt request   Presence of permanent cardiac pacemaker    Sjogren's disease ([place])    Sleep apnea    uses CPAP nightly    Past Surgical History:  Procedure Laterality Date   ANKLE ARTHROSCOPY Left    BIOPSY  05/15/2020   Procedure: BIOPSY;  Surgeon: Eloise Harman, DO;  [facility];  Service: Endoscopy;;  gastric    BREAST RECONSTRUCTION  05/24/2012   Procedure: BREAST RECONSTRUCTION;  Surgeon: Cristine Polio, MD;  Location: [place];  Service: Plastics;  Laterality: Right;  NO FLEX HD   BREAST RECONSTRUCTION Right 01/17/2013   Procedure: RIGHT BREAST RECONSTRUCTION WITH PLACEMENT OF TISSUE EXPANDER ;  Surgeon: Cristine Polio, MD;  Location: [place];  Service: Plastics;  Laterality: Right;   BREAST REDUCTION SURGERY Left 02/28/2013   Procedure: LEFT BREAST REDUCTION  (BREAST);  Surgeon: Cristine Polio, MD;  Location: [place];  Service: Plastics;  Laterality: Left;   CESAREAN SECTION     CHOLECYSTECTOMY     COLONOSCOPY WITH PROPOFOL N/A 08/14/2020   non-bleeding internal hemorrhoids, multiple small mouth diverticula, 5 mm polyp in descending colon, fair prep. Tubular adenoma. Surveillance in 5 years.    ESOPHAGOGASTRODUODENOSCOPY (EGD) WITH  PROPOFOL N/A 05/15/2020   gastritis, reactive gastropathy. negative H.pylori. one gastric polyp. normal duodenum   INSERT / REPLACE / REMOVE PACEMAKER     MASTECTOMY Right 03/2012   POLYPECTOMY  05/15/2020   Procedure: POLYPECTOMY;  Surgeon: Eloise Harman, DO;  [facility];  Service: Endoscopy;;  antral   POLYPECTOMY  08/14/2020   Procedure: POLYPECTOMY;  Surgeon: Eloise Harman, DO;  [facility];  Service: Endoscopy;;   REMOVAL OF TISSUE EXPANDER AND PLACEMENT OF IMPLANT Right 05/10/2013   Procedure: RIGHT REMOVAL OF TISSUE EXPANDER ;  Surgeon: Cristine Polio, MD;  Location: [place];  Service: Plastics;  Laterality: Right;    REVERSE SHOULDER ARTHROPLASTY Right 03/07/2021   Procedure: REVERSE SHOULDER ARTHROPLASTY;  Surgeon: Tania Ade, MD;  Location: [facility];  Service: Orthopedics;  Laterality: Right;   SHOULDER ARTHROSCOPY Right    TISSUE EXPANDER PLACEMENT  05/24/2012   Procedure: TISSUE EXPANDER;  Surgeon: Cristine Polio, MD;  Location: [place];  Service: Plastics;  Laterality: Right;    Current Outpatient Medications  Medication Sig Dispense Refill   allopurinol (ZYLOPRIM) 100 MG tablet Take 200 mg by mouth daily.     benazepril-hydrochlorthiazide (LOTENSIN HCT) 20-25 MG tablet Take 1 tablet by mouth daily.     benztropine (COGENTIN) 0.5 MG tablet Take 0.5 mg by mouth 2 (two) times daily.     buprenorphine (SUBUTEX) 2 MG SUBL SL tablet daily     Buprenorphine HCl-Naloxone HCl 2-0.5 MG FILM Place 1 Film under the tongue daily.     Cholecalciferol 50 MCG (2000 UT) CAPS Take 1 capsule (2,000 Units total) by mouth daily. (Patient taking differently: Take 2,000 Units by mouth daily.) 30 capsule 5   clonazePAM (KLONOPIN) 0.5 MG tablet Take 0.5 mg by mouth at bedtime.     colchicine 0.6 MG tablet Take 0.6 mg by mouth daily as needed (gout flare).     cyanocobalamin (,VITAMIN B-12,) 1000 MCG/ML injection Inject 1,000 mcg into the muscle every 30 (thirty) days.     cyclobenzaprine (FLEXERIL) 5 MG tablet Take 5 mg by mouth 3 (three) times daily as needed for pain.     cycloSPORINE (RESTASIS) 0.05 % ophthalmic emulsion Place 1 drop into both eyes 2 (two) times daily.     DULoxetine (CYMBALTA) 60 MG capsule Take 60 mg by mouth daily. Pt takes at nite     folic acid (FOLVITE) 1 MG tablet Take 1 mg by mouth daily.     furosemide (LASIX) 20 MG tablet Take 20 mg by mouth daily.     hydroxychloroquine (PLAQUENIL) 200 MG tablet Take 400 mg by mouth daily.     lamoTRIgine (LAMICTAL) 100 MG tablet Take 100 mg by mouth 2 (two) times daily.     metFORMIN (GLUCOPHAGE) 1000 MG tablet Take 1,000 mg by mouth  2 (two) times daily with a meal.     methotrexate 2.5 MG tablet Take 10 mg by mouth once a week.     Misc. Devices (ROLLATOR ULTRA-LIGHT) MISC See admin instructions.     modafinil (PROVIGIL) 200 MG tablet Take 200 mg by mouth daily.     Multiple Vitamins-Minerals (PRESERVISION AREDS) CAPS Take 1 capsule by mouth 2 (two) times daily.     NYSTATIN powder Apply 2-3 application topically 2 (two) times daily.     omeprazole (PRILOSEC) 40 MG capsule TAKE 1 CAPSULE BY MOUTH IN THE MORNING AND TAKE 1 CAPSULE AT BEDTIME (Patient taking differently: Take 40  mg by mouth 2 (two) times daily.) 180 capsule 1   oxybutynin (DITROPAN) 5 MG tablet Take 5 mg by mouth 3 (three) times daily.     prednisoLONE acetate (PRED FORTE) 1 % ophthalmic suspension Place 1 drop into both eyes daily as needed (dry eyes).     pregabalin (LYRICA) 100 MG capsule Take 100 mg by mouth 2 (two) times daily.     propranolol (INDERAL) 10 MG tablet Take 10 mg by mouth 2 (two) times daily.     methadone (DOLOPHINE) 5 MG tablet Take 2.5 mg by mouth at bedtime. (Patient not taking: Reported on 04/18/2021)     No current facility-administered medications for this visit.    Allergies as of 04/18/2021 - Review Complete 04/18/2021  Allergen Reaction Noted   Soap Other (See Comments) 02/22/2013   Allevyn adhesive [wound dressings]  12/05/2020   Aripiprazole  12/05/2020   Cipro [ciprofloxacin hcl] Nausea And Vomiting 01/02/2021   Ketorolac tromethamine  12/05/2020   Latuda [lurasidone hcl]  02/26/2021   Mixed grasses Other (See Comments) 12/05/2020   Pollen extract Other (See Comments) 12/05/2020   Prednisone  12/05/2020   Requip [ropinirole]  12/05/2020   Risperidone  12/05/2020   Sulfamethoxazole-trimethoprim Hives and Itching 12/05/2020   Vraylar [cariprazine]  08/03/2020   Latex Itching 02/22/2013   Sulfa antibiotics Nausea Only and Rash 05/20/2012    Family History  Problem Relation Age of Onset   Colon cancer Paternal  Grandmother    Colon cancer Paternal Grandfather     Social History   Socioeconomic History   Marital status: Married    Spouse name: Not on file   Number of children: Not on file   Years of education: Not on file   Highest education level: Not on file  Occupational History   Not on file  Tobacco Use   Smoking status: Never   Smokeless tobacco: Never  Vaping Use   Vaping Use: Never used  Substance and Sexual Activity   Alcohol use: No   Drug use: No   Sexual activity: Not Currently  Other Topics Concern   Not on file  Social History Narrative   Not on file   Social Determinants of Health   Financial Resource Strain: Not on file  Food Insecurity: No Food Insecurity   Worried About Estate manager/land agent of Food in the Last Year: Never true   [place] in the Last Year: Never true  Transportation Needs: No Transportation Needs   Lack of Transportation (Medical): No   Lack of Transportation (Non-Medical): No  Physical Activity: Inactive   Days of Exercise per Week: 0 days   Minutes of Exercise per Session: 0 min  Stress: No Stress Concern Present   Feeling of Stress : Only a little  Social Connections: Not on file    Review of Systems: Gen: Denies fever, chills, anorexia. Denies fatigue, weakness, weight loss.  CV: Denies chest pain, palpitations, syncope, peripheral edema, and claudication. Resp: Denies dyspnea at rest, cough, wheezing, coughing up blood, and pleurisy. GI: see HPI  Derm: Denies rash, itching, dry skin Psych: Denies depression, anxiety, memory loss, confusion. No homicidal or suicidal ideation.  Heme: Denies bruising, bleeding, and enlarged lymph nodes.  Physical Exam: BP 136/72   Pulse 69   Temp (!) 97.5 F (36.4 C)   Ht '5\' 6"'$  (1.676 m)   Wt 264 lb 3.2 oz (119.8 kg)   LMP 05/20/2012   BMI 42.64 kg/m  General:  Alert and oriented. No distress noted. Pleasant and cooperative.  Head:  Normocephalic and atraumatic. Eyes:  Conjuctiva clear  without scleral icterus. Mouth:  mask in place Abdomen:  +BS, soft, non-tender and non-distended. No rebound or guarding. No HSM or masses noted. Msk:  Symmetrical without gross deformities. Normal posture. Extremities:  Without edema. Neurologic:  Alert and  oriented x4 Psych:  Alert and cooperative. Normal mood and affect.  ASSESSMENT: FERRELL BIESCHKE is a 65 y.o. female presenting today with a history of GERD, constipation, found to have IDA late last year. EGD Sept 2021 with reactive gastropathy, negative H.pylori, and colonoscopy Dec 2021 with tubular adenoma and surveillance due in 5 years. Negative celiac serologies. She has declined capsule and CTE due to financial reasons. Established now with Hematology, who is following for IDA.  GERD: omeprazole BID controlling symptoms but does report globus sensation and hoarseness, which started after recent shoulder surgery. 3 prior episodes. I query if she has LPR. Encouragingly, she is going to ENT in the near future. Add Pepcid as needed at night and continue dietary/behavior modifications.  Nausea: without vomiting. Suspect transient delayed gastric emptying in setting of narcotics and diabetes. Could also be contributing to increased reflux. Diet/behavior modifications, small meals, no GES unless unable to tolerate diet. EGD recently on file.   Constipation: resolved.   PLAN:  PPI BID, add Pepcid prn GERD diet/behavior modifications See ENT in future Continue follow-up with Hematology for IDA Capsule vs CTE if recurrent IDA: patient holding off for now Follow-up in 6-8 months  Annitta Needs, PhD, ANP-BC [facility]

## 2021-04-18 NOTE — Patient Instructions (Addendum)
Continue with omeprazole twice a day, 30 minutes before breakfast and dinner.  You may add Pepcid at night if needed.  Keep upcoming appointment with ENT.  We will see you in 6-8 months!  I enjoyed seeing you again today! As you know, I value our relationship and want to provide genuine, compassionate, and quality care. I welcome your feedback. If you receive a survey regarding your visit,  I greatly appreciate you taking time to fill this out. See you next time!  Annitta Needs, PhD, ANP-BC [facility]

## 2021-05-06 NOTE — Progress Notes (Signed)
[place], [place] [address]   CLINIC:  Medical Oncology/Hematology  PCP:  Wendi Maya, NP [address] [place] [address] [phone]   REASON FOR VISIT:  Follow-up for iron deficiency anemia  CURRENT THERAPY: Intermittent IV iron (last Venofer 12/28/2020)  INTERVAL HISTORY:  Heidi [name] 65 y.o. female returns for routine follow-up of iron deficiency anemia.  She was last seen by [name] on 02/04/2021.  At today's visit, she reports feeling fair.  She had right-sided shoulder surgery in July and also had a COVID infection following surgery.  She has also had recurrent throat infections (thrush, step throat, unknown bacterial infection), and has been on four different antibiotics in the past few months.  She reports that she has had decreased energy and persistent fatigue as well as "brain fog" ever since her COVID infection in July.  She reports ice cravings, which she thinks may or may not be related to her throat infections.  She denies any chest pain, dyspnea on exertion, syncope, or dizziness.  No major bleeding events such as epistaxis, hematochezia, or melena.  Other complaints noted in ROS are unrelated to today's visit.  She has 25% energy and 100% appetite. She endorses that she is maintaining a stable weight.    REVIEW OF SYSTEMS:  Review of Systems  Constitutional:  Positive for fatigue. Negative for appetite change, chills, diaphoresis, fever and unexpected weight change.  HENT:   Positive for mouth sores, sore throat, trouble swallowing and voice change (following w/ ENT). Negative for lump/mass and nosebleeds.   Eyes:  Positive for eye problems (dry eyes from Sjogren's disease).  Respiratory:  Negative for cough, hemoptysis and shortness of breath.   Cardiovascular:  Positive for leg swelling. Negative for chest pain and palpitations.  Gastrointestinal:  Negative for abdominal pain, blood in stool,  constipation, diarrhea, nausea and vomiting.  Genitourinary:  Negative for hematuria.   Skin: Negative.   Neurological:  Positive for dizziness and numbness (feet). Negative for headaches and light-headedness.  Hematological:  Does not bruise/bleed easily.  Psychiatric/Behavioral:  The patient is nervous/anxious.      PAST MEDICAL/SURGICAL HISTORY:  Past Medical History:  Diagnosis Date   Anemia    takes iron supplement   Anxiety    Arthritis    Asthma    prn neb.   Cancer [facility])    right breast cancer   Depression    Diabetes mellitus    IDDM   Diverticulitis    Diverticulitis large intestine    Diverticulosis    Environmental allergies    Fibromyalgia    GERD (gastroesophageal reflux disease)    Headache(784.0)    migraines   History of MRSA infection 08/2012   right breast   Hypertension    under control with med., has been on med. x 10-15 yr.   Overactive bladder    Pneumonia    PONV (postoperative nausea and vomiting)    used scop patch 7/14-please use again per pt request   Presence of permanent cardiac pacemaker    Sjogren's disease ([place])    Sleep apnea    uses CPAP nightly   Past Surgical History:  Procedure Laterality Date   ANKLE ARTHROSCOPY Left    BIOPSY  05/15/2020   Procedure: BIOPSY;  Surgeon: Eloise Harman, DO;  [facility];  Service: Endoscopy;;  gastric    BREAST RECONSTRUCTION  05/24/2012   Procedure: BREAST RECONSTRUCTION;  Surgeon: Berneta Sages  Towanda Malkin, MD;  Location: [facility];  Service: Plastics;  Laterality: Right;  NO FLEX HD   BREAST RECONSTRUCTION Right 01/17/2013   Procedure: RIGHT BREAST RECONSTRUCTION WITH PLACEMENT OF TISSUE EXPANDER ;  Surgeon: Cristine Polio, MD;  Location: [facility];  Service: Plastics;  Laterality: Right;   BREAST REDUCTION SURGERY Left 02/28/2013   Procedure: LEFT BREAST REDUCTION  (BREAST);  Surgeon: Cristine Polio, MD;  Location: [place] [facility];  Service:  Plastics;  Laterality: Left;   CESAREAN SECTION     CHOLECYSTECTOMY     COLONOSCOPY WITH PROPOFOL N/A 08/14/2020   non-bleeding internal hemorrhoids, multiple small mouth diverticula, 5 mm polyp in descending colon, fair prep. Tubular adenoma. Surveillance in 5 years.    ESOPHAGOGASTRODUODENOSCOPY (EGD) WITH PROPOFOL N/A 05/15/2020   gastritis, reactive gastropathy. negative H.pylori. one gastric polyp. normal duodenum   INSERT / REPLACE / REMOVE PACEMAKER     MASTECTOMY Right 03/2012   POLYPECTOMY  05/15/2020   Procedure: POLYPECTOMY;  Surgeon: Eloise Harman, DO;  [facility];  Service: Endoscopy;;  antral   POLYPECTOMY  08/14/2020   Procedure: POLYPECTOMY;  Surgeon: Eloise Harman, DO;  [facility];  Service: Endoscopy;;   REMOVAL OF TISSUE EXPANDER AND PLACEMENT OF IMPLANT Right 05/10/2013   Procedure: RIGHT REMOVAL OF TISSUE EXPANDER ;  Surgeon: Cristine Polio, MD;  Location: [facility];  Service: Plastics;  Laterality: Right;   REVERSE SHOULDER ARTHROPLASTY Right 03/07/2021   Procedure: REVERSE SHOULDER ARTHROPLASTY;  Surgeon: Tania Ade, MD;  [facility];  Service: Orthopedics;  Laterality: Right;   SHOULDER ARTHROSCOPY Right    TISSUE EXPANDER PLACEMENT  05/24/2012   Procedure: TISSUE EXPANDER;  Surgeon: Cristine Polio, MD;  Location: [facility];  Service: Plastics;  Laterality: Right;     SOCIAL HISTORY:  Social History   Socioeconomic History   Marital status: Married    Spouse name: Not on file   Number of children: Not on file   Years of education: Not on file   Highest education level: Not on file  Occupational History   Not on file  Tobacco Use   Smoking status: Never   Smokeless tobacco: Never  Vaping Use   Vaping Use: Never used  Substance and Sexual Activity   Alcohol use: No   Drug use: No   Sexual activity: Not Currently  Other Topics Concern   Not on file  Social History Narrative    Not on file   Social Determinants of Health   Financial Resource Strain: Not on file  Food Insecurity: No Food Insecurity   Worried About Running Out of Food in the Last Year: Never true   [place] in the Last Year: Never true  Transportation Needs: No Transportation Needs   Lack of Transportation (Medical): No   Lack of Transportation (Non-Medical): No  Physical Activity: Inactive   Days of Exercise per Week: 0 days   Minutes of Exercise per Session: 0 min  Stress: No Stress Concern Present   Feeling of Stress : Only a little  Social Connections: Not on file  Intimate Partner Violence: Not At Risk   Fear of Current or Ex-Partner: No   Emotionally Abused: No   Physically Abused: No   Sexually Abused: No    FAMILY HISTORY:  Family History  Problem Relation Age of Onset   Colon cancer Paternal Grandmother    Colon cancer Paternal Grandfather  CURRENT MEDICATIONS:  Outpatient Encounter Medications as of 05/07/2021  Medication Sig   allopurinol (ZYLOPRIM) 100 MG tablet Take 200 mg by mouth daily.   benazepril-hydrochlorthiazide (LOTENSIN HCT) 20-25 MG tablet Take 1 tablet by mouth daily.   benztropine (COGENTIN) 0.5 MG tablet Take 0.5 mg by mouth 2 (two) times daily.   buprenorphine (SUBUTEX) 2 MG SUBL SL tablet daily   Buprenorphine HCl-Naloxone HCl 2-0.5 MG FILM Place 1 Film under the tongue daily.   Cholecalciferol 50 MCG (2000 UT) CAPS Take 1 capsule (2,000 Units total) by mouth daily. (Patient taking differently: Take 2,000 Units by mouth daily.)   clonazePAM (KLONOPIN) 0.5 MG tablet Take 0.5 mg by mouth at bedtime.   colchicine 0.6 MG tablet Take 0.6 mg by mouth daily as needed (gout flare).   cyanocobalamin (,VITAMIN B-12,) 1000 MCG/ML injection Inject 1,000 mcg into the muscle every 30 (thirty) days.   cyclobenzaprine (FLEXERIL) 5 MG tablet Take 5 mg by mouth 3 (three) times daily as needed for pain.   cycloSPORINE (RESTASIS) 0.05 % ophthalmic emulsion Place 1  drop into both eyes 2 (two) times daily.   DULoxetine (CYMBALTA) 60 MG capsule Take 60 mg by mouth daily. Pt takes at nite   folic acid (FOLVITE) 1 MG tablet Take 1 mg by mouth daily.   furosemide (LASIX) 20 MG tablet Take 20 mg by mouth daily.   hydroxychloroquine (PLAQUENIL) 200 MG tablet Take 400 mg by mouth daily.   lamoTRIgine (LAMICTAL) 100 MG tablet Take 100 mg by mouth 2 (two) times daily.   metFORMIN (GLUCOPHAGE) 1000 MG tablet Take 1,000 mg by mouth 2 (two) times daily with a meal.   methotrexate 2.5 MG tablet Take 10 mg by mouth once a week.   Misc. Devices (ROLLATOR ULTRA-LIGHT) MISC See admin instructions.   modafinil (PROVIGIL) 200 MG tablet Take 200 mg by mouth daily.   Multiple Vitamins-Minerals (PRESERVISION AREDS) CAPS Take 1 capsule by mouth 2 (two) times daily.   NYSTATIN powder Apply 2-3 application topically 2 (two) times daily.   omeprazole (PRILOSEC) 40 MG capsule TAKE 1 CAPSULE BY MOUTH IN THE MORNING AND TAKE 1 CAPSULE AT BEDTIME (Patient taking differently: Take 40 mg by mouth 2 (two) times daily.)   oxybutynin (DITROPAN) 5 MG tablet Take 5 mg by mouth 3 (three) times daily.   prednisoLONE acetate (PRED FORTE) 1 % ophthalmic suspension Place 1 drop into both eyes daily as needed (dry eyes).   pregabalin (LYRICA) 100 MG capsule Take 100 mg by mouth 2 (two) times daily.   propranolol (INDERAL) 10 MG tablet Take 10 mg by mouth 2 (two) times daily.   No facility-administered encounter medications on file as of 05/07/2021.    ALLERGIES:  Allergies  Allergen Reactions   Soap Other (See Comments)    FRAGRANCES CAUSE MIGRAINES   Allevyn Adhesive [Wound Dressings]     Other reaction(s): Unknown   Aripiprazole     Other reaction(s): hallucination   Cipro [Ciprofloxacin Hcl] Nausea And Vomiting   Ketorolac Tromethamine     Other reaction(s): confusion   Latuda [Lurasidone Hcl]     Confusion   Mixed Grasses Other (See Comments)   Pollen Extract Other (See Comments)    Prednisone     Other reaction(s): elevated blood glucose   Requip [Ropinirole]     Other reaction(s): confusion   Risperidone     Other reaction(s): hallucination   Sulfamethoxazole-Trimethoprim Hives and Itching   Vraylar [Cariprazine]     Hallucinations  Latex Itching   Sulfa Antibiotics Nausea Only and Rash     PHYSICAL EXAM:  ECOG PERFORMANCE STATUS: 2 - Symptomatic, <50% confined to bed  There were no vitals filed for this visit. There were no vitals filed for this visit. Physical Exam Constitutional:      Appearance: Normal appearance. She is obese.  HENT:     Head: Normocephalic and atraumatic.     Mouth/Throat:     Mouth: Mucous membranes are moist.  Eyes:     Extraocular Movements: Extraocular movements intact.     Pupils: Pupils are equal, round, and reactive to light.  Cardiovascular:     Rate and Rhythm: Normal rate and regular rhythm.     Pulses: Normal pulses.     Heart sounds: Normal heart sounds.  Pulmonary:     Effort: Pulmonary effort is normal.     Breath sounds: Normal breath sounds.     Comments: Decreased breath sounds, limited by patient's body habitus Abdominal:     General: Bowel sounds are normal.     Palpations: Abdomen is soft.     Tenderness: There is no abdominal tenderness.  Musculoskeletal:        General: No swelling.     Right lower leg: Edema present.     Left lower leg: Edema present.     Comments: Bilateral edema of lower extremities, left greater than right (history of left ankle and knee surgery).  Lymphadenopathy:     Cervical: No cervical adenopathy.  Skin:    General: Skin is warm and dry.  Neurological:     General: No focal deficit present.     Mental Status: She is alert and oriented to person, place, and time.  Psychiatric:        Mood and Affect: Mood normal.        Behavior: Behavior normal.     LABORATORY DATA:  I have reviewed the labs as listed.  CBC    Component Value Date/Time   WBC 9.9  02/27/2021 1410   RBC 4.34 02/27/2021 1410   HGB 12.9 02/27/2021 1410   HCT 38.1 02/27/2021 1410   PLT 268 02/27/2021 1410   MCV 87.8 02/27/2021 1410   MCH 29.7 02/27/2021 1410   MCHC 33.9 02/27/2021 1410   RDW 17.3 (H) 02/27/2021 1410   LYMPHSABS 1.8 02/27/2021 1410   MONOABS 1.0 02/27/2021 1410   EOSABS 0.1 02/27/2021 1410   BASOSABS 0.1 02/27/2021 1410   CMP Latest Ref Rng & Units 02/27/2021 12/20/2020 10/15/2020  Glucose 70 - 99 mg/dL 126(H) 121(H) 113(H)  BUN 8 - 23 mg/dL 12 13 8   Creatinine 0.44 - 1.00 mg/dL 0.83 0.83 0.88  Sodium 135 - 145 mmol/L 135 136 136  Potassium 3.5 - 5.1 mmol/L 3.2(L) 4.2 3.7  Chloride 98 - 111 mmol/L 95(L) 102 102  CO2 22 - 32 mmol/L 29 28 21(L)  Calcium 8.9 - 10.3 mg/dL 8.6(L) 9.0 8.6(L)  Total Protein 6.5 - 8.1 g/dL 6.9 6.8 -  Total Bilirubin 0.3 - 1.2 mg/dL 0.7 0.6 -  Alkaline Phos 38 - 126 U/L 109 89 -  AST 15 - 41 U/L 18 16 -  ALT 0 - 44 U/L 17 12 -    DIAGNOSTIC IMAGING:  I have independently reviewed the relevant imaging and discussed with the patient.  ASSESSMENT & PLAN: 1.  Microcytic iron deficient anemia secondary to chronic GI blood loss - Patient has had intermittent melena for the past year.  However, gastroenterology  work-up thus far has not found specific source of bleeding. - EGD on 05/15/2020 which showed gastritis and one gastric polyp. - Colonoscopy on 08/14/2020 showed nonbleeding internal hemorrhoids, diverticulosis, and one polyp. - Patient is unable to have capsule endoscopy due to implanted pacemaker.   - Most recent labs (10/14/2020) show microcytic anemia with MCV 76.4and hemoglobin 8.8.    Ferritin was 10.2 on 11/06/2020. - Unable to tolerate iron pill due to severe constipation -- Was symptomatic with severe fatigue and dyspnea on exertion and pica - somewhat improved after IV iron supplementation - Received IV Venofer x3 (last dose on 12/28/2020), with some improvement in energy - Work-up for other causes of anemia was  unremarkable: Normal SPEP/IFE/free light chains, normal B12/MMA, folate, copper; LDH normal; reticulocytes low at 2.2 likely hypoproliferation in the setting of iron deficiency; erythropoietin appropriately elevated at 119.4 - Denies any major bleeding events such as epistaxis, hematemesis, hematochezia, or melena - Symptomatic with significant fatigue (energy 25%) - Most recent labs (05/07/2021): Normal CBC with Hgb 12.0; moderate iron deficiency with ferritin 37, serum iron 40, iron saturation 12% - PLAN: IV Venofer x3 (300-300-400).  Repeat labs and RTC in 3 months.  2.  History of right breast DCIS - Patient does have a history of stage 0 breast cancer, multicentric DCIS of the right breast, s/p right mastectomy.  Pathology showed no residual disease, ER and PR status could not be assessed. Patient was on norethindrone for abnormal uterine function at the time of her diagnosis.  She did not receive chemo or radiation; she was not placed on tamoxifen or anastrozole.  Her oncologist discharged her to PCP for annual breast exams and mammograms. - Most recent mammogram in March 2022 reportedly normal - PLAN: Continue annual mammograms and breast exams with PCP.  3.  Vitamin D deficiency - Labs (12/20/2020) show decreased vitamin D at 20.74 - Prescription sent prescription sent for daily vitamin D supplementation 2000 units - Repeat vitamin D is pending - PLAN: Patient instructed to continue taking vitamin D 2000 units daily unless she hears otherwise from [place].  (If vitamin D level returns as low, we will call her to increase dose to 4000 units daily.)  4.  Vitamin B12 deficiency - Patient reports that she gets B12 injections, self-administered at home - Most recent B12 (05/07/2021): Normal vitamin B12 at 804, methylmalonic acid pending - PLAN: Continue B12 injections at home  5.  Other history - PMH:  Other PMH includes type 2 diabetes mellitus, hypertension, tachybradycardia syndrome s/p  pacemaker, Sjogren's disease, chronic pain syndrome (fibromyalgia and arthritis) on methadone, and other conditions noted elsewhere in medical record.  She is awaiting rotator cuff surgery of both shoulders.  She has chronic back pain and spinal stenosis.  She has sleep apnea, uses CPAP at home. - Social history: She lives at home with her husband.  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 She denies tobacco, alcohol, illicit drug use. - Family history:  Her family history is positive for paternal grandfather with colon cancer, paternal grandmother with ovarian cancer.   PLAN SUMMARY & DISPOSITION: -IV Venofer x3 (300-300-400) - Same-day labs and RTC in 3 months  All questions were answered. The patient knows to call the clinic with any problems, questions or concerns.  Medical decision making: Moderate  Time spent on visit: I spent 20 minutes counseling the patient face to face. The total time spent in the appointment was 30 minutes and more  than 50% was on counseling.   Harriett Rush, PA-C  05/07/2021 2:32 PM

## 2021-05-07 ENCOUNTER — Inpatient Hospital Stay (HOSPITAL_COMMUNITY): Payer: Medicare PPO | Admitting: Physician Assistant

## 2021-05-07 ENCOUNTER — Encounter (HOSPITAL_COMMUNITY): Payer: Self-pay | Admitting: Physician Assistant

## 2021-05-07 ENCOUNTER — Other Ambulatory Visit: Payer: Self-pay

## 2021-05-07 ENCOUNTER — Inpatient Hospital Stay (HOSPITAL_COMMUNITY): Payer: Medicare PPO | Attending: Hematology

## 2021-05-07 VITALS — BP 144/61 | HR 74 | Temp 98.9°F | Resp 20 | Wt 269.0 lb

## 2021-05-07 DIAGNOSIS — D509 Iron deficiency anemia, unspecified: Secondary | ICD-10-CM | POA: Insufficient documentation

## 2021-05-07 DIAGNOSIS — M3500 Sicca syndrome, unspecified: Secondary | ICD-10-CM | POA: Insufficient documentation

## 2021-05-07 DIAGNOSIS — Z170 Estrogen receptor positive status [ER+]: Secondary | ICD-10-CM | POA: Diagnosis not present

## 2021-05-07 DIAGNOSIS — Z8616 Personal history of COVID-19: Secondary | ICD-10-CM | POA: Diagnosis not present

## 2021-05-07 DIAGNOSIS — E538 Deficiency of other specified B group vitamins: Secondary | ICD-10-CM | POA: Diagnosis not present

## 2021-05-07 DIAGNOSIS — E559 Vitamin D deficiency, unspecified: Secondary | ICD-10-CM | POA: Diagnosis not present

## 2021-05-07 DIAGNOSIS — R5383 Other fatigue: Secondary | ICD-10-CM | POA: Insufficient documentation

## 2021-05-07 DIAGNOSIS — Z79899 Other long term (current) drug therapy: Secondary | ICD-10-CM | POA: Diagnosis not present

## 2021-05-07 DIAGNOSIS — D0511 Intraductal carcinoma in situ of right breast: Secondary | ICD-10-CM | POA: Diagnosis not present

## 2021-05-07 DIAGNOSIS — J45909 Unspecified asthma, uncomplicated: Secondary | ICD-10-CM | POA: Diagnosis not present

## 2021-05-07 DIAGNOSIS — M129 Arthropathy, unspecified: Secondary | ICD-10-CM | POA: Insufficient documentation

## 2021-05-07 DIAGNOSIS — D500 Iron deficiency anemia secondary to blood loss (chronic): Secondary | ICD-10-CM

## 2021-05-07 DIAGNOSIS — M797 Fibromyalgia: Secondary | ICD-10-CM | POA: Diagnosis not present

## 2021-05-07 DIAGNOSIS — E119 Type 2 diabetes mellitus without complications: Secondary | ICD-10-CM | POA: Insufficient documentation

## 2021-05-07 LAB — CBC WITH DIFFERENTIAL/PLATELET
Abs Immature Granulocytes: 0.02 K/uL (ref 0.00–0.07)
Basophils Absolute: 0 K/uL (ref 0.0–0.1)
Basophils Relative: 1 %
Eosinophils Absolute: 0.3 K/uL (ref 0.0–0.5)
Eosinophils Relative: 5 %
HCT: 36.4 % (ref 36.0–46.0)
Hemoglobin: 12 g/dL (ref 12.0–15.0)
Immature Granulocytes: 0 %
Lymphocytes Relative: 25 %
Lymphs Abs: 1.5 K/uL (ref 0.7–4.0)
MCH: 30.2 pg (ref 26.0–34.0)
MCHC: 33 g/dL (ref 30.0–36.0)
MCV: 91.5 fL (ref 80.0–100.0)
Monocytes Absolute: 0.4 K/uL (ref 0.1–1.0)
Monocytes Relative: 6 %
Neutro Abs: 3.7 K/uL (ref 1.7–7.7)
Neutrophils Relative %: 63 %
Platelets: 181 K/uL (ref 150–400)
RBC: 3.98 MIL/uL (ref 3.87–5.11)
RDW: 14.2 % (ref 11.5–15.5)
WBC: 5.9 K/uL (ref 4.0–10.5)
nRBC: 0 % (ref 0.0–0.2)

## 2021-05-07 LAB — VITAMIN D 25 HYDROXY (VIT D DEFICIENCY, FRACTURES): Vit D, 25-Hydroxy: 38.97 ng/mL (ref 30–100)

## 2021-05-07 LAB — IRON AND TIBC
Iron: 40 ug/dL (ref 28–170)
Saturation Ratios: 12 % (ref 10.4–31.8)
TIBC: 346 ug/dL (ref 250–450)
UIBC: 306 ug/dL

## 2021-05-07 LAB — FERRITIN: Ferritin: 37 ng/mL (ref 11–307)

## 2021-05-07 LAB — VITAMIN B12: Vitamin B-12: 804 pg/mL (ref 180–914)

## 2021-05-07 NOTE — Patient Instructions (Signed)
Heidi [name] at [facility] Discharge Instructions  You were seen today by [name] for your iron deficiency anemia.  Your blood levels look great, but your iron levels are low.  As we discussed, this may be due to undetected slow blood loss from your GI tract.  Or, it may be due to malabsorption of nutrients from your stomach and small intestine.  Regardless of the cause, we will continue to monitor your labs.  We recommend IV iron infusion to improve your ferritin, which may also improve your energy.  LABS: Return in 3 months for repeat labs  OTHER TESTS: None at this time  MEDICATIONS: IV Venofer x3  FOLLOW-UP APPOINTMENT: Office visit in 3 months, same day as labs   Thank you for choosing [place] at [facility] to provide your oncology and hematology care.  To afford each patient quality time with our provider, please arrive at least 15 minutes before your scheduled appointment time.   If you have a lab appointment with the [facility] please come in thru the Main Entrance and check in at the main information desk.  You need to re-schedule your appointment should you arrive 10 or more minutes late.  We strive to give you quality time with our providers, and arriving late affects you and other patients whose appointments are after yours.  Also, if you no show three or more times for appointments you may be dismissed from the clinic at the providers discretion.     Again, thank you for choosing [facility].  Our hope is that these requests will decrease the amount of time that you wait before being seen by our physicians.       _____________________________________________________________  Should you have questions after your visit to [facility], please contact our office at [phone] and follow the prompts.  Our office hours are 8:00 a.m. and 4:30 p.m. Monday - Friday.  Please note that voicemails  left after 4:00 p.m. may not be returned until the following business day.  We are closed weekends and major holidays.  You do have access to a nurse 24-7, just call the main number to the clinic [phone] and do not press any options, hold on the line and a nurse will answer the phone.    For prescription refill requests, have your pharmacy contact our office and allow 72 hours.    Due to Covid, you will need to wear a mask upon entering the hospital. If you do not have a mask, a mask will be given to you at the Main Entrance upon arrival. For doctor visits, patients may have 1 support person age 30 or older with them. For treatment visits, patients can not have anyone with them due to social distancing guidelines and our immunocompromised population.

## 2021-05-09 ENCOUNTER — Ambulatory Visit (HOSPITAL_COMMUNITY): Payer: Medicare PPO

## 2021-05-09 LAB — METHYLMALONIC ACID, SERUM: Methylmalonic Acid, Quantitative: 184 nmol/L (ref 0–378)

## 2021-05-14 ENCOUNTER — Ambulatory Visit (HOSPITAL_COMMUNITY): Payer: Medicare PPO | Admitting: Physician Assistant

## 2021-05-16 ENCOUNTER — Other Ambulatory Visit: Payer: Self-pay

## 2021-05-16 ENCOUNTER — Inpatient Hospital Stay (HOSPITAL_COMMUNITY): Payer: Medicare PPO

## 2021-05-16 VITALS — BP 102/63 | HR 58 | Temp 96.9°F | Resp 18

## 2021-05-16 DIAGNOSIS — D509 Iron deficiency anemia, unspecified: Secondary | ICD-10-CM | POA: Diagnosis not present

## 2021-05-16 DIAGNOSIS — D500 Iron deficiency anemia secondary to blood loss (chronic): Secondary | ICD-10-CM

## 2021-05-16 MED ORDER — FAMOTIDINE 20 MG PO TABS
20.0000 mg | ORAL_TABLET | Freq: Once | ORAL | Status: AC
  Administered 2021-05-16: 20 mg via ORAL
  Filled 2021-05-16: qty 1

## 2021-05-16 MED ORDER — ACETAMINOPHEN 325 MG PO TABS
650.0000 mg | ORAL_TABLET | Freq: Once | ORAL | Status: AC
  Administered 2021-05-16: 650 mg via ORAL
  Filled 2021-05-16: qty 2

## 2021-05-16 MED ORDER — IRON SUCROSE 20 MG/ML IV SOLN
300.0000 mg | Freq: Once | INTRAVENOUS | Status: AC
  Administered 2021-05-16: 300 mg via INTRAVENOUS
  Filled 2021-05-16: qty 300

## 2021-05-16 MED ORDER — SODIUM CHLORIDE 0.9 % IV SOLN: Freq: Once | INTRAVENOUS | Status: AC

## 2021-05-16 MED ORDER — LORATADINE 10 MG PO TABS
10.0000 mg | ORAL_TABLET | Freq: Once | ORAL | Status: AC
  Administered 2021-05-16: 10 mg via ORAL
  Filled 2021-05-16: qty 1

## 2021-05-16 MED ORDER — FAMOTIDINE 20 MG IN NS 100 ML IVPB: 20.0000 mg | Freq: Once | INTRAVENOUS | Status: DC

## 2021-05-16 NOTE — Patient Instructions (Signed)
[facility]  Discharge Instructions: Thank you for choosing [facility] to provide your oncology and hematology care.  If you have a lab appointment with the Cancer Center, please come in thru the Main Entrance and check in at the main information desk.  Wear comfortable clothing and clothing appropriate for easy access to any Portacath or PICC line.   We strive to give you quality time with your provider. You may need to reschedule your appointment if you arrive late (15 or more minutes).  Arriving late affects you and other patients whose appointments are after yours.  Also, if you miss three or more appointments without notifying the office, you may be dismissed from the clinic at the provider's discretion.      For prescription refill requests, have your pharmacy contact our office and allow 72 hours for refills to be completed.    Today you received the following chemotherapy and/or immunotherapy agents Venofer      To help prevent nausea and vomiting after your treatment, we encourage you to take your nausea medication as directed.  BELOW ARE SYMPTOMS THAT SHOULD BE REPORTED IMMEDIATELY: *FEVER GREATER THAN 100.4 F (38 C) OR HIGHER *CHILLS OR SWEATING *NAUSEA AND VOMITING THAT IS NOT CONTROLLED WITH YOUR NAUSEA MEDICATION *UNUSUAL SHORTNESS OF BREATH *UNUSUAL BRUISING OR BLEEDING *URINARY PROBLEMS (pain or burning when urinating, or frequent urination) *BOWEL PROBLEMS (unusual diarrhea, constipation, pain near the anus) TENDERNESS IN MOUTH AND THROAT WITH OR WITHOUT PRESENCE OF ULCERS (sore throat, sores in mouth, or a toothache) UNUSUAL RASH, SWELLING OR PAIN  UNUSUAL VAGINAL DISCHARGE OR ITCHING   Items with * indicate a potential emergency and should be followed up as soon as possible or go to the Emergency Department if any problems should occur.  Please show the CHEMOTHERAPY ALERT CARD or IMMUNOTHERAPY ALERT CARD at check-in to the Emergency  Department and triage nurse.  Should you have questions after your visit or need to cancel or reschedule your appointment, please contact [facility] [phone]  and follow the prompts.  Office hours are 8:00 a.m. to 4:30 p.m. Monday - Friday. Please note that voicemails left after 4:00 p.m. may not be returned until the following business day.  We are closed weekends and major holidays. You have access to a nurse at all times for urgent questions. Please call the main number to the clinic [phone] and follow the prompts.  For any non-urgent questions, you may also contact your provider using MyChart. We now offer e-Visits for anyone 18 and older to request care online for non-urgent symptoms. For details visit [email or web address].   Also download the MyChart app! Go to the app store, search "MyChart", open the app, select [facility], and log in with your MyChart username and password.  Due to Covid, a mask is required upon entering the hospital/clinic. If you do not have a mask, one will be given to you upon arrival. For doctor visits, patients may have 1 support person aged 18 or older with them. For treatment visits, patients cannot have anyone with them due to current Covid guidelines and our immunocompromised population.  

## 2021-05-16 NOTE — Progress Notes (Signed)
Patient presents today for Venofer infusion per providers order.  Vital signs stable.  No new complaints at this time.  Peripheral IV started and blood return noted pre and post infusion.  Venofer infusion given today per MD orders.  Stable during infusion without adverse affects.  Vital signs stable.  No complaints at this time.  Discharge from clinic ambulatory in stable condition.  Alert and oriented X 3.  Follow up with [facility] as scheduled.

## 2021-05-27 ENCOUNTER — Other Ambulatory Visit: Payer: Self-pay

## 2021-05-27 ENCOUNTER — Inpatient Hospital Stay (HOSPITAL_COMMUNITY): Payer: Medicare PPO | Attending: Hematology

## 2021-05-27 ENCOUNTER — Encounter (HOSPITAL_COMMUNITY): Payer: Self-pay

## 2021-05-27 VITALS — BP 104/48 | HR 62 | Temp 97.0°F | Resp 18

## 2021-05-27 DIAGNOSIS — Z79899 Other long term (current) drug therapy: Secondary | ICD-10-CM | POA: Diagnosis not present

## 2021-05-27 DIAGNOSIS — D509 Iron deficiency anemia, unspecified: Secondary | ICD-10-CM | POA: Insufficient documentation

## 2021-05-27 DIAGNOSIS — D500 Iron deficiency anemia secondary to blood loss (chronic): Secondary | ICD-10-CM

## 2021-05-27 MED ORDER — SODIUM CHLORIDE 0.9 % IV SOLN
300.0000 mg | Freq: Once | INTRAVENOUS | Status: AC
  Administered 2021-05-27: 300 mg via INTRAVENOUS
  Filled 2021-05-27: qty 300

## 2021-05-27 MED ORDER — LORATADINE 10 MG PO TABS
10.0000 mg | ORAL_TABLET | Freq: Once | ORAL | Status: AC
  Administered 2021-05-27: 10 mg via ORAL
  Filled 2021-05-27: qty 1

## 2021-05-27 MED ORDER — FAMOTIDINE 20 MG PO TABS
20.0000 mg | ORAL_TABLET | Freq: Once | ORAL | Status: AC
  Administered 2021-05-27: 20 mg via ORAL
  Filled 2021-05-27: qty 1

## 2021-05-27 MED ORDER — FAMOTIDINE 20 MG IN NS 100 ML IVPB: 20.0000 mg | Freq: Once | INTRAVENOUS | Status: DC

## 2021-05-27 MED ORDER — SODIUM CHLORIDE 0.9 % IV SOLN: Freq: Once | INTRAVENOUS | Status: AC

## 2021-05-27 MED ORDER — ACETAMINOPHEN 325 MG PO TABS
650.0000 mg | ORAL_TABLET | Freq: Once | ORAL | Status: AC
  Administered 2021-05-27: 650 mg via ORAL
  Filled 2021-05-27: qty 2

## 2021-05-27 NOTE — Patient Instructions (Signed)
[facility]  Discharge Instructions: Thank you for choosing [facility] to provide your oncology and hematology care.  If you have a lab appointment with the Cancer Center, please come in thru the Main Entrance and check in at the main information desk.  We strive to give you quality time with your provider. You may need to reschedule your appointment if you arrive late (15 or more minutes).  Arriving late affects you and other patients whose appointments are after yours.  Also, if you miss three or more appointments without notifying the office, you may be dismissed from the clinic at the provider's discretion.      For prescription refill requests, have your pharmacy contact our office and allow 72 hours for refills to be completed.     To help prevent nausea and vomiting after your treatment, we encourage you to take your nausea medication as directed.  BELOW ARE SYMPTOMS THAT SHOULD BE REPORTED IMMEDIATELY: *FEVER GREATER THAN 100.4 F (38 C) OR HIGHER *CHILLS OR SWEATING *NAUSEA AND VOMITING THAT IS NOT CONTROLLED WITH YOUR NAUSEA MEDICATION *UNUSUAL SHORTNESS OF BREATH *UNUSUAL BRUISING OR BLEEDING *URINARY PROBLEMS (pain or burning when urinating, or frequent urination) *BOWEL PROBLEMS (unusual diarrhea, constipation, pain near the anus) TENDERNESS IN MOUTH AND THROAT WITH OR WITHOUT PRESENCE OF ULCERS (sore throat, sores in mouth, or a toothache) UNUSUAL RASH, SWELLING OR PAIN  UNUSUAL VAGINAL DISCHARGE OR ITCHING   Items with * indicate a potential emergency and should be followed up as soon as possible or go to the Emergency Department if any problems should occur.  Should you have questions after your visit or need to cancel or reschedule your appointment, please contact [facility] [phone]  and follow the prompts.  Office hours are 8:00 a.m. to 4:30 p.m. Monday - Friday. Please note that voicemails left after 4:00 p.m. may not be  returned until the following business day.  We are closed weekends and major holidays. You have access to a nurse at all times for urgent questions. Please call the main number to the clinic [phone] and follow the prompts.  For any non-urgent questions, you may also contact your provider using MyChart. We now offer e-Visits for anyone 18 and older to request care online for non-urgent symptoms. For details visit [email or web address].   Also download the MyChart app! Go to the app store, search "MyChart", open the app, select [facility], and log in with your MyChart username and password.  Due to Covid, a mask is required upon entering the hospital/clinic. If you do not have a mask, one will be given to you upon arrival. For doctor visits, patients may have 1 support person aged 18 or older with them. For treatment visits, patients cannot have anyone with them due to current Covid guidelines and our immunocompromised population.  

## 2021-05-27 NOTE — Progress Notes (Signed)
Patient presents today for treatment per orders.  Patient tolerated treatment well with no complaints voiced.  Patient left ambulatory in stable condition.  Vital signs stable at discharge.  Follow up as scheduled.    

## 2021-05-27 NOTE — Progress Notes (Signed)
Patient presents today for IV iron infusion. Vital signs are stable. Patient denies any changes since the last iron infusion. Patient denies any complaints today. MAR reviewed and updated.

## 2021-05-30 ENCOUNTER — Other Ambulatory Visit: Payer: Self-pay | Admitting: Orthopedic Surgery

## 2021-06-03 ENCOUNTER — Inpatient Hospital Stay (HOSPITAL_COMMUNITY): Payer: Medicare PPO

## 2021-06-03 VITALS — BP 99/60 | HR 75 | Temp 96.7°F | Resp 16

## 2021-06-03 DIAGNOSIS — D500 Iron deficiency anemia secondary to blood loss (chronic): Secondary | ICD-10-CM

## 2021-06-03 DIAGNOSIS — D509 Iron deficiency anemia, unspecified: Secondary | ICD-10-CM | POA: Diagnosis not present

## 2021-06-03 MED ORDER — SODIUM CHLORIDE 0.9 % IV SOLN
400.0000 mg | Freq: Once | INTRAVENOUS | Status: AC
  Administered 2021-06-03: 400 mg via INTRAVENOUS
  Filled 2021-06-03: qty 20

## 2021-06-03 MED ORDER — ACETAMINOPHEN 325 MG PO TABS
650.0000 mg | ORAL_TABLET | Freq: Once | ORAL | Status: AC
  Administered 2021-06-03: 650 mg via ORAL
  Filled 2021-06-03: qty 2

## 2021-06-03 MED ORDER — SODIUM CHLORIDE 0.9 % IV SOLN: Freq: Once | INTRAVENOUS | Status: AC

## 2021-06-03 MED ORDER — LORATADINE 10 MG PO TABS
10.0000 mg | ORAL_TABLET | Freq: Once | ORAL | Status: AC
  Administered 2021-06-03: 10 mg via ORAL
  Filled 2021-06-03: qty 1

## 2021-06-03 MED ORDER — FAMOTIDINE 20 MG IN NS 100 ML IVPB
20.0000 mg | Freq: Once | INTRAVENOUS | Status: AC
  Administered 2021-06-03: 20 mg via INTRAVENOUS
  Filled 2021-06-03: qty 100

## 2021-06-03 NOTE — Patient Instructions (Signed)
[facility]  Discharge Instructions: Thank you for choosing [facility] to provide your oncology and hematology care.  If you have a lab appointment with the Cancer Center, please come in thru the Main Entrance and check in at the main information desk.  Wear comfortable clothing and clothing appropriate for easy access to any Portacath or PICC line.   We strive to give you quality time with your provider. You may need to reschedule your appointment if you arrive late (15 or more minutes).  Arriving late affects you and other patients whose appointments are after yours.  Also, if you miss three or more appointments without notifying the office, you may be dismissed from the clinic at the provider's discretion.      For prescription refill requests, have your pharmacy contact our office and allow 72 hours for refills to be completed.        To help prevent nausea and vomiting after your treatment, we encourage you to take your nausea medication as directed.  BELOW ARE SYMPTOMS THAT SHOULD BE REPORTED IMMEDIATELY: *FEVER GREATER THAN 100.4 F (38 C) OR HIGHER *CHILLS OR SWEATING *NAUSEA AND VOMITING THAT IS NOT CONTROLLED WITH YOUR NAUSEA MEDICATION *UNUSUAL SHORTNESS OF BREATH *UNUSUAL BRUISING OR BLEEDING *URINARY PROBLEMS (pain or burning when urinating, or frequent urination) *BOWEL PROBLEMS (unusual diarrhea, constipation, pain near the anus) TENDERNESS IN MOUTH AND THROAT WITH OR WITHOUT PRESENCE OF ULCERS (sore throat, sores in mouth, or a toothache) UNUSUAL RASH, SWELLING OR PAIN  UNUSUAL VAGINAL DISCHARGE OR ITCHING   Items with * indicate a potential emergency and should be followed up as soon as possible or go to the Emergency Department if any problems should occur.  Please show the CHEMOTHERAPY ALERT CARD or IMMUNOTHERAPY ALERT CARD at check-in to the Emergency Department and triage nurse.  Should you have questions after your visit or need to cancel  or reschedule your appointment, please contact [facility] [phone]  and follow the prompts.  Office hours are 8:00 a.m. to 4:30 p.m. Monday - Friday. Please note that voicemails left after 4:00 p.m. may not be returned until the following business day.  We are closed weekends and major holidays. You have access to a nurse at all times for urgent questions. Please call the main number to the clinic [phone] and follow the prompts.  For any non-urgent questions, you may also contact your provider using MyChart. We now offer e-Visits for anyone 18 and older to request care online for non-urgent symptoms. For details visit [email or web address].   Also download the MyChart app! Go to the app store, search "MyChart", open the app, select [facility], and log in with your MyChart username and password.  Due to Covid, a mask is required upon entering the hospital/clinic. If you do not have a mask, one will be given to you upon arrival. For doctor visits, patients may have 1 support person aged 18 or older with them. For treatment visits, patients cannot have anyone with them due to current Covid guidelines and our immunocompromised population.  

## 2021-06-03 NOTE — Progress Notes (Signed)
Patient presents today for Venofer 400 mg infusion.  Patient is in satisfactory condition with no new complaints today.  Vital signs are stable.  We will proceed with treatment per MD orders.  Patient tolerated treatment well with no complaints voiced.  Patient left ambulatory in stable condition.  Vital signs stable at discharge.  Follow up as scheduled.

## 2021-06-11 NOTE — Patient Instructions (Signed)
DUE TO COVID-19 ONLY ONE VISITOR IS ALLOWED TO COME WITH YOU AND STAY IN THE WAITING ROOM ONLY DURING PRE OP AND PROCEDURE.   **NO VISITORS ARE ALLOWED IN THE SHORT STAY AREA OR RECOVERY ROOM!!**  IF YOU WILL BE ADMITTED INTO THE HOSPITAL YOU ARE ALLOWED ONLY TWO SUPPORT PEOPLE DURING VISITATION HOURS ONLY (7 AM -8PM)   The support person(s) must pass our screening, gel in and out, and wear a mask at all times, including in the patient's room. Patients must also wear a mask when staff or their support person are in the room. Visitors GUEST BADGE MUST BE WORN VISIBLY  One adult visitor may remain with you overnight and MUST be in the room by 8 P.M.  No visitors under the age of 67. Any visitor under the age of 15 must be accompanied by an adult.        Your procedure is scheduled on: 06/20/21   Report to [facility] Main Entrance    Report to admitting at : 9:35 AM   Call this number if you have problems the morning of surgery [phone]   Do not eat food :After Midnight.   May have liquids until: 8:50 AM    day of surgery  CLEAR LIQUID DIET  Foods Allowed                                                                     Foods Excluded  Water, Black Coffee and tea, regular and decaf                             liquids that you cannot  Plain Jell-O in any flavor  (No red)                                           see through such as: Fruit ices (not with fruit pulp)                                     milk, soups, orange juice              Iced Popsicles (No red)                                    All solid food                                   Apple juices Sports drinks like Gatorade (No red) Lightly seasoned clear broth or consume(fat free) Sugar  Sample Menu Breakfast                                Lunch  Supper Cranberry juice                    Beef broth                            Chicken broth Jell-O                                      Grape juice                           Apple juice Coffee or tea                        Jell-O                                      Popsicle                                                Coffee or tea                        Coffee or tea      Complete one G2 drink the morning of surgery at: 8:50 AM     the day of surgery.    The day of surgery:  Drink ONE (1) Pre-Surgery Clear Ensure or G2 by am the morning of surgery. Drink in one sitting. Do not sip.  This drink was given to you during your hospital  pre-op appointment visit. Nothing else to drink after completing the  Pre-Surgery Clear Ensure or G2.          If you have questions, please contact your surgeon's office.     Oral Hygiene is also important to reduce your risk of infection.                                    Remember - BRUSH YOUR TEETH THE MORNING OF SURGERY WITH YOUR REGULAR TOOTHPASTE   Do NOT smoke after Midnight   Take these medicines the morning of surgery with A SIP OF WATER: pregabalin,duloxetine,lamotrigine,benztropine,ditropan,allopurinol,propanolol.  How to Manage Your Diabetes Before and After Surgery  Why is it important to control my blood sugar before and after surgery? Improving blood sugar levels before and after surgery helps healing and can limit problems. A way of improving blood sugar control is eating a healthy diet by:  Eating less sugar and carbohydrates  Increasing activity/exercise  Talking with your doctor about reaching your blood sugar goals High blood sugars (greater than 180 mg/dL) can raise your risk of infections and slow your recovery, so you will need to focus on controlling your diabetes during the weeks before surgery. Make sure that the doctor who takes care of your diabetes knows about your planned surgery including the date and location.  How do I manage my blood sugar before surgery? Check your blood sugar at least 4 times a day, starting 2 days before surgery, to  make sure that the  level is not too high or low. Check your blood sugar the morning of your surgery when you wake up and every 2 hours until you get to the Short Stay unit. If your blood sugar is less than 70 mg/dL, you will need to treat for low blood sugar: Do not take insulin. Treat a low blood sugar (less than 70 mg/dL) with  cup of clear juice (cranberry or apple), 4 glucose tablets, OR glucose gel. Recheck blood sugar in 15 minutes after treatment (to make sure it is greater than 70 mg/dL). If your blood sugar is not greater than 70 mg/dL on recheck, call [phone] for further instructions. Report your blood sugar to the short stay nurse when you get to Short Stay.  If you are admitted to the hospital after surgery: Your blood sugar will be checked by the staff and you will probably be given insulin after surgery (instead of oral diabetes medicines) to make sure you have good blood sugar levels. The goal for blood sugar control after surgery is 80-180 mg/dL.   WHAT DO I DO ABOUT MY DIABETES MEDICATION?  Do not take oral diabetes medicines (pills) the morning of surgery.  THE DAY BEFORE SURGERY, take metformin as usual       THE MORNING OF SURGERY, DO NOT TAKE ANY ORAL DIABETIC MEDICATIONS DAY OF YOUR SURGERY                              You may not have any metal on your body including hair pins, jewelry, and body piercing             Do not wear make-up, lotions, powders, perfumes/cologne, or deodorant  Do not wear nail polish including gel and S&S, artificial/acrylic nails, or any other type of covering on natural nails including finger and toenails. If you have artificial nails, gel coating, etc. that needs to be removed by a nail salon please have this removed prior to surgery or surgery may need to be canceled/ delayed if the surgeon/ anesthesia feels like they are unable to be safely monitored.   Do not shave  48 hours prior to surgery.    Do not bring valuables to the  hospital. [place].   Contacts, dentures or bridgework may not be worn into surgery.   Bring small overnight bag day of surgery.    Patients discharged on the day of surgery will not be allowed to drive home.   Special Instructions: Bring a copy of your healthcare power of attorney and living will documents         the day of surgery if you haven't scanned them before.              Please read over the following fact sheets you were given: IF YOU HAVE QUESTIONS ABOUT YOUR PRE-OP INSTRUCTIONS PLEASE CALL [phone]     [facility] - Preparing for Surgery Before surgery, you can play an important role.  Because skin is not sterile, your skin needs to be as free of germs as possible.  You can reduce the number of germs on your skin by washing with CHG (chlorahexidine gluconate) soap before surgery.  CHG is an antiseptic cleaner which kills germs and bonds with the skin to continue killing germs even after washing. Please DO NOT use if you  have an allergy to CHG or antibacterial soaps.  If your skin becomes reddened/irritated stop using the CHG and inform your nurse when you arrive at Short Stay. Do not shave (including legs and underarms) for at least 48 hours prior to the first CHG shower.  You may shave your face/neck. Please follow these instructions carefully:  1.  Shower with CHG Soap the night before surgery and the  morning of Surgery.  2.  If you choose to wash your hair, wash your hair first as usual with your  normal  shampoo.  3.  After you shampoo, rinse your hair and body thoroughly to remove the  shampoo.                           4.  Use CHG as you would any other liquid soap.  You can apply chg directly  to the skin and wash                       Gently with a scrungie or clean washcloth.  5.  Apply the CHG Soap to your body ONLY FROM THE NECK DOWN.   Do not use on face/ open                           Wound or open sores. Avoid  contact with eyes, ears mouth and genitals (private parts).                       Wash face,  Genitals (private parts) with your normal soap.             6.  Wash thoroughly, paying special attention to the area where your surgery  will be performed.  7.  Thoroughly rinse your body with warm water from the neck down.  8.  DO NOT shower/wash with your normal soap after using and rinsing off  the CHG Soap.                9.  Pat yourself dry with a clean towel.            10.  Wear clean pajamas.            11.  Place clean sheets on your bed the night of your first shower and do not  sleep with pets. Day of Surgery : Do not apply any lotions/deodorants the morning of surgery.  Please wear clean clothes to the hospital/surgery center.  FAILURE TO FOLLOW THESE INSTRUCTIONS MAY RESULT IN THE CANCELLATION OF YOUR SURGERY PATIENT SIGNATURE_________________________________  NURSE SIGNATURE__________________________________  ________________________________________________________________________   Adam Phenix  An incentive spirometer is a tool that can help keep your lungs clear and active. This tool measures how well you are filling your lungs with each breath. Taking long deep breaths may help reverse or decrease the chance of developing breathing (pulmonary) problems (especially infection) following: A long period of time when you are unable to move or be active. BEFORE THE PROCEDURE  If the spirometer includes an indicator to show your best effort, your nurse or respiratory therapist will set it to a desired goal. If possible, sit up straight or lean slightly forward. Try not to slouch. Hold the incentive spirometer in an upright position. INSTRUCTIONS FOR USE  Sit on the edge of your bed if possible, or sit up as far as you can in  bed or on a chair. Hold the incentive spirometer in an upright position. Breathe out normally. Place the mouthpiece in your mouth and seal your lips  tightly around it. Breathe in slowly and as deeply as possible, raising the piston or the ball toward the top of the column. Hold your breath for 3-5 seconds or for as long as possible. Allow the piston or ball to fall to the bottom of the column. Remove the mouthpiece from your mouth and breathe out normally. Rest for a few seconds and repeat Steps 1 through 7 at least 10 times every 1-2 hours when you are awake. Take your time and take a few normal breaths between deep breaths. The spirometer may include an indicator to show your best effort. Use the indicator as a goal to work toward during each repetition. After each set of 10 deep breaths, practice coughing to be sure your lungs are clear. If you have an incision (the cut made at the time of surgery), support your incision when coughing by placing a pillow or rolled up towels firmly against it. Once you are able to get out of bed, walk around indoors and cough well. You may stop using the incentive spirometer when instructed by your caregiver.  RISKS AND COMPLICATIONS Take your time so you do not get dizzy or light-headed. If you are in pain, you may need to take or ask for pain medication before doing incentive spirometry. It is harder to take a deep breath if you are having pain. AFTER USE Rest and breathe slowly and easily. It can be helpful to keep track of a log of your progress. Your caregiver can provide you with a simple table to help with this. If you are using the spirometer at home, follow these instructions: [place] IF:  You are having difficultly using the spirometer. You have trouble using the spirometer as often as instructed. Your pain medication is not giving enough relief while using the spirometer. You develop fever of 100.5 F (38.1 C) or higher. SEEK IMMEDIATE MEDICAL CARE IF:  You cough up bloody sputum that had not been present before. You develop fever of 102 F (38.9 C) or greater. You develop  worsening pain at or near the incision site. MAKE SURE YOU:  Understand these instructions. Will watch your condition. Will get help right away if you are not doing well or get worse. Document Released: 12/15/2006 Document Revised: 10/27/2011 Document Reviewed: 02/15/2007 [facility] Patient Information 2014 [place], [place].   ________________________________________________________________________

## 2021-06-12 ENCOUNTER — Other Ambulatory Visit: Payer: Self-pay

## 2021-06-12 ENCOUNTER — Encounter (HOSPITAL_COMMUNITY): Payer: Self-pay

## 2021-06-12 ENCOUNTER — Encounter (HOSPITAL_COMMUNITY)
Admission: RE | Admit: 2021-06-12 | Discharge: 2021-06-12 | Disposition: A | Discharge status: Home / Self Care | Payer: Medicare PPO | Source: Ambulatory Visit | Attending: Orthopedic Surgery | Admitting: Orthopedic Surgery

## 2021-06-12 DIAGNOSIS — Z01818 Encounter for other preprocedural examination: Secondary | ICD-10-CM

## 2021-06-12 DIAGNOSIS — Z794 Long term (current) use of insulin: Secondary | ICD-10-CM | POA: Insufficient documentation

## 2021-06-12 DIAGNOSIS — E118 Type 2 diabetes mellitus with unspecified complications: Secondary | ICD-10-CM | POA: Diagnosis not present

## 2021-06-12 DIAGNOSIS — M75102 Unspecified rotator cuff tear or rupture of left shoulder, not specified as traumatic: Secondary | ICD-10-CM | POA: Insufficient documentation

## 2021-06-12 DIAGNOSIS — Z7984 Long term (current) use of oral hypoglycemic drugs: Secondary | ICD-10-CM | POA: Diagnosis not present

## 2021-06-12 DIAGNOSIS — K219 Gastro-esophageal reflux disease without esophagitis: Secondary | ICD-10-CM | POA: Diagnosis not present

## 2021-06-12 DIAGNOSIS — Z01812 Encounter for preprocedural laboratory examination: Secondary | ICD-10-CM | POA: Insufficient documentation

## 2021-06-12 DIAGNOSIS — I110 Hypertensive heart disease with heart failure: Secondary | ICD-10-CM | POA: Insufficient documentation

## 2021-06-12 DIAGNOSIS — M3500 Sicca syndrome, unspecified: Secondary | ICD-10-CM | POA: Insufficient documentation

## 2021-06-12 DIAGNOSIS — E1169 Type 2 diabetes mellitus with other specified complication: Secondary | ICD-10-CM

## 2021-06-12 DIAGNOSIS — I509 Heart failure, unspecified: Secondary | ICD-10-CM | POA: Insufficient documentation

## 2021-06-12 DIAGNOSIS — Z950 Presence of cardiac pacemaker: Secondary | ICD-10-CM | POA: Diagnosis not present

## 2021-06-12 DIAGNOSIS — J45909 Unspecified asthma, uncomplicated: Secondary | ICD-10-CM | POA: Diagnosis not present

## 2021-06-12 DIAGNOSIS — Z79899 Other long term (current) drug therapy: Secondary | ICD-10-CM | POA: Diagnosis not present

## 2021-06-12 DIAGNOSIS — I2510 Atherosclerotic heart disease of native coronary artery without angina pectoris: Secondary | ICD-10-CM

## 2021-06-12 HISTORY — DX: Heart failure, unspecified: I50.9

## 2021-06-12 LAB — CBC
HCT: 35 % — ABNORMAL LOW (ref 36.0–46.0)
Hemoglobin: 11.3 g/dL — ABNORMAL LOW (ref 12.0–15.0)
MCH: 30.3 pg (ref 26.0–34.0)
MCHC: 32.3 g/dL (ref 30.0–36.0)
MCV: 93.8 fL (ref 80.0–100.0)
Platelets: 161 10*3/uL (ref 150–400)
RBC: 3.73 MIL/uL — ABNORMAL LOW (ref 3.87–5.11)
RDW: 15.9 % — ABNORMAL HIGH (ref 11.5–15.5)
WBC: 6.1 10*3/uL (ref 4.0–10.5)
nRBC: 0 % (ref 0.0–0.2)

## 2021-06-12 LAB — GLUCOSE, CAPILLARY: Glucose-Capillary: 99 mg/dL (ref 70–99)

## 2021-06-12 LAB — BASIC METABOLIC PANEL
Anion gap: 10 (ref 5–15)
BUN: 11 mg/dL (ref 8–23)
CO2: 24 mmol/L (ref 22–32)
Calcium: 8.8 mg/dL — ABNORMAL LOW (ref 8.9–10.3)
Chloride: 104 mmol/L (ref 98–111)
Creatinine, Ser: 0.77 mg/dL (ref 0.44–1.00)
GFR, Estimated: >60 mL/min (ref >60)
Glucose, Bld: 108 mg/dL — ABNORMAL HIGH (ref 70–99)
Potassium: 3.3 mmol/L — ABNORMAL LOW (ref 3.5–5.1)
Sodium: 138 mmol/L (ref 135–145)

## 2021-06-12 LAB — HEMOGLOBIN A1C
Hgb A1c MFr Bld: 5.5 % (ref 4.8–5.6)
Mean Plasma Glucose: 111.15 mg/dL

## 2021-06-12 NOTE — Progress Notes (Signed)
COVID Vaccine Completed: Yes Date COVID Vaccine completed: 2021 x 2 COVID vaccine manufacturer: [place]     PCP - Thornton Papas: NP Cardiologist - Dr. Maree Krabbe: [place]  Chest x-ray -  EKG - 10/14/20 Stress Test -  ECHO -  Cardiac Cath -  Pacemaker/ICD device last checked: 01/30/21  Sleep Study - Yes CPAP - Yes  Fasting Blood Sugar - N/A  Checks Blood Sugar ___0__ times a day  Blood Thinner Instructions: Aspirin Instructions: Last Dose:  Anesthesia review: Hx: DIA,MRSA,HTN,OSA(CPAP),Pacemaker  Patient denies shortness of breath, fever, cough and chest pain at PAT appointment   Patient verbalized understanding of instructions that were given to them at the PAT appointment. Patient was also instructed that they will need to review over the PAT instructions again at home before surgery.

## 2021-06-17 NOTE — Progress Notes (Addendum)
Anesthesia Chart Review   Case: [id number] Date/Time: 06/20/21 0934   Procedure: SHOULDER ARTHROSCOPY ROTATOR CUFF DEBRIDEMENT WITH SUBACROMIAL DECOMPRESSION, BICEPS TENOTOMY (Left)   Anesthesia type: Choice   Pre-op diagnosis: LEFT SHOULDER ROTATOR CUFF TEAR   Location: [facility]OM 07 / [facility]   Surgeons: Tania Ade, MD       DISCUSSION:65 y.o. never smoker with h/o PONV, DM II, GERD, HTN, Sjogren's disease, sleep apnea, asthma, pacemaker in place due to SSS, left shoulder rotator cuff tear scheduled for above procedure 06/20/21 with Dr. Tania Ade.   S/p reverse shoulder arthroplasty 03/07/21 with no anesthesia complications noted.   Prior to this procedure she was seen by cardiology 01/30/2021. Per OV note, "Pt appears to be stable from a cardiac standpoint with low risk for CV events associated with rotator cuff sx."  Device orders on chart.   Anticipate pt can proceed with planned procedure barring acute status change.   VS: LMP 05/20/2012   PROVIDERS: Wendi Maya, NP is PCP   Joneen Caraway, MD is Cardiology in [place], [place]: Labs reviewed: Acceptable for surgery. (all labs ordered are listed, but only abnormal results are displayed)  Labs Reviewed  CBC - Abnormal; Notable for the following components:      Result Value   RBC 3.73 (*)    Hemoglobin 11.3 (*)    HCT 35.0 (*)    RDW 15.9 (*)    All other components within normal limits  BASIC METABOLIC PANEL - Abnormal; Notable for the following components:   Potassium 3.3 (*)    Glucose, Bld 108 (*)    Calcium 8.8 (*)    All other components within normal limits  HEMOGLOBIN A1C  GLUCOSE, CAPILLARY     IMAGES:   EKG: 10/14/2020 Rate 60 bpm  Atrial-paced rhythm with prolonged AV conduction Prolonged QT Abnormal ECG NO SIGNIFICANT CHANGE SINCE LAST TRACING YESTERDAY Electronic atrial pacemaker more prominent  CV: Echo 07/01/2013  Left Ventricle: Normal cavity size and wall thickness. Ejection  fraction  is 60 - 65%. Global and segmental wall motion within normal limits.   Normal Doppler.  Past Medical History:  Diagnosis Date   Anemia    takes iron supplement   Anxiety    Arthritis    Asthma    prn neb.   Cancer [facility])    right breast cancer   CHF (congestive heart failure) (HCC)    Depression    Diabetes mellitus    IDDM   Diverticulitis    Diverticulitis large intestine    Diverticulosis    Environmental allergies    Fibromyalgia    GERD (gastroesophageal reflux disease)    Headache(784.0)    migraines   History of MRSA infection 08/2012   right breast   Hypertension    under control with med., has been on med. x 10-15 yr.   Overactive bladder    Pneumonia    PONV (postoperative nausea and vomiting)    used scop patch 7/14-please use again per pt request   Presence of permanent cardiac pacemaker    Sjogren's disease ([place])    Sleep apnea    uses CPAP nightly    Past Surgical History:  Procedure Laterality Date   ANKLE ARTHROSCOPY Left    BIOPSY  05/15/2020   Procedure: BIOPSY;  Surgeon: Eloise Harman, DO;  [facility];  Service: Endoscopy;;  gastric    BREAST RECONSTRUCTION  05/24/2012   Procedure: BREAST RECONSTRUCTION;  Surgeon: Cristine Polio, MD;  Location: [place];  Service: Plastics;  Laterality: Right;  NO FLEX HD   BREAST RECONSTRUCTION Right 01/17/2013   Procedure: RIGHT BREAST RECONSTRUCTION WITH PLACEMENT OF TISSUE EXPANDER ;  Surgeon: Cristine Polio, MD;  Location: [place];  Service: Plastics;  Laterality: Right;   BREAST REDUCTION SURGERY Left 02/28/2013   Procedure: LEFT BREAST REDUCTION  (BREAST);  Surgeon: Cristine Polio, MD;  Location: [place];  Service: Plastics;  Laterality: Left;   CESAREAN SECTION     CHOLECYSTECTOMY     COLONOSCOPY WITH PROPOFOL N/A 08/14/2020   non-bleeding internal hemorrhoids, multiple small mouth diverticula, 5 mm polyp in descending  colon, fair prep. Tubular adenoma. Surveillance in 5 years.    ESOPHAGOGASTRODUODENOSCOPY (EGD) WITH PROPOFOL N/A 05/15/2020   gastritis, reactive gastropathy. negative H.pylori. one gastric polyp. normal duodenum   INSERT / REPLACE / REMOVE PACEMAKER     MASTECTOMY Right 03/2012   NASAL SEPTUM SURGERY     POLYPECTOMY  05/15/2020   Procedure: POLYPECTOMY;  Surgeon: Eloise Harman, DO;  [facility];  Service: Endoscopy;;  antral   POLYPECTOMY  08/14/2020   Procedure: POLYPECTOMY;  Surgeon: Eloise Harman, DO;  [facility];  Service: Endoscopy;;   REMOVAL OF TISSUE EXPANDER AND PLACEMENT OF IMPLANT Right 05/10/2013   Procedure: RIGHT REMOVAL OF TISSUE EXPANDER ;  Surgeon: Cristine Polio, MD;  Location: [place];  Service: Plastics;  Laterality: Right;   REVERSE SHOULDER ARTHROPLASTY Right 03/07/2021   Procedure: REVERSE SHOULDER ARTHROPLASTY;  Surgeon: Tania Ade, MD;  [facility];  Service: Orthopedics;  Laterality: Right;   SHOULDER ARTHROSCOPY Right    TISSUE EXPANDER PLACEMENT  05/24/2012   Procedure: TISSUE EXPANDER;  Surgeon: Cristine Polio, MD;  Location: [place];  Service: Plastics;  Laterality: Right;    MEDICATIONS:  allopurinol (ZYLOPRIM) 100 MG tablet   benztropine (COGENTIN) 0.5 MG tablet   buprenorphine (SUBUTEX) 2 MG SUBL SL tablet   Buprenorphine HCl-Naloxone HCl 2-0.5 MG FILM   Cholecalciferol 50 MCG (2000 UT) CAPS   clonazePAM (KLONOPIN) 0.5 MG tablet   colchicine 0.6 MG tablet   cyanocobalamin (,VITAMIN B-12,) 1000 MCG/ML injection   cyclobenzaprine (FLEXERIL) 5 MG tablet   cycloSPORINE (RESTASIS) 0.05 % ophthalmic emulsion   DEXILANT 60 MG capsule   DULoxetine (CYMBALTA) 30 MG capsule   DULoxetine (CYMBALTA) 60 MG capsule   folic acid (FOLVITE) 1 MG tablet   furosemide (LASIX) 20 MG tablet   hydroxychloroquine (PLAQUENIL) 200 MG tablet   JANUVIA 25 MG tablet   lamoTRIgine (LAMICTAL)  100 MG tablet   metFORMIN (GLUCOPHAGE) 1000 MG tablet   methotrexate 2.5 MG tablet   Misc. Devices (ROLLATOR ULTRA-LIGHT) MISC   modafinil (PROVIGIL) 200 MG tablet   Multiple Vitamins-Minerals (PRESERVISION AREDS) CAPS   naloxone (NARCAN) nasal spray 4 mg/0.1 mL   NYSTATIN powder   omeprazole (PRILOSEC) 40 MG capsule   oxybutynin (DITROPAN) 5 MG tablet   pilocarpine (SALAGEN) 5 MG tablet   prednisoLONE acetate (PRED FORTE) 1 % ophthalmic suspension   pregabalin (LYRICA) 100 MG capsule   promethazine (PHENERGAN) 25 MG tablet   propranolol (INDERAL) 10 MG tablet   No current facility-administered medications for this encounter.     Konrad Felix [name], PA-C [facility] [phone]

## 2021-06-18 NOTE — Anesthesia Preprocedure Evaluation (Addendum)
Anesthesia Evaluation  Patient identified by MRN, date of birth, ID band Patient awake    Reviewed: Allergy & Precautions, H&P , NPO status , Patient's Chart, lab work & pertinent test results, reviewed documented beta blocker date and time   History of Anesthesia Complications (+) PONV  Airway Mallampati: II  TM Distance: >3 FB Neck ROM: Full    Dental no notable dental hx. (+) Teeth Intact, Dental Advisory Given   Pulmonary asthma , sleep apnea and Continuous Positive Airway Pressure Ventilation ,    Pulmonary exam normal breath sounds clear to auscultation       Cardiovascular hypertension, Pt. on medications and Pt. on home beta blockers + pacemaker  Rhythm:Regular Rate:Normal     Neuro/Psych  Headaches, Anxiety Depression    GI/Hepatic Neg liver ROS, GERD  Medicated,  Endo/Other  diabetes, Type 2, Oral Hypoglycemic Agents  Renal/GU negative Renal ROS  negative genitourinary   Musculoskeletal  (+) Arthritis , Osteoarthritis,  Fibromyalgia -  Abdominal   Peds  Hematology  (+) Blood dyscrasia, anemia ,   Anesthesia Other Findings   Reproductive/Obstetrics negative OB ROS                           Anesthesia Physical Anesthesia Plan  ASA: 3  Anesthesia Plan: General   Post-op Pain Management:  Regional for Post-op pain   Induction: Intravenous  PONV Risk Score and Plan: 4 or greater and Ondansetron, Dexamethasone, Midazolam and Scopolamine patch - Pre-op  Airway Management Planned: Oral ETT  Additional Equipment:   Intra-op Plan:   Post-operative Plan: Extubation in OR  Informed Consent: I have reviewed the patients History and Physical, chart, labs and discussed the procedure including the risks, benefits and alternatives for the proposed anesthesia with the patient or authorized representative who has indicated his/her understanding and acceptance.     Dental advisory  given  Plan Discussed with: CRNA  Anesthesia Plan Comments: (See PAT note 06/12/2021, Konrad Felix [name], PA-C)       Anesthesia Quick Evaluation

## 2021-06-19 NOTE — Progress Notes (Signed)
Called patient about time change for her surgery on 06/20/21. To arrive 0720 for [phone] surgery. Complete ERAS drink by 0630. She verbalizes understanding.

## 2021-06-20 ENCOUNTER — Encounter (HOSPITAL_COMMUNITY)
Admission: RE | Disposition: A | Discharge status: Home / Self Care | Payer: Self-pay | Source: Home / Self Care | Attending: Orthopedic Surgery

## 2021-06-20 ENCOUNTER — Ambulatory Visit (HOSPITAL_COMMUNITY): Payer: Medicare PPO | Admitting: Certified Registered"

## 2021-06-20 ENCOUNTER — Ambulatory Visit (HOSPITAL_COMMUNITY): Payer: Medicare PPO | Admitting: Physician Assistant

## 2021-06-20 ENCOUNTER — Ambulatory Visit (HOSPITAL_COMMUNITY)
Admission: RE | Admit: 2021-06-20 | Discharge: 2021-06-20 | Disposition: A | Discharge status: Home / Self Care | Payer: Medicare PPO | Attending: Orthopedic Surgery | Admitting: Orthopedic Surgery

## 2021-06-20 ENCOUNTER — Encounter (HOSPITAL_COMMUNITY): Payer: Self-pay | Admitting: Orthopedic Surgery

## 2021-06-20 DIAGNOSIS — Z853 Personal history of malignant neoplasm of breast: Secondary | ICD-10-CM | POA: Insufficient documentation

## 2021-06-20 DIAGNOSIS — E119 Type 2 diabetes mellitus without complications: Secondary | ICD-10-CM | POA: Insufficient documentation

## 2021-06-20 DIAGNOSIS — I110 Hypertensive heart disease with heart failure: Secondary | ICD-10-CM | POA: Insufficient documentation

## 2021-06-20 DIAGNOSIS — Z950 Presence of cardiac pacemaker: Secondary | ICD-10-CM | POA: Diagnosis not present

## 2021-06-20 DIAGNOSIS — I509 Heart failure, unspecified: Secondary | ICD-10-CM | POA: Insufficient documentation

## 2021-06-20 DIAGNOSIS — Z800 Family history of malignant neoplasm of digestive organs: Secondary | ICD-10-CM | POA: Diagnosis not present

## 2021-06-20 DIAGNOSIS — Z882 Allergy status to sulfonamides status: Secondary | ICD-10-CM | POA: Insufficient documentation

## 2021-06-20 DIAGNOSIS — M75102 Unspecified rotator cuff tear or rupture of left shoulder, not specified as traumatic: Secondary | ICD-10-CM | POA: Diagnosis present

## 2021-06-20 DIAGNOSIS — M7542 Impingement syndrome of left shoulder: Secondary | ICD-10-CM | POA: Diagnosis not present

## 2021-06-20 DIAGNOSIS — Z888 Allergy status to other drugs, medicaments and biological substances status: Secondary | ICD-10-CM | POA: Diagnosis not present

## 2021-06-20 DIAGNOSIS — Z7984 Long term (current) use of oral hypoglycemic drugs: Secondary | ICD-10-CM | POA: Diagnosis not present

## 2021-06-20 DIAGNOSIS — M659 Synovitis and tenosynovitis, unspecified: Secondary | ICD-10-CM | POA: Diagnosis not present

## 2021-06-20 DIAGNOSIS — M19012 Primary osteoarthritis, left shoulder: Secondary | ICD-10-CM | POA: Insufficient documentation

## 2021-06-20 DIAGNOSIS — Z91048 Other nonmedicinal substance allergy status: Secondary | ICD-10-CM | POA: Diagnosis not present

## 2021-06-20 DIAGNOSIS — Z881 Allergy status to other antibiotic agents status: Secondary | ICD-10-CM | POA: Insufficient documentation

## 2021-06-20 DIAGNOSIS — Z01818 Encounter for other preprocedural examination: Secondary | ICD-10-CM

## 2021-06-20 DIAGNOSIS — Z91040 Latex allergy status: Secondary | ICD-10-CM | POA: Insufficient documentation

## 2021-06-20 DIAGNOSIS — M7582 Other shoulder lesions, left shoulder: Secondary | ICD-10-CM | POA: Diagnosis not present

## 2021-06-20 DIAGNOSIS — Z79899 Other long term (current) drug therapy: Secondary | ICD-10-CM | POA: Insufficient documentation

## 2021-06-20 HISTORY — PX: SHOULDER ARTHROSCOPY WITH SUBACROMIAL DECOMPRESSION: SHX5684

## 2021-06-20 LAB — SURGICAL PCR SCREEN
MRSA, PCR: NEGATIVE
Staphylococcus aureus: POSITIVE — AB

## 2021-06-20 LAB — GLUCOSE, CAPILLARY
Glucose-Capillary: 117 mg/dL — ABNORMAL HIGH (ref 70–99)
Glucose-Capillary: 149 mg/dL — ABNORMAL HIGH (ref 70–99)

## 2021-06-20 SURGERY — SHOULDER ARTHROSCOPY WITH SUBACROMIAL DECOMPRESSION
Anesthesia: General | Site: Shoulder | Laterality: Left

## 2021-06-20 MED ORDER — ONDANSETRON HCL 4 MG/2ML IJ SOLN
INTRAMUSCULAR | Status: DC | PRN
  Administered 2021-06-20: 4 mg via INTRAVENOUS

## 2021-06-20 MED ORDER — PROPOFOL 10 MG/ML IV BOLUS
INTRAVENOUS | Status: DC | PRN
  Administered 2021-06-20: 180 mg via INTRAVENOUS

## 2021-06-20 MED ORDER — PROPOFOL 10 MG/ML IV BOLUS
INTRAVENOUS | Status: AC
  Filled 2021-06-20: qty 20

## 2021-06-20 MED ORDER — SCOPOLAMINE 1 MG/3DAYS TD PT72
MEDICATED_PATCH | TRANSDERMAL | Status: DC | PRN
  Administered 2021-06-20: 1 via TRANSDERMAL

## 2021-06-20 MED ORDER — FENTANYL CITRATE (PF) 250 MCG/5ML IJ SOLN
INTRAMUSCULAR | Status: DC | PRN
  Administered 2021-06-20: 50 ug via INTRAVENOUS

## 2021-06-20 MED ORDER — SUGAMMADEX SODIUM 500 MG/5ML IV SOLN
INTRAVENOUS | Status: AC
  Filled 2021-06-20: qty 5

## 2021-06-20 MED ORDER — ONDANSETRON HCL 4 MG/2ML IJ SOLN
INTRAMUSCULAR | Status: AC
  Filled 2021-06-20: qty 2

## 2021-06-20 MED ORDER — BUPIVACAINE-EPINEPHRINE (PF) 0.5% -1:200000 IJ SOLN
INTRAMUSCULAR | Status: DC | PRN
  Administered 2021-06-20: 15 mL via PERINEURAL

## 2021-06-20 MED ORDER — LACTATED RINGERS IV SOLN: INTRAVENOUS | Status: DC

## 2021-06-20 MED ORDER — SUGAMMADEX SODIUM 200 MG/2ML IV SOLN
INTRAVENOUS | Status: DC | PRN
  Administered 2021-06-20: 250 mg via INTRAVENOUS

## 2021-06-20 MED ORDER — ACETAMINOPHEN 500 MG PO TABS
1000.0000 mg | ORAL_TABLET | Freq: Once | ORAL | Status: AC
  Administered 2021-06-20: 1000 mg via ORAL
  Filled 2021-06-20: qty 2

## 2021-06-20 MED ORDER — METHOCARBAMOL 500 MG PO TABS: 500.0000 mg | ORAL_TABLET | Freq: Four times a day (QID) | ORAL | Status: DC | PRN

## 2021-06-20 MED ORDER — FENTANYL CITRATE (PF) 100 MCG/2ML IJ SOLN
INTRAMUSCULAR | Status: AC
  Filled 2021-06-20: qty 2

## 2021-06-20 MED ORDER — ROCURONIUM BROMIDE 10 MG/ML (PF) SYRINGE
PREFILLED_SYRINGE | INTRAVENOUS | Status: DC | PRN
  Administered 2021-06-20: 70 mg via INTRAVENOUS

## 2021-06-20 MED ORDER — SODIUM CHLORIDE 0.9 % IR SOLN
Status: DC | PRN
  Administered 2021-06-20: 3000 mL

## 2021-06-20 MED ORDER — METHOCARBAMOL 500 MG IVPB - SIMPLE MED: 500.0000 mg | Freq: Four times a day (QID) | INTRAVENOUS | Status: DC | PRN

## 2021-06-20 MED ORDER — DEXAMETHASONE SODIUM PHOSPHATE 10 MG/ML IJ SOLN
INTRAMUSCULAR | Status: AC
  Filled 2021-06-20: qty 1

## 2021-06-20 MED ORDER — HYDROMORPHONE HCL 1 MG/ML IJ SOLN: 0.2500 mg | INTRAMUSCULAR | Status: DC | PRN

## 2021-06-20 MED ORDER — ORAL CARE MOUTH RINSE: 15.0000 mL | Freq: Once | OROMUCOSAL | Status: AC

## 2021-06-20 MED ORDER — EPHEDRINE 5 MG/ML INJ
INTRAVENOUS | Status: AC
  Filled 2021-06-20: qty 5

## 2021-06-20 MED ORDER — MIDAZOLAM HCL 2 MG/2ML IJ SOLN
1.0000 mg | INTRAMUSCULAR | Status: DC
  Administered 2021-06-20: 1 mg via INTRAVENOUS
  Filled 2021-06-20: qty 2

## 2021-06-20 MED ORDER — CHLORHEXIDINE GLUCONATE 0.12 % MT SOLN
15.0000 mL | Freq: Once | OROMUCOSAL | Status: AC
Start: 2021-06-20 — End: 2021-06-20
  Administered 2021-06-20: 15 mL via OROMUCOSAL

## 2021-06-20 MED ORDER — ROCURONIUM BROMIDE 10 MG/ML (PF) SYRINGE
PREFILLED_SYRINGE | INTRAVENOUS | Status: AC
  Filled 2021-06-20: qty 10

## 2021-06-20 MED ORDER — LIDOCAINE 2% (20 MG/ML) 5 ML SYRINGE
INTRAMUSCULAR | Status: DC | PRN
Start: 2021-06-20 — End: 2021-06-20
  Administered 2021-06-20: 40 mg via INTRAVENOUS

## 2021-06-20 MED ORDER — DEXAMETHASONE SODIUM PHOSPHATE 10 MG/ML IJ SOLN
INTRAMUSCULAR | Status: DC | PRN
  Administered 2021-06-20: 4 mg via INTRAVENOUS

## 2021-06-20 MED ORDER — LIDOCAINE HCL (PF) 2 % IJ SOLN
INTRAMUSCULAR | Status: AC
  Filled 2021-06-20: qty 5

## 2021-06-20 MED ORDER — FENTANYL CITRATE PF 50 MCG/ML IJ SOSY
50.0000 ug | PREFILLED_SYRINGE | INTRAMUSCULAR | Status: DC
  Administered 2021-06-20: 50 ug via INTRAVENOUS
  Filled 2021-06-20: qty 2

## 2021-06-20 MED ORDER — BUPIVACAINE LIPOSOME 1.3 % IJ SUSP
INTRAMUSCULAR | Status: DC | PRN
  Administered 2021-06-20: 10 mL via PERINEURAL

## 2021-06-20 MED ORDER — CEFAZOLIN IN SODIUM CHLORIDE 3-0.9 GM/100ML-% IV SOLN
3.0000 g | INTRAVENOUS | Status: AC
  Administered 2021-06-20: 3 g via INTRAVENOUS
  Filled 2021-06-20: qty 100

## 2021-06-20 MED ORDER — SCOPOLAMINE 1 MG/3DAYS TD PT72
MEDICATED_PATCH | TRANSDERMAL | Status: AC
  Filled 2021-06-20: qty 1

## 2021-06-20 SURGICAL SUPPLY — 45 items
BAG COUNTER SPONGE SURGICOUNT (BAG) IMPLANT
BLADE SURG SZ11 CARB STEEL (BLADE) ×2 IMPLANT
BURR OVAL 8 FLU 4.0X13 (MISCELLANEOUS) ×2 IMPLANT
CANNULA 5.75X71 LONG (CANNULA) ×2 IMPLANT
COVER SURGICAL LIGHT HANDLE (MISCELLANEOUS) ×2 IMPLANT
CUTTER BONE 4.0MM X 13CM (MISCELLANEOUS) ×2 IMPLANT
DRAPE INCISE IOBAN 66X45 STRL (DRAPES) ×2 IMPLANT
DRAPE ORTHO SPLIT 77X108 STRL (DRAPES) ×1
DRAPE SURG IRRIG POUCH 19X23 (DRAPES) ×2 IMPLANT
DRAPE SURG ORHT 6 SPLT 77X108 (DRAPES) ×1 IMPLANT
DRAPE U-SHAPE 47X51 STRL (DRAPES) ×6 IMPLANT
DRSG PAD ABDOMINAL 8X10 ST (GAUZE/BANDAGES/DRESSINGS) ×4 IMPLANT
DRSG TEGADERM 6X8 (GAUZE/BANDAGES/DRESSINGS) ×2 IMPLANT
DURAPREP 26ML APPLICATOR (WOUND CARE) ×2 IMPLANT
ELECT REM PT RETURN 15FT ADLT (MISCELLANEOUS) IMPLANT
GAUZE SPONGE 4X4 12PLY STRL (GAUZE/BANDAGES/DRESSINGS) ×2 IMPLANT
GAUZE XEROFORM 1X8 LF (GAUZE/BANDAGES/DRESSINGS) ×2 IMPLANT
GLOVE SRG 8 PF TXTR STRL LF DI (GLOVE) ×1 IMPLANT
GLOVE SURG POLYISO LF SZ6.5 (GLOVE) ×2 IMPLANT
GLOVE SURG UNDER POLY LF SZ6.5 (GLOVE) ×2 IMPLANT
GLOVE SURG UNDER POLY LF SZ8 (GLOVE) ×1
GOWN STRL REUS W/TWL LRG LVL3 (GOWN DISPOSABLE) ×2 IMPLANT
GOWN STRL REUS W/TWL XL LVL3 (GOWN DISPOSABLE) ×2 IMPLANT
GUIDE PIN ARTHREX (INSTRUMENTS)
KIT BASIN OR (CUSTOM PROCEDURE TRAY) ×2 IMPLANT
KIT TURNOVER KIT A (KITS) ×2 IMPLANT
MANIFOLD NEPTUNE II (INSTRUMENTS) ×2 IMPLANT
NEEDLE HYPO 25X1 1.5 SAFETY (NEEDLE) IMPLANT
NEEDLE SCORPION (NEEDLE) IMPLANT
PACK SHOULDER (CUSTOM PROCEDURE TRAY) ×2 IMPLANT
PIN GUIDE ARTHREX (INSTRUMENTS) IMPLANT
PROBE BIPOLAR ATHRO 135MM 90D (MISCELLANEOUS) IMPLANT
PROTECTOR NERVE ULNAR (MISCELLANEOUS) IMPLANT
SLING ARM FOAM STRAP LRG (SOFTGOODS) ×2 IMPLANT
SLING ARM FOAM STRAP MED (SOFTGOODS) IMPLANT
SLING ARM IMMOBILIZER LRG (SOFTGOODS) IMPLANT
SLING ARM IMMOBILIZER MED (SOFTGOODS) IMPLANT
SUT ETHILON 3 0 PS 1 (SUTURE) ×2 IMPLANT
SUT PDS AB 1 CT1 27 (SUTURE) IMPLANT
SYR CONTROL 10ML LL (SYRINGE) IMPLANT
TAPE CLOTH SURG 6X10 WHT LF (GAUZE/BANDAGES/DRESSINGS) ×2 IMPLANT
TOWEL OR 17X26 10 PK STRL BLUE (TOWEL DISPOSABLE) ×2 IMPLANT
TOWEL OR NON WOVEN STRL DISP B (DISPOSABLE) IMPLANT
TUBING ARTHROSCOPY IRRIG 16FT (MISCELLANEOUS) ×2 IMPLANT
TUBING CONNECTING 10 (TUBING) ×2 IMPLANT

## 2021-06-20 NOTE — Anesthesia Procedure Notes (Signed)
Procedure Name: Intubation Date/Time: 06/20/2021 10:19 AM Performed by: Eben Burow, CRNA Pre-anesthesia Checklist: Patient identified, Emergency Drugs available, Suction available, Patient being monitored and Timeout performed Patient Re-evaluated:Patient Re-evaluated prior to induction Oxygen Delivery Method: Circle system utilized Preoxygenation: Pre-oxygenation with 100% oxygen Induction Type: IV induction Ventilation: Mask ventilation without difficulty Laryngoscope Size: Mac and 4 Tube type: Oral Tube size: 7.0 mm Number of attempts: 1 Airway Equipment and Method: Stylet Placement Confirmation: ETT inserted through vocal cords under direct vision, positive ETCO2 and breath sounds checked- equal and bilateral Secured at: 21 cm Tube secured with: Tape Dental Injury: Teeth and Oropharynx as per pre-operative assessment

## 2021-06-20 NOTE — H&P (Signed)
Heidi [name] is an 65 y.o. female.   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  Pain limits sleep and quality of life.  Failed conservative tx.   Past Medical History:  Diagnosis Date   Anemia    takes iron supplement   Anxiety    Arthritis    Asthma    prn neb.   Cancer [facility])    right breast cancer   CHF (congestive heart failure) (HCC)    Depression    Diabetes mellitus    IDDM   Diverticulitis    Diverticulitis large intestine    Diverticulosis    Environmental allergies    Fibromyalgia    GERD (gastroesophageal reflux disease)    Headache(784.0)    migraines   History of MRSA infection 08/2012   right breast   Hypertension    under control with med., has been on med. x 10-15 yr.   Overactive bladder    Pneumonia    PONV (postoperative nausea and vomiting)    used scop patch 7/14-please use again per pt request   Presence of permanent cardiac pacemaker    Sjogren's disease ([place])    Sleep apnea    uses CPAP nightly    Past Surgical History:  Procedure Laterality Date   ANKLE ARTHROSCOPY Left    BIOPSY  05/15/2020   Procedure: BIOPSY;  Surgeon: Eloise Harman, DO;  [facility];  Service: Endoscopy;;  gastric    BREAST RECONSTRUCTION  05/24/2012   Procedure: BREAST RECONSTRUCTION;  Surgeon: Cristine Polio, MD;  Location: [place];  Service: Plastics;  Laterality: Right;  NO FLEX HD   BREAST RECONSTRUCTION Right 01/17/2013   Procedure: RIGHT BREAST RECONSTRUCTION WITH PLACEMENT OF TISSUE EXPANDER ;  Surgeon: Cristine Polio, MD;  Location: [place];  Service: Plastics;  Laterality: Right;   BREAST REDUCTION SURGERY Left 02/28/2013   Procedure: LEFT BREAST REDUCTION  (BREAST);  Surgeon: Cristine Polio, MD;  Location: [place];  Service: Plastics;  Laterality: Left;   CESAREAN SECTION     CHOLECYSTECTOMY     COLONOSCOPY WITH PROPOFOL N/A 08/14/2020    non-bleeding internal hemorrhoids, multiple small mouth diverticula, 5 mm polyp in descending colon, fair prep. Tubular adenoma. Surveillance in 5 years.    ESOPHAGOGASTRODUODENOSCOPY (EGD) WITH PROPOFOL N/A 05/15/2020   gastritis, reactive gastropathy. negative H.pylori. one gastric polyp. normal duodenum   INSERT / REPLACE / REMOVE PACEMAKER     MASTECTOMY Right 03/2012   NASAL SEPTUM SURGERY     POLYPECTOMY  05/15/2020   Procedure: POLYPECTOMY;  Surgeon: Eloise Harman, DO;  [facility];  Service: Endoscopy;;  antral   POLYPECTOMY  08/14/2020   Procedure: POLYPECTOMY;  Surgeon: Eloise Harman, DO;  [facility];  Service: Endoscopy;;   REMOVAL OF TISSUE EXPANDER AND PLACEMENT OF IMPLANT Right 05/10/2013   Procedure: RIGHT REMOVAL OF TISSUE EXPANDER ;  Surgeon: Cristine Polio, MD;  Location: [place];  Service: Plastics;  Laterality: Right;   REVERSE SHOULDER ARTHROPLASTY Right 03/07/2021   Procedure: REVERSE SHOULDER ARTHROPLASTY;  Surgeon: Tania Ade, MD;  [facility];  Service: Orthopedics;  Laterality: Right;   SHOULDER ARTHROSCOPY Right    TISSUE EXPANDER PLACEMENT  05/24/2012   Procedure: TISSUE EXPANDER;  Surgeon: Cristine Polio, MD;  Location: [place];  Service: Plastics;  Laterality: Right;    Family History  Problem Relation Age  of Onset   Colon cancer Paternal Grandmother    Colon cancer Paternal Grandfather    Social History:  reports that she has never smoked. She has never used smokeless tobacco. She reports that she does not drink alcohol and does not use drugs.  Allergies:  Allergies  Allergen Reactions   Soap Other (See Comments)    FRAGRANCES CAUSE MIGRAINES   Allevyn Adhesive [Wound Dressings]     Other reaction(s): Unknown   Aripiprazole     Other reaction(s): hallucination   Cipro [Ciprofloxacin Hcl] Nausea And Vomiting   Ketorolac Tromethamine     Other reaction(s): confusion    Latuda [Lurasidone Hcl]     Confusion   Mixed Grasses Other (See Comments)   Pollen Extract Other (See Comments)   Prednisone     Other reaction(s): elevated blood glucose   Requip [Ropinirole]     Other reaction(s): confusion   Risperidone     Other reaction(s): hallucination   Sulfamethoxazole-Trimethoprim Hives and Itching   Vraylar [Cariprazine]     Hallucinations    Latex Itching   Sulfa Antibiotics Nausea Only and Rash    Medications Prior to Admission  Medication Sig Dispense Refill   allopurinol (ZYLOPRIM) 100 MG tablet Take 200 mg by mouth daily.     benztropine (COGENTIN) 0.5 MG tablet Take 0.5 mg by mouth 2 (two) times daily.     buprenorphine (SUBUTEX) 2 MG SUBL SL tablet Place 2 mg under the tongue daily.     Buprenorphine HCl-Naloxone HCl 2-0.5 MG FILM Place 1 Film under the tongue at bedtime.     Cholecalciferol 50 MCG (2000 UT) CAPS Take 1 capsule (2,000 Units total) by mouth daily. (Patient taking differently: Take 2,000 Units by mouth daily.) 30 capsule 5   clonazePAM (KLONOPIN) 0.5 MG tablet Take 0.5-1 mg by mouth at bedtime.     colchicine 0.6 MG tablet Take 0.6 mg by mouth daily as needed (gout flare).     cyanocobalamin (,VITAMIN B-12,) 1000 MCG/ML injection Inject 1,000 mcg into the muscle every 30 (thirty) days.     cyclobenzaprine (FLEXERIL) 5 MG tablet Take 5 mg by mouth 3 (three) times daily as needed for pain.     cycloSPORINE (RESTASIS) 0.05 % ophthalmic emulsion Place 1 drop into both eyes 2 (two) times daily.     DEXILANT 60 MG capsule Take 60 mg by mouth at bedtime.     DULoxetine (CYMBALTA) 30 MG capsule Take 30 mg by mouth daily.     DULoxetine (CYMBALTA) 60 MG capsule Take 60 mg by mouth at bedtime.     folic acid (FOLVITE) 1 MG tablet Take 1 mg by mouth daily.     furosemide (LASIX) 20 MG tablet Take 20 mg by mouth daily.     hydroxychloroquine (PLAQUENIL) 200 MG tablet Take 400 mg by mouth daily.     lamoTRIgine (LAMICTAL) 100 MG tablet Take 150  mg by mouth 2 (two) times daily.     metFORMIN (GLUCOPHAGE) 1000 MG tablet Take 1,000 mg by mouth 2 (two) times daily with a meal.     methotrexate 2.5 MG tablet Take 10 mg by mouth once a week. Friday     Misc. Devices (ROLLATOR ULTRA-LIGHT) MISC See admin instructions.     modafinil (PROVIGIL) 200 MG tablet Take 200 mg by mouth daily.     Multiple Vitamins-Minerals (PRESERVISION AREDS) CAPS Take 1 capsule by mouth daily. 200 mg     naloxone (NARCAN) nasal spray 4 mg/0.1  mL Place 4 mg into the nose as needed for opioid reversal.     NYSTATIN powder Apply 2-3 application topically daily as needed (Stomach).     omeprazole (PRILOSEC) 40 MG capsule TAKE 1 CAPSULE BY MOUTH IN THE MORNING AND TAKE 1 CAPSULE AT BEDTIME 180 capsule 1   oxybutynin (DITROPAN) 5 MG tablet Take 5 mg by mouth 2 (two) times daily.     pilocarpine (SALAGEN) 5 MG tablet Take 5 mg by mouth 2 (two) times daily.     prednisoLONE acetate (PRED FORTE) 1 % ophthalmic suspension Place 1 drop into both eyes daily as needed (dry eyes).     pregabalin (LYRICA) 100 MG capsule Take 100 mg by mouth 2 (two) times daily.     promethazine (PHENERGAN) 25 MG tablet Take 25 mg by mouth every 6 (six) hours as needed for nausea or vomiting.     propranolol (INDERAL) 10 MG tablet Take 10 mg by mouth 2 (two) times daily.     JANUVIA 25 MG tablet Take 25 mg by mouth daily.      Results for orders placed or performed during the hospital encounter of 06/20/21 (from the past 48 hour(s))  Glucose, capillary     Status: Abnormal   Collection Time: 06/20/21  7:53 AM  Result Value Ref Range   Glucose-Capillary 117 (H) 70 - 99 mg/dL    Comment: Glucose reference range applies only to samples taken after fasting for at least 8 hours.   No results found.  Review of Systems  All other systems reviewed and are negative.  Blood pressure (!) 206/64, pulse 61, temperature 98.3 F (36.8 C), temperature source Oral, resp. rate (!) 21, height 5\' 6"  (1.676  m), weight 121.6 kg, last menstrual period 05/20/2012, SpO2 97 %. Physical Exam HENT:     Head: Atraumatic.  Eyes:     Extraocular Movements: Extraocular movements intact.  Cardiovascular:     Pulses: Normal pulses.  Pulmonary:     Effort: Pulmonary effort is normal.  Musculoskeletal:     Comments: L shoulder pain with RC testing, ROM.   Skin:    General: Skin is warm and dry.  Neurological:     Mental Status: She is alert.  Psychiatric:        Mood and Affect: Mood normal.     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  Pain limits sleep and quality of life.  Failed conservative tx. Plan L arth debridement, SAD, biceps tenotomy Risks / benefits of surgery discussed Consent on chart  NPO for OR Preop antibiotics   Rhae Hammock, MD 06/20/2021, 9:18 AM

## 2021-06-20 NOTE — Discharge Instructions (Signed)
Discharge Instructions after Arthroscopic Shoulder Surgery   A sling has been provided for you. You may remove the sling after 72 hours. The sling may be worn for your protection, if you are in a crowd.  Use ice on the shoulder intermittently over the first 48 hours after surgery.  You may take Extra Strength Tylenol or Tylenol only in place of the pain pills. DO NOT take ANY nonsteroidal anti-inflammatory pain medications: Advil, Motrin, Ibuprofen, Aleve, Naproxen, or Naprosyn.  If you additional pain medication, please contact your pain management provider, as we are unable to prescribe pain medication for you given the medications you are currently on You may remove your dressing after two days.  You may shower 5 days after surgery. The incision CANNOT get wet prior to 5 days. Simply allow the water to wash over the site and then pat dry. Do not rub the incision. Make sure your axilla (armpit) is completely dry after showering.  Take one aspirin a day for 2 weeks after surgery, unless you have an aspirin sensitivity/allergy or asthma.  Three to 5 times each day you should perform assisted overhead reaching and external rotation (outward turning) exercises with the operative arm. Both exercises should be done with the non-operative arm used as the "therapist arm" while the operative arm remains relaxed. Ten of each exercise should be done three to five times each day.    Overhead reach is helping to lift your stiff arm up as high as it will go. To stretch your overhead reach, lie flat on your back, relax, and grasp the wrist of the tight shoulder with your opposite hand. Using the power in your opposite arm, bring the stiff arm up as far as it is comfortable. Start holding it for ten seconds and then work up to where you can hold it for a count of 30. Breathe slowly and deeply while the arm is moved. Repeat this stretch ten times, trying to help the arm up a little higher each time.        External rotation is turning the arm out to the side while your elbow stays close to your body. External rotation is best stretched while you are lying on your back. Hold a cane, yardstick, broom handle, or dowel in both hands. Bend both elbows to a right angle. Use steady, gentle force from your normal arm to rotate the hand of the stiff shoulder out away from your body. Continue the rotation as far as it will go comfortably, holding it there for a count of 10. Repeat this exercise ten times.     Please call [phone] during normal business hours or [phone] after hours for any problems. Including the following:  - excessive redness of the incisions - drainage for more than 4 days - fever of more than 101.5 F  *Please note that pain medications will not be refilled after hours or on weekends.

## 2021-06-20 NOTE — Transfer of Care (Signed)
Immediate Anesthesia Transfer of Care Note  Patient: Heidi [name]  Procedure(s) Performed: SHOULDER ARTHROSCOPY ROTATOR CUFF DEBRIDEMENT WITH SUBACROMIAL DECOMPRESSION, BICEPS TENOTOMY (Left: Shoulder)  Patient Location: PACU  Anesthesia Type:General  Level of Consciousness: drowsy and patient cooperative  Airway & Oxygen Therapy: Patient Spontanous Breathing and Patient connected to face mask oxygen  Post-op Assessment: Report given to RN and Post -op Vital signs reviewed and stable  Post vital signs: Reviewed and stable  Last Vitals:  Vitals Value Taken Time  BP 135/75 06/20/21 1130  Temp    Pulse 59 06/20/21 1131  Resp 18 06/20/21 1131  SpO2 100 % 06/20/21 1131  Vitals shown include unvalidated device data.  Last Pain:  Vitals:   06/20/21 0912  TempSrc:   PainSc: Asleep      Patients Stated Pain Goal: 7 (60/67/70 3403)  Complications: No notable events documented.

## 2021-06-20 NOTE — Op Note (Signed)
Procedure(s): Procedure Note  JARETSSI KRAKER female 65 y.o. 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 #4 left shoulder impingement  Postoperative diagnosis: Same  Procedure: #1 left shoulder arthroscopic extensive debridement #2 left shoulder arthroscopic subacromial decompression #3 left shoulder proximal long head biceps tenotomy  Surgeon(s) and Role:    Tania Ade, MD - Primary     Surgeon: Rhae Hammock   Assistants: Sheryle Hail PA-C Amber was present and scrubbed throughout the procedure and was essential in positioning, assisting with the camera and instrumentation,, and closure)  Anesthesia: General endotracheal anesthesia with preoperative interscalene block given by the attending anesthesiologist    Procedure Detail   Estimated Blood Loss: Min         Drains: none  Blood Given: none         Specimens: none        Complications:  * No complications entered in OR log *         Disposition: PACU - hemodynamically stable.         Condition: stable    Procedure:   INDICATIONS FOR SURGERY: The patient is 65 y.o. female who has a long history of left shoulder pain and dysfunction with irreparable rotator cuff tear and early arthritis.  She maintains good function and arthritis is relatively mild to moderate on x-ray and so we discussed arthroscopic debridement with tenotomy versus reverse total shoulder arthroplasty.  She elected to proceed with the least invasive procedure first.  OPERATIVE FINDINGS: Examination under anesthesia: No stiffness or instability.   DESCRIPTION OF PROCEDURE: The patient was identified in preoperative  holding area where I personally marked the operative site after  verifying site, side, and procedure with the patient. An interscalene block was given by the attending anesthesiologist  the holding area.  The patient was taken back to the operating room where general anesthesia was induced without complication and was placed in the beach-chair position with the back  elevated about 60 degrees and all extremities and head and neck carefully padded and  positioned.   The left upper extremity was then prepped and  draped in a standard sterile fashion. The appropriate time-out  procedure was carried out. The patient did receive IV antibiotics  within 30 minutes of incision.   A small posterior portal incision was made and the arthroscope was introduced into the joint. An anterior portal was then established above the subscapularis using needle localization. Small cannula was placed anteriorly. Diagnostic arthroscopy was then carried out.  She was noted to have extensive synovitis in the joint.  This was debrided extensively with the shaver and ArthroCare.  The biceps tendon was pulled in and noted to have extensive proximal tearing involving greater than 50% of the thickness of the tendon.  This was felt to be a significant pain generator and was released from the superior labrum through a small anterior incision with curved Mayo scissors.  The articular surface of the glenoid and humeral head were noted to have fairly advanced cartilage loss with most of the humeral head being grade 4 exposed bone.  The supraspinatus and infraspinatus were chronically torn and retracted.  The arthroscope was then introduced into the subacromial space a standard lateral portal was established with needle localization. The shaver was used through the lateral portal to perform extensive bursectomy.   The tendon edge and tuberosity were debrided.  The coracoacromial  ligament was taken down off the anterior acromion with the ArthroCare exposing a jagged irregular anterior acromial spur. A high-speed bur was then used through the lateral portal to take down the anterior acromial spur from lateral to  medial in a standard acromioplasty.  The acromioplasty was also viewed from the lateral portal and the bur was used as necessary to ensure that the acromion was completely flat from posterior to anterior.  The arthroscopic equipment was removed from the joint and the portals were closed with 3-0 nylon in an interrupted fashion. Sterile dressings were then applied including Xeroform 4 x 4's ABDs and tape. The patient was then allowed to awaken from general anesthesia, placed in a sling, transferred to the stretcher and taken to the recovery room in stable condition.   POSTOPERATIVE PLAN: The patient will be discharged home today and will followup in one week for suture removal and wound check.

## 2021-06-20 NOTE — Progress Notes (Signed)
AssistedDr. Oren Bracket with left, ultrasound guided, interscalene  block. Side rails up, monitors on throughout procedure. See vital signs in flow sheet. Tolerated Procedure well.

## 2021-06-20 NOTE — Anesthesia Procedure Notes (Signed)
Anesthesia Regional Block: Interscalene brachial plexus block   Pre-Anesthetic Checklist: , timeout performed,  Correct Patient, Correct Site, Correct Laterality,  Correct Procedure, Correct Position, site marked,  Risks and benefits discussed,  Pre-op evaluation,  At surgeon's request and post-op pain management  Laterality: Left  Prep: Maximum Sterile Barrier Precautions used, chloraprep       Needles:  Injection technique: Single-shot  Needle Type: Echogenic Stimulator Needle     Needle Length: 5cm  Needle Gauge: 22     Additional Needles:   Procedures:,,,, ultrasound used (permanent image in chart),,    Narrative:  Start time: 06/20/2021 9:02 AM End time: 06/20/2021 9:12 AM Injection made incrementally with aspirations every 5 mL. Anesthesiologist: Roderic Palau, MD  Additional Notes: 2% Lidocaine skin wheel.

## 2021-06-20 NOTE — Anesthesia Postprocedure Evaluation (Signed)
Anesthesia Post Note  Patient: CELENA LANIUS  Procedure(s) Performed: SHOULDER ARTHROSCOPY ROTATOR CUFF DEBRIDEMENT WITH SUBACROMIAL DECOMPRESSION, BICEPS TENOTOMY (Left: Shoulder)     Patient location during evaluation: PACU Anesthesia Type: General and Regional Level of consciousness: awake and alert Pain management: pain level controlled Vital Signs Assessment: post-procedure vital signs reviewed and stable Respiratory status: spontaneous breathing, nonlabored ventilation and respiratory function stable Cardiovascular status: blood pressure returned to baseline and stable Postop Assessment: no apparent nausea or vomiting Anesthetic complications: no   No notable events documented.  Last Vitals:  Vitals:   06/20/21 1345 06/20/21 1402  BP: (!) 171/85   Pulse: (!) 59 66  Resp: 18 16  Temp:    SpO2: 92% 98%    Last Pain:  Vitals:   06/20/21 1402  TempSrc:   PainSc: 0-No pain                 Eria Lozoya,W. EDMOND

## 2021-06-21 ENCOUNTER — Encounter (HOSPITAL_COMMUNITY): Payer: Self-pay | Admitting: Orthopedic Surgery

## 2021-07-18 ENCOUNTER — Other Ambulatory Visit (HOSPITAL_COMMUNITY): Payer: Self-pay | Admitting: Physician Assistant

## 2021-07-18 DIAGNOSIS — E559 Vitamin D deficiency, unspecified: Secondary | ICD-10-CM

## 2021-08-05 ENCOUNTER — Ambulatory Visit (HOSPITAL_COMMUNITY): Payer: Medicare PPO | Admitting: Physician Assistant

## 2021-08-05 ENCOUNTER — Inpatient Hospital Stay (HOSPITAL_COMMUNITY): Payer: Medicare PPO

## 2021-08-20 ENCOUNTER — Other Ambulatory Visit: Payer: Self-pay | Admitting: Orthopedic Surgery

## 2021-08-27 ENCOUNTER — Inpatient Hospital Stay (HOSPITAL_COMMUNITY): Payer: Medicare PPO

## 2021-08-27 ENCOUNTER — Ambulatory Visit (HOSPITAL_COMMUNITY): Payer: Medicare PPO | Admitting: Physician Assistant

## 2021-09-03 ENCOUNTER — Encounter (HOSPITAL_COMMUNITY): Payer: Self-pay | Admitting: Physician Assistant

## 2021-09-03 ENCOUNTER — Ambulatory Visit (HOSPITAL_COMMUNITY): Payer: Medicare PPO | Admitting: Physician Assistant

## 2021-09-03 ENCOUNTER — Encounter (HOSPITAL_COMMUNITY): Payer: Self-pay

## 2021-09-03 ENCOUNTER — Inpatient Hospital Stay (HOSPITAL_COMMUNITY): Payer: Medicare PPO

## 2021-09-03 ENCOUNTER — Other Ambulatory Visit: Payer: Self-pay

## 2021-09-03 ENCOUNTER — Encounter (HOSPITAL_COMMUNITY)
Admission: RE | Admit: 2021-09-03 | Discharge: 2021-09-03 | Disposition: A | Discharge status: Home / Self Care | Payer: Medicare PPO | Source: Ambulatory Visit | Attending: Orthopedic Surgery | Admitting: Orthopedic Surgery

## 2021-09-03 VITALS — BP 154/58 | HR 66 | Temp 98.2°F | Resp 18 | Ht 66.0 in | Wt 265.0 lb

## 2021-09-03 DIAGNOSIS — Z01818 Encounter for other preprocedural examination: Secondary | ICD-10-CM

## 2021-09-03 DIAGNOSIS — E785 Hyperlipidemia, unspecified: Secondary | ICD-10-CM | POA: Insufficient documentation

## 2021-09-03 DIAGNOSIS — E1169 Type 2 diabetes mellitus with other specified complication: Secondary | ICD-10-CM | POA: Diagnosis not present

## 2021-09-03 DIAGNOSIS — I2510 Atherosclerotic heart disease of native coronary artery without angina pectoris: Secondary | ICD-10-CM | POA: Diagnosis not present

## 2021-09-03 DIAGNOSIS — Z01812 Encounter for preprocedural laboratory examination: Secondary | ICD-10-CM | POA: Insufficient documentation

## 2021-09-03 HISTORY — DX: Restless legs syndrome: G25.81

## 2021-09-03 HISTORY — DX: Other cervical disc degeneration, unspecified cervical region: M50.30

## 2021-09-03 LAB — BASIC METABOLIC PANEL
Anion gap: 8 (ref 5–15)
BUN: 13 mg/dL (ref 8–23)
CO2: 26 mmol/L (ref 22–32)
Calcium: 8.9 mg/dL (ref 8.9–10.3)
Chloride: 99 mmol/L (ref 98–111)
Creatinine, Ser: 0.72 mg/dL (ref 0.44–1.00)
GFR, Estimated: >60 mL/min (ref >60)
Glucose, Bld: 148 mg/dL — ABNORMAL HIGH (ref 70–99)
Potassium: 3.4 mmol/L — ABNORMAL LOW (ref 3.5–5.1)
Sodium: 133 mmol/L — ABNORMAL LOW (ref 135–145)

## 2021-09-03 LAB — CBC
HCT: 35.5 % — ABNORMAL LOW (ref 36.0–46.0)
Hemoglobin: 11.7 g/dL — ABNORMAL LOW (ref 12.0–15.0)
MCH: 31 pg (ref 26.0–34.0)
MCHC: 33 g/dL (ref 30.0–36.0)
MCV: 94.2 fL (ref 80.0–100.0)
Platelets: 179 10*3/uL (ref 150–400)
RBC: 3.77 MIL/uL — ABNORMAL LOW (ref 3.87–5.11)
RDW: 14.2 % (ref 11.5–15.5)
WBC: 5.4 10*3/uL (ref 4.0–10.5)
nRBC: 0 % (ref 0.0–0.2)

## 2021-09-03 LAB — SURGICAL PCR SCREEN
MRSA, PCR: NEGATIVE
Staphylococcus aureus: POSITIVE — AB

## 2021-09-03 LAB — GLUCOSE, CAPILLARY: Glucose-Capillary: 152 mg/dL — ABNORMAL HIGH (ref 70–99)

## 2021-09-03 LAB — HEMOGLOBIN A1C
Hgb A1c MFr Bld: 5.7 % — ABNORMAL HIGH (ref 4.8–5.6)
Mean Plasma Glucose: 116.89 mg/dL

## 2021-09-03 NOTE — Progress Notes (Signed)
[facility] + results routed to Dr. Chandler.  ?

## 2021-09-03 NOTE — Patient Instructions (Addendum)
DUE TO COVID-19 ONLY ONE VISITOR IS ALLOWED TO COME WITH YOU AND STAY IN THE WAITING ROOM ONLY DURING PRE OP AND PROCEDURE.   **NO VISITORS ARE ALLOWED IN THE SHORT STAY AREA OR RECOVERY ROOM!!**       Your procedure is scheduled on: 09/12/21    Report to [facility] Main Entrance    Report to admitting at 6:45 AM   Call this number if you have problems the morning of surgery [phone]   Do not eat food :After Midnight.   May have liquids until 6:30 AM day of surgery  CLEAR LIQUID DIET  Foods Allowed                                                                     Foods Excluded  Water, Black Coffee and tea (no milk or creamer)           liquids that you cannot  Plain Jell-O in any flavor  (No red)                                    see through such as: Fruit ices (not with fruit pulp)                                            milk, soups, orange juice              Iced Popsicles (No red)                                                All solid food                                   Apple juices Sports drinks like Gatorade (No red) Lightly seasoned clear broth or consume(fat free) Sugar    The day of surgery:  Drink ONE (1) Pre-Surgery G2 at 6:30 AM the morning of surgery. Drink in one sitting. Do not sip.  This drink was given to you during your hospital  pre-op appointment visit. Nothing else to drink after completing the  Pre-Surgery G2.          If you have questions, please contact your surgeons office.     Oral Hygiene is also important to reduce your risk of infection.                                    Remember - BRUSH YOUR TEETH THE MORNING OF SURGERY WITH YOUR REGULAR TOOTHPASTE   Stop all vitamins and supplements 7 days before surgery.   Ask your doctor if you need to stop Subutex before surgery.   Take these medicines the morning of surgery with A SIP OF WATER: Allopurinol, Cogentin, Cymbalta, Lamictal, Prilosec, Ditropan, Lyrica, Propranolol  DO NOT TAKE ANY ORAL DIABETIC MEDICATIONS DAY OF YOUR SURGERY  How to Manage Your Diabetes Before and After Surgery  Why is it important to control my blood sugar before and after surgery? Improving blood sugar levels before and after surgery helps healing and can limit problems. A way of improving blood sugar control is eating a healthy diet by:  Eating less sugar and carbohydrates  Increasing activity/exercise  Talking with your doctor about reaching your blood sugar goals High blood sugars (greater than 180 mg/dL) can raise your risk of infections and slow your recovery, so you will need to focus on controlling your diabetes during the weeks before surgery. Make sure that the doctor who takes care of your diabetes knows about your planned surgery including the date and location.  How do I manage my blood sugar before surgery? Check your blood sugar at least 4 times a day, starting 2 days before surgery, to make sure that the level is not too high or low. Check your blood sugar the morning of your surgery when you wake up and every 2 hours until you get to the Short Stay unit. If your blood sugar is less than 70 mg/dL, you will need to treat for low blood sugar: Do not take insulin. Treat a low blood sugar (less than 70 mg/dL) with  cup of clear juice (cranberry or apple), 4 glucose tablets, OR glucose gel. Recheck blood sugar in 15 minutes after treatment (to make sure it is greater than 70 mg/dL). If your blood sugar is not greater than 70 mg/dL on recheck, call [phone] for further instructions. Report your blood sugar to the short stay nurse when you get to Short Stay.  If you are admitted to the hospital after surgery: Your blood sugar will be checked by the staff and you will probably be given insulin after surgery (instead of oral diabetes medicines) to make sure you have good blood sugar levels. The goal for blood sugar control after surgery is 80-180 mg/dL.   WHAT DO I  DO ABOUT MY DIABETES MEDICATION?  Do not take oral diabetes medicines (pills) the morning of surgery.  THE DAY BEFORE SURGERY, take Metformin as prescribed       THE MORNING OF SURGERY, do not take Metformin  Reviewed and Endorsed by [facility] Patient Education Committee, August 2015                               You may not have any metal on your body including hair pins, jewelry, and body piercing             Do not wear make-up, lotions, powders, perfumes, or deodorant  Do not wear nail polish including gel and S&S, artificial/acrylic nails, or any other type of covering on natural nails including finger and toenails. If you have artificial nails, gel coating, etc. that needs to be removed by a nail salon please have this removed prior to surgery or surgery may need to be canceled/ delayed if the surgeon/ anesthesia feels like they are unable to be safely monitored.   Do not shave  48 hours prior to surgery.    Do not bring valuables to the hospital. [place].   Bring CPAP mask and tubing day of surgery.    Patients discharged on the day  of surgery will not be allowed to drive home.  Someone needs to stay with you for the first 24 hours after anesthesia.   Special Instructions: Bring a copy of your healthcare power of attorney and living will documents         the day of surgery if you haven't scanned them before.              Please read over the following fact sheets you were given: IF YOU HAVE QUESTIONS ABOUT YOUR PRE-OP INSTRUCTIONS PLEASE CALL [facility] - Preparing for Surgery Before surgery, you can play an important role.  Because skin is not sterile, your skin needs to be as free of germs as possible.  You can reduce the number of germs on your skin by washing with CHG (chlorahexidine gluconate) soap before surgery.  CHG is an antiseptic cleaner which kills germs and bonds with the skin to  continue killing germs even after washing. Please DO NOT use if you have an allergy to CHG or antibacterial soaps.  If your skin becomes reddened/irritated stop using the CHG and inform your nurse when you arrive at Short Stay. Do not shave (including legs and underarms) for at least 48 hours prior to the first CHG shower.  You may shave your face/neck.  Please follow these instructions carefully:  1.  Shower with CHG Soap the night before surgery and the  morning of surgery.  2.  If you choose to wash your hair, wash your hair first as usual with your normal  shampoo.  3.  After you shampoo, rinse your hair and body thoroughly to remove the shampoo.                             4.  Use CHG as you would any other liquid soap.  You can apply chg directly to the skin and wash.  Gently with a scrungie or clean washcloth.  5.  Apply the CHG Soap to your body ONLY FROM THE NECK DOWN.   Do   not use on face/ open                           Wound or open sores. Avoid contact with eyes, ears mouth and   genitals (private parts).                       Wash face,  Genitals (private parts) with your normal soap.             6.  Wash thoroughly, paying special attention to the area where your    surgery  will be performed.  7.  Thoroughly rinse your body with warm water from the neck down.  8.  DO NOT shower/wash with your normal soap after using and rinsing off the CHG Soap.                9.  Pat yourself dry with a clean towel.            10.  Wear clean pajamas.            11.  Place clean sheets on your bed the night of your first shower and do not  sleep with pets. Day of Surgery : Do not apply any lotions/deodorants the morning of surgery.  Please wear clean clothes to  the hospital/surgery center.  FAILURE TO FOLLOW THESE INSTRUCTIONS MAY RESULT IN THE CANCELLATION OF YOUR SURGERY  PATIENT SIGNATURE_________________________________  NURSE  SIGNATURE__________________________________  ________________________________________________________________________   Adam Phenix  An incentive spirometer is a tool that can help keep your lungs clear and active. This tool measures how well you are filling your lungs with each breath. Taking long deep breaths may help reverse or decrease the chance of developing breathing (pulmonary) problems (especially infection) following: A long period of time when you are unable to move or be active. BEFORE THE PROCEDURE  If the spirometer includes an indicator to show your best effort, your nurse or respiratory therapist will set it to a desired goal. If possible, sit up straight or lean slightly forward. Try not to slouch. Hold the incentive spirometer in an upright position. INSTRUCTIONS FOR USE  Sit on the edge of your bed if possible, or sit up as far as you can in bed or on a chair. Hold the incentive spirometer in an upright position. Breathe out normally. Place the mouthpiece in your mouth and seal your lips tightly around it. Breathe in slowly and as deeply as possible, raising the piston or the ball toward the top of the column. Hold your breath for 3-5 seconds or for as long as possible. Allow the piston or ball to fall to the bottom of the column. Remove the mouthpiece from your mouth and breathe out normally. Rest for a few seconds and repeat Steps 1 through 7 at least 10 times every 1-2 hours when you are awake. Take your time and take a few normal breaths between deep breaths. The spirometer may include an indicator to show your best effort. Use the indicator as a goal to work toward during each repetition. After each set of 10 deep breaths, practice coughing to be sure your lungs are clear. If you have an incision (the cut made at the time of surgery), support your incision when coughing by placing a pillow or rolled up towels firmly against it. Once you are able to get out of  bed, walk around indoors and cough well. You may stop using the incentive spirometer when instructed by your caregiver.  RISKS AND COMPLICATIONS Take your time so you do not get dizzy or light-headed. If you are in pain, you may need to take or ask for pain medication before doing incentive spirometry. It is harder to take a deep breath if you are having pain. AFTER USE Rest and breathe slowly and easily. It can be helpful to keep track of a log of your progress. Your caregiver can provide you with a simple table to help with this. If you are using the spirometer at home, follow these instructions: [place] IF:  You are having difficultly using the spirometer. You have trouble using the spirometer as often as instructed. Your pain medication is not giving enough relief while using the spirometer. You develop fever of 100.5 F (38.1 C) or higher. SEEK IMMEDIATE MEDICAL CARE IF:  You cough up bloody sputum that had not been present before. You develop fever of 102 F (38.9 C) or greater. You develop worsening pain at or near the incision site. MAKE SURE YOU:  Understand these instructions. Will watch your condition. Will get help right away if you are not doing well or get worse. Document Released: 12/15/2006 Document Revised: 10/27/2011 Document Reviewed: 02/15/2007 Exit[employer] Patient Information 2014 [employer], [place].   ________________________________________________________________________  WHAT IS A BLOOD TRANSFUSION? Blood Transfusion  Information  A transfusion is the replacement of blood or some of its parts. Blood is made up of multiple cells which provide different functions. Red blood cells carry oxygen and are used for blood loss replacement. White blood cells fight against infection. Platelets control bleeding. Plasma helps clot blood. Other blood products are available for specialized needs, such as hemophilia or other clotting disorders. BEFORE THE TRANSFUSION   Who gives blood for transfusions?  Healthy volunteers who are fully evaluated to make sure their blood is safe. This is blood bank blood. Transfusion therapy is the safest it has ever been in the practice of medicine. Before blood is taken from a donor, a complete history is taken to make sure that person has no history of diseases nor engages in risky social behavior (examples are intravenous drug use or sexual activity with multiple partners). The donor's travel history is screened to minimize risk of transmitting infections, such as malaria. The donated blood is tested for signs of infectious diseases, such as HIV and hepatitis. The blood is then tested to be sure it is compatible with you in order to minimize the chance of a transfusion reaction. If you or a relative donates blood, this is often done in anticipation of surgery and is not appropriate for emergency situations. It takes many days to process the donated blood. RISKS AND COMPLICATIONS Although transfusion therapy is very safe and saves many lives, the main dangers of transfusion include:  Getting an infectious disease. Developing a transfusion reaction. This is an allergic reaction to something in the blood you were given. Every precaution is taken to prevent this. The decision to have a blood transfusion has been considered carefully by your caregiver before blood is given. Blood is not given unless the benefits outweigh the risks. AFTER THE TRANSFUSION Right after receiving a blood transfusion, you will usually feel much better and more energetic. This is especially true if your red blood cells have gotten low (anemic). The transfusion raises the level of the red blood cells which carry oxygen, and this usually causes an energy increase. The nurse administering the transfusion will monitor you carefully for complications. HOME CARE INSTRUCTIONS  No special instructions are needed after a transfusion. You may find your energy is  better. Speak with your caregiver about any limitations on activity for underlying diseases you may have. SEEK MEDICAL CARE IF:  Your condition is not improving after your transfusion. You develop redness or irritation at the intravenous (IV) site. SEEK IMMEDIATE MEDICAL CARE IF:  Any of the following symptoms occur over the next 12 hours: Shaking chills. You have a temperature by mouth above 102 F (38.9 C), not controlled by medicine. Chest, back, or muscle pain. People around you feel you are not acting correctly or are confused. Shortness of breath or difficulty breathing. Dizziness and fainting. You get a rash or develop hives. You have a decrease in urine output. Your urine turns a dark color or changes to pink, red, or brown. Any of the following symptoms occur over the next 10 days: You have a temperature by mouth above 102 F (38.9 C), not controlled by medicine. Shortness of breath. Weakness after normal activity. The white part of the eye turns yellow (jaundice). You have a decrease in the amount of urine or are urinating less often. Your urine turns a dark color or changes to pink, red, or brown. Document Released: 08/01/2000 Document Revised: 10/27/2011 Document Reviewed: 03/20/2008 [facility] Patient Information 2014 [place], [place].  _______________________________________________________________________  [facility]- Preparing for Total Shoulder Arthroplasty    Before surgery, you can play an important role. Because skin is not sterile, your skin needs to be as free of germs as possible. You can reduce the number of germs on your skin by using the following products. Benzoyl Peroxide Gel Reduces the number of germs present on the skin Applied twice a day to shoulder area starting two days before surgery    ==================================================================  Please follow these instructions carefully:  BENZOYL PEROXIDE 5% GEL  Please do not use  if you have an allergy to benzoyl peroxide.   If your skin becomes reddened/irritated stop using the benzoyl peroxide.  Starting two days before surgery, apply as follows: Apply benzoyl peroxide in the morning and at night. Apply after taking a shower. If you are not taking a shower clean entire shoulder front, back, and side along with the armpit with a clean wet washcloth.  Place a quarter-sized dollop on your shoulder and rub in thoroughly, making sure to cover the front, back, and side of your shoulder, along with the armpit.   2 days before ____ AM   ____ PM              1 day before ____ AM   ____ PM                         Do this twice a day for two days.  (Last application is the night before surgery, AFTER using the CHG soap as described below).  Do NOT apply benzoyl peroxide gel on the day of surgery.

## 2021-09-03 NOTE — Progress Notes (Signed)
COVID swab appointment:n/a  COVID Vaccine Completed: yes x2 Date COVID Vaccine completed: Has received booster: COVID vaccine manufacturer: [employer]   Date of COVID positive in last 90 days: September  PCP - Thornton Papas, NP Cardiologist - Veleta Miners, MD [facility] [place]  Chest x-ray - 10/13/20 Epic EKG - 10/14/20 Epic Stress Test - n/a ECHO - June 2022 per pt w/ cardiologist Cardiac Cath - n/a Pacemaker/ICD device last checked: yes managed by Dr. Kirt Boys, last checked during hospitalization in [facility]  Spinal Cord Stimulator: n/a  Sleep Study - yes, positive CPAP - yes every night   Fasting Blood Sugar - 120-130 Checks Blood Sugar every other day  Blood Thinner Instructions: n/a Aspirin Instructions: Last Dose:  Activity level: Can perform activities of daily living without stopping and without symptoms of chest pain. No stairs due to pain in back, neck and knee. SOB with cleaning/household chores       Anesthesia review: red, warm, weeping wound to left lower leg, patient is seeing PCP 09/04/21. barrett's esophagus, DM 2, sepsis, anemia, asthma, HTN, PONV, OSA  Patient denies shortness of breath, fever, cough and chest pain at PAT appointment   Patient verbalized understanding of instructions that were given to them at the PAT appointment. Patient was also instructed that they will need to review over the PAT instructions again at home before surgery.

## 2021-09-04 NOTE — Progress Notes (Signed)
Heidi [name], [place] [id number]   CLINIC:  Medical Oncology/Hematology  PCP:  Wendi Maya, NP [address] [place] [address] [phone]   REASON FOR VISIT:  Follow-up for iron deficiency anemia  CURRENT THERAPY: Intermittent IV iron (last IV Venofer 06/03/2021)  INTERVAL HISTORY:  Heidi [name] 66 y.o. female returns for routine follow-up of her iron deficiency anemia.  She was last seen by [name] on 05/07/2021.  At today's visit, she reports feeling somewhat poorly.  She had right-sided rotator cuff repair in November, and is scheduled for upcoming left rotator cuff repair.  Unfortunately, she had a COVID infection again in November, which has contributed to her ongoing fatigue.  She reports some dizziness and gait instability.  She denies any current symptoms of pica, chest pain, dyspnea on exertion, or syncope.  She has not noticed any bleeding such as epistaxis, hematemesis, melena, or hematochezia.  She has little to no energy and 100% appetite. She endorses that she is maintaining a stable weight.   REVIEW OF SYSTEMS:  Review of Systems  Constitutional:  Positive for fatigue. Negative for appetite change, chills, diaphoresis, fever and unexpected weight change.  HENT:   Negative for lump/mass and nosebleeds.   Eyes:  Negative for eye problems.  Respiratory:  Negative for cough, hemoptysis and shortness of breath.   Cardiovascular:  Negative for chest pain, leg swelling and palpitations.  Gastrointestinal:  Negative for abdominal pain, blood in stool, constipation, diarrhea, nausea and vomiting.  Genitourinary:  Negative for hematuria.   Musculoskeletal:  Positive for gait problem.  Skin:  Positive for wound (left leg cellulitis).  Neurological:  Positive for dizziness, gait problem and numbness. Negative for headaches and light-headedness.  Hematological:  Does not bruise/bleed easily.   Psychiatric/Behavioral:  Positive for depression. The patient is nervous/anxious.      PAST MEDICAL/SURGICAL HISTORY:  Past Medical History:  Diagnosis Date   Anemia    takes iron supplement   Anxiety    Arthritis    Asthma    prn neb.   Cancer [facility])    right breast cancer   CHF (congestive heart failure) (HCC)    DDD (degenerative disc disease), cervical    Depression    Diabetes mellitus    IDDM   Diverticulitis    Diverticulitis large intestine    Diverticulosis    Environmental allergies    Fibromyalgia    GERD (gastroesophageal reflux disease)    Headache(784.0)    migraines   History of MRSA infection 08/2012   right breast   Hypertension    under control with med., has been on med. x 10-15 yr.   Overactive bladder    Pneumonia    PONV (postoperative nausea and vomiting)    used scop patch 7/14-please use again per pt request   Presence of permanent cardiac pacemaker    Restless leg syndrome    Sjogren's disease ([place])    Sleep apnea    uses CPAP nightly   Past Surgical History:  Procedure Laterality Date   ANKLE ARTHROSCOPY Left    ANKLE FUSION Left    BACK SURGERY     BIOPSY  05/15/2020   Procedure: BIOPSY;  Surgeon: Eloise Harman, DO;  [facility];  Service: Endoscopy;;  gastric    BREAST RECONSTRUCTION  05/24/2012   Procedure: BREAST RECONSTRUCTION;  Surgeon: Cristine Polio, MD;  Location: [facility];  Service: Plastics;  Laterality:  Right;  NO FLEX HD   BREAST RECONSTRUCTION Right 01/17/2013   Procedure: RIGHT BREAST RECONSTRUCTION WITH PLACEMENT OF TISSUE EXPANDER ;  Surgeon: Cristine Polio, MD;  Location: [facility];  Service: Plastics;  Laterality: Right;   BREAST REDUCTION SURGERY Left 02/28/2013   Procedure: LEFT BREAST REDUCTION  (BREAST);  Surgeon: Cristine Polio, MD;  Location: [facility];  Service: Plastics;  Laterality: Left;   CESAREAN SECTION     CHOLECYSTECTOMY      COLONOSCOPY WITH PROPOFOL N/A 08/14/2020   non-bleeding internal hemorrhoids, multiple small mouth diverticula, 5 mm polyp in descending colon, fair prep. Tubular adenoma. Surveillance in 5 years.    ESOPHAGOGASTRODUODENOSCOPY (EGD) WITH PROPOFOL N/A 05/15/2020   gastritis, reactive gastropathy. negative H.pylori. one gastric polyp. normal duodenum   INSERT / REPLACE / REMOVE PACEMAKER     MASTECTOMY Right 03/2012   NASAL SEPTUM SURGERY     POLYPECTOMY  05/15/2020   Procedure: POLYPECTOMY;  Surgeon: Eloise Harman, DO;  [facility];  Service: Endoscopy;;  antral   POLYPECTOMY  08/14/2020   Procedure: POLYPECTOMY;  Surgeon: Eloise Harman, DO;  [facility];  Service: Endoscopy;;   REMOVAL OF TISSUE EXPANDER AND PLACEMENT OF IMPLANT Right 05/10/2013   Procedure: RIGHT REMOVAL OF TISSUE EXPANDER ;  Surgeon: Cristine Polio, MD;  Location: [facility];  Service: Plastics;  Laterality: Right;   REVERSE SHOULDER ARTHROPLASTY Right 03/07/2021   Procedure: REVERSE SHOULDER ARTHROPLASTY;  Surgeon: Tania Ade, MD;  [facility];  Service: Orthopedics;  Laterality: Right;   SHOULDER ARTHROSCOPY Right    SHOULDER ARTHROSCOPY WITH SUBACROMIAL DECOMPRESSION Left 06/20/2021   Procedure: SHOULDER ARTHROSCOPY ROTATOR CUFF DEBRIDEMENT WITH SUBACROMIAL DECOMPRESSION, BICEPS TENOTOMY;  Surgeon: Tania Ade, MD;  [facility];  Service: Orthopedics;  Laterality: Left;   TISSUE EXPANDER PLACEMENT  05/24/2012   Procedure: TISSUE EXPANDER;  Surgeon: Cristine Polio, MD;  Location: [facility];  Service: Plastics;  Laterality: Right;     SOCIAL HISTORY:  Social History   Socioeconomic History   Marital status: Married    Spouse name: Not on file   Number of children: Not on file   Years of education: Not on file   Highest education level: Not on file  Occupational History   Not on file  Tobacco Use   Smoking status: Never    Smokeless tobacco: Never  Vaping Use   Vaping Use: Never used  Substance and Sexual Activity   Alcohol use: No   Drug use: No   Sexual activity: Not Currently  Other Topics Concern   Not on file  Social History Narrative   Not on file   Social Determinants of Health   Financial Resource Strain: Not on file  Food Insecurity: No Food Insecurity   Worried About Running Out of Food in the Last Year: Never true   [place] in the Last Year: Never true  Transportation Needs: No Transportation Needs   Lack of Transportation (Medical): No   Lack of Transportation (Non-Medical): No  Physical Activity: Inactive   Days of Exercise per Week: 0 days   Minutes of Exercise per Session: 0 min  Stress: No Stress Concern Present   Feeling of Stress : Only a little  Social Connections: Not on file  Intimate Partner Violence: Not At Risk   Fear of Current or Ex-Partner: No   Emotionally Abused: No   Physically Abused: No   Sexually  Abused: No    FAMILY HISTORY:  Family History  Problem Relation Age of Onset   Colon cancer Paternal Grandmother    Colon cancer Paternal Grandfather     CURRENT MEDICATIONS:  Outpatient Encounter Medications as of 09/05/2021  Medication Sig   allopurinol (ZYLOPRIM) 100 MG tablet Take 200 mg by mouth daily.   benztropine (COGENTIN) 0.5 MG tablet Take 0.5 mg by mouth 2 (two) times daily.   buprenorphine (SUBUTEX) 2 MG SUBL SL tablet Place 2 mg under the tongue daily.   Buprenorphine HCl-Naloxone HCl 2-0.5 MG FILM Place 1 Film under the tongue at bedtime.   Camphor-Menthol-Methyl Sal (SALONPAS) 3.08-23-08 % PTCH Apply 1 patch topically daily as needed (pain).   Cholecalciferol (VITAMIN D) 50 MCG (2000 UT) CAPS Take 1 capsule by mouth once daily   clonazePAM (KLONOPIN) 0.5 MG tablet Take 0.5-1 mg by mouth at bedtime.   colchicine 0.6 MG tablet Take 0.6 mg by mouth daily as needed (gout flare).   cyanocobalamin (,VITAMIN B-12,) 1000 MCG/ML injection Inject  1,000 mcg into the muscle every 30 (thirty) days.   cyclobenzaprine (FLEXERIL) 5 MG tablet Take 5 mg by mouth 3 (three) times daily as needed for pain.   cycloSPORINE (RESTASIS) 0.05 % ophthalmic emulsion Place 1 drop into both eyes 2 (two) times daily.   DULoxetine (CYMBALTA) 30 MG capsule Take 30 mg by mouth daily.   DULoxetine (CYMBALTA) 60 MG capsule Take 60 mg by mouth at bedtime.   folic acid (FOLVITE) 1 MG tablet Take 1 mg by mouth daily.   furosemide (LASIX) 20 MG tablet Take 20 mg by mouth daily.   hydroxychloroquine (PLAQUENIL) 200 MG tablet Take 400 mg by mouth daily.   lamoTRIgine (LAMICTAL) 100 MG tablet Take 150 mg by mouth 2 (two) times daily.   metFORMIN (GLUCOPHAGE) 500 MG tablet Take 500 mg by mouth 2 (two) times daily with a meal.   methotrexate 2.5 MG tablet Take 10 mg by mouth once a week. Friday   Misc. Devices (ROLLATOR ULTRA-LIGHT) MISC See admin instructions.   modafinil (PROVIGIL) 200 MG tablet Take 200 mg by mouth daily.   Multiple Vitamins-Minerals (PRESERVISION AREDS) CAPS Take 1 capsule by mouth daily.   naloxone (NARCAN) nasal spray 4 mg/0.1 mL Place 4 mg into the nose as needed for opioid reversal.   NYSTATIN powder Apply 2-3 application topically daily as needed (Stomach).   omeprazole (PRILOSEC) 40 MG capsule TAKE 1 CAPSULE BY MOUTH IN THE MORNING AND TAKE 1 CAPSULE AT BEDTIME   oxybutynin (DITROPAN) 5 MG tablet Take 5 mg by mouth 2 (two) times daily.   pilocarpine (SALAGEN) 5 MG tablet Take 5 mg by mouth 2 (two) times daily.   prednisoLONE acetate (PRED FORTE) 1 % ophthalmic suspension Place 1 drop into both eyes daily as needed (dry eyes).   pregabalin (LYRICA) 100 MG capsule Take 100 mg by mouth 2 (two) times daily.   promethazine (PHENERGAN) 25 MG tablet Take 25 mg by mouth every 6 (six) hours as needed for nausea or vomiting.   propranolol (INDERAL) 10 MG tablet Take 10 mg by mouth 2 (two) times daily.   No facility-administered encounter medications on  file as of 09/05/2021.    ALLERGIES:  Allergies  Allergen Reactions   Soap Other (See Comments)    FRAGRANCES CAUSE MIGRAINES   Allevyn Adhesive [Wound Dressings]     Pt unsure of reaction    Aripiprazole     hallucination   Cipro [Ciprofloxacin Hcl]  Diarrhea and Nausea And Vomiting   Ketorolac Tromethamine     confusion   Latuda [Lurasidone Hcl]     Confusion   Mixed Grasses Hives    Sneeze    Pollen Extract     Headache    Prednisone     elevated blood glucose   Requip [Ropinirole]      confusion   Risperidone     hallucination   Sulfamethoxazole-Trimethoprim Hives and Itching   Vraylar [Cariprazine]     Hallucinations    Latex Itching   Sulfa Antibiotics Nausea Only and Rash     PHYSICAL EXAM:  ECOG PERFORMANCE STATUS: 1 - Symptomatic but completely ambulatory  There were no vitals filed for this visit. There were no vitals filed for this visit. Physical Exam Constitutional:      Appearance: Normal appearance. She is obese.  HENT:     Head: Normocephalic and atraumatic.     Mouth/Throat:     Mouth: Mucous membranes are moist.  Eyes:     Extraocular Movements: Extraocular movements intact.     Pupils: Pupils are equal, round, and reactive to light.  Cardiovascular:     Rate and Rhythm: Normal rate and regular rhythm.     Pulses: Normal pulses.     Heart sounds: Normal heart sounds.  Pulmonary:     Effort: Pulmonary effort is normal.     Breath sounds: Normal breath sounds.     Comments: Decreased breath sounds, limited by patient's body habitus Abdominal:     General: Bowel sounds are normal.     Palpations: Abdomen is soft.     Tenderness: There is no abdominal tenderness.  Musculoskeletal:        General: No swelling.     Right lower leg: No edema.     Left lower leg: No edema.     Comments: Bilateral edema of lower extremities, left greater than right (history of left ankle and knee surgery).  Lymphadenopathy:     Cervical: No cervical  adenopathy.  Skin:    General: Skin is warm and dry.     Comments: Erythema of left lower leg with few open sores using serous drainage (currently on antibiotics for cellulitis)  Neurological:     General: No focal deficit present.     Mental Status: She is alert and oriented to person, place, and time.  Psychiatric:        Mood and Affect: Mood normal.        Behavior: Behavior normal.     LABORATORY DATA:  I have reviewed the labs as listed.  CBC    Component Value Date/Time   WBC 5.4 09/03/2021 1253   RBC 3.77 (L) 09/03/2021 1253   HGB 11.7 (L) 09/03/2021 1253   HCT 35.5 (L) 09/03/2021 1253   PLT 179 09/03/2021 1253   MCV 94.2 09/03/2021 1253   MCH 31.0 09/03/2021 1253   MCHC 33.0 09/03/2021 1253   RDW 14.2 09/03/2021 1253   LYMPHSABS 1.5 05/07/2021 1243   MONOABS 0.4 05/07/2021 1243   EOSABS 0.3 05/07/2021 1243   BASOSABS 0.0 05/07/2021 1243   CMP Latest Ref Rng & Units 09/03/2021 06/12/2021 02/27/2021  Glucose 70 - 99 mg/dL 148(H) 108(H) 126(H)  BUN 8 - 23 mg/dL 13 11 12   Creatinine 0.44 - 1.00 mg/dL 0.72 0.77 0.83  Sodium 135 - 145 mmol/L 133(L) 138 135  Potassium 3.5 - 5.1 mmol/L 3.4(L) 3.3(L) 3.2(L)  Chloride 98 - 111 mmol/L 99 104 95(L)  CO2 22 - 32 mmol/L 26 24 29   Calcium 8.9 - 10.3 mg/dL 8.9 8.8(L) 8.6(L)  Total Protein 6.5 - 8.1 g/dL - - 6.9  Total Bilirubin 0.3 - 1.2 mg/dL - - 0.7  Alkaline Phos 38 - 126 U/L - - 109  AST 15 - 41 U/L - - 18  ALT 0 - 44 U/L - - 17    DIAGNOSTIC IMAGING:  I have independently reviewed the relevant imaging and discussed with the patient.  ASSESSMENT & PLAN: 1.  Microcytic iron deficient anemia secondary to chronic GI blood loss - Patient has had intermittent melena for the past year.  However, gastroenterology work-up thus far has not found specific source of bleeding. - EGD on 05/15/2020 which showed gastritis and one gastric polyp. - Colonoscopy on 08/14/2020 showed nonbleeding internal hemorrhoids, diverticulosis, and  one polyp. - Patient is unable to have capsule endoscopy due to implanted pacemaker.   - Work-up for other causes of anemia was unremarkable: Normal SPEP/IFE/free light chains, normal B12/MMA, folate, copper; LDH normal; reticulocytes low at 2.2 likely hypoproliferation in the setting of iron deficiency; erythropoietin appropriately elevated at 119.4 - Unable to tolerate iron pill due to severe constipation - Most recent IV Venofer 06/03/2021. - No recent melena or hematochezia - She continues to have severe chronic fatigue which is multifactorial - Labs today (09/05/2021): Hgb 11.9/MCV 96.2, ferritin 197, iron saturation 17% - PLAN: No indication for IV iron.  Repeat labs and RTC in 4 months with phone visit.   2.  History of right breast DCIS - Patient does have a history of stage 0 breast cancer, multicentric DCIS of the right breast, s/p right mastectomy.  Pathology showed no residual disease, ER and PR status could not be assessed. Patient was on norethindrone for abnormal uterine function at the time of her diagnosis.  She did not receive chemo or radiation; she was not placed on tamoxifen or anastrozole.  Her oncologist discharged her to PCP for annual breast exams and mammograms. - Most recent mammogram in March 2022 reportedly normal - PLAN: Continue annual mammograms and breast exams with PCP.   3.  Vitamin D deficiency - Labs (12/20/2020) show decreased vitamin D at 20.74 - Prescription sent prescription sent for daily vitamin D supplementation 2000 units - Repeat vitamin D (05/07/2021): Normal at 38.97. - PLAN: Continue taking vitamin D 2000 units daily. - We will recheck vitamin D at follow-up visit in 3 months   4.  Vitamin B12 deficiency - Patient reports that she gets B12 injections, self-administered at home - Most recent B12 (05/07/2021): Normal vitamin B12 at 804, methylmalonic acid normal - PLAN: Continue B12 injections at home.  We will recheck B12 and methylmalonic acid at  follow-up visit in 3 months.   5.  Other history - PMH:  Other PMH includes type 2 diabetes mellitus, hypertension, tachybradycardia syndrome s/p pacemaker, Sjogren's disease, chronic pain syndrome (fibromyalgia and arthritis) on methadone, and other conditions noted elsewhere in medical record.  She is awaiting rotator cuff surgery of both shoulders.  She has chronic back pain and spinal stenosis.  She has sleep apnea, uses CPAP at home. - Social history: She lives at home with her husband.  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 She denies tobacco, alcohol, illicit drug use. - Family history:  Her family history is positive for paternal grandfather with colon cancer, paternal grandmother with ovarian cancer.   PLAN SUMMARY & DISPOSITION: Labs in 4  months Phone visit after labs  All questions were answered. The patient knows to call the clinic with any problems, questions or concerns.  Medical decision making: Moderate  Time spent on visit: I spent 20 minutes counseling the patient face to face. The total time spent in the appointment was 30 minutes and more than 50% was on counseling.   Harriett Rush, PA-C  09/05/2021 11:46 AM

## 2021-09-05 ENCOUNTER — Inpatient Hospital Stay (HOSPITAL_COMMUNITY): Payer: Medicare PPO | Admitting: Physician Assistant

## 2021-09-05 ENCOUNTER — Inpatient Hospital Stay (HOSPITAL_COMMUNITY): Payer: Medicare PPO | Attending: Hematology

## 2021-09-05 ENCOUNTER — Other Ambulatory Visit: Payer: Self-pay

## 2021-09-05 VITALS — BP 117/60 | HR 65 | Temp 98.6°F | Resp 18 | Wt 276.5 lb

## 2021-09-05 DIAGNOSIS — G894 Chronic pain syndrome: Secondary | ICD-10-CM | POA: Insufficient documentation

## 2021-09-05 DIAGNOSIS — K219 Gastro-esophageal reflux disease without esophagitis: Secondary | ICD-10-CM | POA: Insufficient documentation

## 2021-09-05 DIAGNOSIS — I509 Heart failure, unspecified: Secondary | ICD-10-CM | POA: Diagnosis not present

## 2021-09-05 DIAGNOSIS — E559 Vitamin D deficiency, unspecified: Secondary | ICD-10-CM

## 2021-09-05 DIAGNOSIS — M797 Fibromyalgia: Secondary | ICD-10-CM | POA: Diagnosis not present

## 2021-09-05 DIAGNOSIS — D500 Iron deficiency anemia secondary to blood loss (chronic): Secondary | ICD-10-CM

## 2021-09-05 DIAGNOSIS — J45909 Unspecified asthma, uncomplicated: Secondary | ICD-10-CM | POA: Insufficient documentation

## 2021-09-05 DIAGNOSIS — R2689 Other abnormalities of gait and mobility: Secondary | ICD-10-CM | POA: Insufficient documentation

## 2021-09-05 DIAGNOSIS — M1990 Unspecified osteoarthritis, unspecified site: Secondary | ICD-10-CM | POA: Insufficient documentation

## 2021-09-05 DIAGNOSIS — Z8614 Personal history of Methicillin resistant Staphylococcus aureus infection: Secondary | ICD-10-CM | POA: Insufficient documentation

## 2021-09-05 DIAGNOSIS — E538 Deficiency of other specified B group vitamins: Secondary | ICD-10-CM

## 2021-09-05 DIAGNOSIS — G2581 Restless legs syndrome: Secondary | ICD-10-CM | POA: Diagnosis not present

## 2021-09-05 DIAGNOSIS — M3500 Sicca syndrome, unspecified: Secondary | ICD-10-CM | POA: Diagnosis not present

## 2021-09-05 DIAGNOSIS — D509 Iron deficiency anemia, unspecified: Secondary | ICD-10-CM | POA: Diagnosis not present

## 2021-09-05 DIAGNOSIS — E119 Type 2 diabetes mellitus without complications: Secondary | ICD-10-CM | POA: Insufficient documentation

## 2021-09-05 DIAGNOSIS — M129 Arthropathy, unspecified: Secondary | ICD-10-CM | POA: Insufficient documentation

## 2021-09-05 DIAGNOSIS — R42 Dizziness and giddiness: Secondary | ICD-10-CM | POA: Insufficient documentation

## 2021-09-05 DIAGNOSIS — I10 Essential (primary) hypertension: Secondary | ICD-10-CM | POA: Insufficient documentation

## 2021-09-05 DIAGNOSIS — Z950 Presence of cardiac pacemaker: Secondary | ICD-10-CM | POA: Insufficient documentation

## 2021-09-05 DIAGNOSIS — I110 Hypertensive heart disease with heart failure: Secondary | ICD-10-CM | POA: Diagnosis not present

## 2021-09-05 DIAGNOSIS — Z79899 Other long term (current) drug therapy: Secondary | ICD-10-CM | POA: Diagnosis not present

## 2021-09-05 DIAGNOSIS — Z7984 Long term (current) use of oral hypoglycemic drugs: Secondary | ICD-10-CM | POA: Diagnosis not present

## 2021-09-05 DIAGNOSIS — K317 Polyp of stomach and duodenum: Secondary | ICD-10-CM | POA: Diagnosis not present

## 2021-09-05 LAB — CBC WITH DIFFERENTIAL/PLATELET
Abs Immature Granulocytes: 0.02 10*3/uL (ref 0.00–0.07)
Basophils Absolute: 0.1 10*3/uL (ref 0.0–0.1)
Basophils Relative: 1 %
Eosinophils Absolute: 0.2 10*3/uL (ref 0.0–0.5)
Eosinophils Relative: 3 %
HCT: 35.8 % — ABNORMAL LOW (ref 36.0–46.0)
Hemoglobin: 11.9 g/dL — ABNORMAL LOW (ref 12.0–15.0)
Immature Granulocytes: 0 %
Lymphocytes Relative: 22 %
Lymphs Abs: 1.6 10*3/uL (ref 0.7–4.0)
MCH: 32 pg (ref 26.0–34.0)
MCHC: 33.2 g/dL (ref 30.0–36.0)
MCV: 96.2 fL (ref 80.0–100.0)
Monocytes Absolute: 0.6 10*3/uL (ref 0.1–1.0)
Monocytes Relative: 9 %
Neutro Abs: 4.7 10*3/uL (ref 1.7–7.7)
Neutrophils Relative %: 65 %
Platelets: 185 10*3/uL (ref 150–400)
RBC: 3.72 MIL/uL — ABNORMAL LOW (ref 3.87–5.11)
RDW: 14.2 % (ref 11.5–15.5)
WBC: 7.2 10*3/uL (ref 4.0–10.5)
nRBC: 0 % (ref 0.0–0.2)

## 2021-09-05 LAB — FERRITIN: Ferritin: 197 ng/mL (ref 11–307)

## 2021-09-05 LAB — IRON AND TIBC
Iron: 55 ug/dL (ref 28–170)
Saturation Ratios: 17 % (ref 10.4–31.8)
TIBC: 321 ug/dL (ref 250–450)
UIBC: 266 ug/dL

## 2021-09-05 NOTE — Patient Instructions (Addendum)
[facility] [facility] Discharge Instructions  You were seen today by [name] for your anemia.  Your blood counts and iron levels are much better -you do not need any IV iron at this time.  LABS: Return in 4 months for repeat labs  FOLLOW-UP APPOINTMENT: Phone visit in 4 months   Thank you for choosing [facility] to provide your oncology and hematology care.  To afford each patient quality time with our provider, please arrive at least 15 minutes before your scheduled appointment time.   If you have a lab appointment with the [facility] please come in thru the Main Entrance and check in at the main information desk.  You need to re-schedule your appointment should you arrive 10 or more minutes late.  We strive to give you quality time with our providers, and arriving late affects you and other patients whose appointments are after yours.  Also, if you no show three or more times for appointments you may be dismissed from the clinic at the providers discretion.     Again, thank you for choosing [facility].  Our hope is that these requests will decrease the amount of time that you wait before being seen by our physicians.       _____________________________________________________________  Should you have questions after your visit to [facility], please contact our office at [phone] and follow the prompts.  Our office hours are 8:00 a.m. and 4:30 p.m. Monday - Friday.  Please note that voicemails left after 4:00 p.m. may not be returned until the following business day.  We are closed weekends and major holidays.  You do have access to a nurse 24-7, just call the main number to the clinic [phone] and do not press any options, hold on the line and a nurse will answer the phone.    For prescription refill requests, have your pharmacy contact our office and allow 72 hours.     Due to Covid, you will need to wear a mask upon entering the hospital. If you do not have a mask, a mask will be given to you at the Main Entrance upon arrival. For doctor visits, patients may have 1 support person age 53 or older with them. For treatment visits, patients can not have anyone with them due to social distancing guidelines and our immunocompromised population.

## 2021-09-09 ENCOUNTER — Other Ambulatory Visit (HOSPITAL_COMMUNITY): Payer: Medicare PPO

## 2021-09-12 LAB — TYPE AND SCREEN
ABO/RH(D): O POS
Antibody Screen: NEGATIVE

## 2021-09-18 ENCOUNTER — Other Ambulatory Visit: Payer: Self-pay | Admitting: Gastroenterology

## 2021-09-18 DIAGNOSIS — K219 Gastro-esophageal reflux disease without esophagitis: Secondary | ICD-10-CM

## 2021-09-18 DIAGNOSIS — K2270 Barrett's esophagus without dysplasia: Secondary | ICD-10-CM

## 2021-10-22 ENCOUNTER — Ambulatory Visit: Payer: Medicare PPO | Admitting: Gastroenterology

## 2021-10-31 ENCOUNTER — Other Ambulatory Visit (HOSPITAL_COMMUNITY): Payer: Medicare PPO

## 2021-11-07 ENCOUNTER — Ambulatory Visit (HOSPITAL_COMMUNITY): Admission: RE | Admit: 2021-11-07 | Payer: Medicare PPO | Source: Home / Self Care | Admitting: Orthopedic Surgery

## 2021-11-07 ENCOUNTER — Encounter (HOSPITAL_COMMUNITY): Admission: RE | Payer: Self-pay | Source: Home / Self Care

## 2021-11-07 SURGERY — ARTHROPLASTY, SHOULDER, TOTAL, REVERSE
Anesthesia: Choice | Site: Shoulder | Laterality: Left

## 2022-01-01 ENCOUNTER — Other Ambulatory Visit (HOSPITAL_COMMUNITY): Payer: Medicare PPO

## 2022-01-01 ENCOUNTER — Inpatient Hospital Stay (HOSPITAL_COMMUNITY): Payer: Medicare PPO | Attending: Hematology

## 2022-01-01 DIAGNOSIS — E559 Vitamin D deficiency, unspecified: Secondary | ICD-10-CM | POA: Diagnosis not present

## 2022-01-01 DIAGNOSIS — D500 Iron deficiency anemia secondary to blood loss (chronic): Secondary | ICD-10-CM

## 2022-01-01 DIAGNOSIS — D509 Iron deficiency anemia, unspecified: Secondary | ICD-10-CM | POA: Diagnosis not present

## 2022-01-01 DIAGNOSIS — Z79899 Other long term (current) drug therapy: Secondary | ICD-10-CM | POA: Diagnosis not present

## 2022-01-01 DIAGNOSIS — E538 Deficiency of other specified B group vitamins: Secondary | ICD-10-CM

## 2022-01-01 LAB — CBC WITH DIFFERENTIAL/PLATELET
Abs Immature Granulocytes: 0.03 10*3/uL (ref 0.00–0.07)
Basophils Absolute: 0.1 10*3/uL (ref 0.0–0.1)
Basophils Relative: 1 %
Eosinophils Absolute: 0.2 10*3/uL (ref 0.0–0.5)
Eosinophils Relative: 2 %
HCT: 39 % (ref 36.0–46.0)
Hemoglobin: 13.4 g/dL (ref 12.0–15.0)
Immature Granulocytes: 0 %
Lymphocytes Relative: 20 %
Lymphs Abs: 1.6 10*3/uL (ref 0.7–4.0)
MCH: 31.1 pg (ref 26.0–34.0)
MCHC: 34.4 g/dL (ref 30.0–36.0)
MCV: 90.5 fL (ref 80.0–100.0)
Monocytes Absolute: 0.6 10*3/uL (ref 0.1–1.0)
Monocytes Relative: 7 %
Neutro Abs: 5.4 10*3/uL (ref 1.7–7.7)
Neutrophils Relative %: 70 %
Platelets: 223 10*3/uL (ref 150–400)
RBC: 4.31 MIL/uL (ref 3.87–5.11)
RDW: 14.6 % (ref 11.5–15.5)
WBC: 7.9 10*3/uL (ref 4.0–10.5)
nRBC: 0 % (ref 0.0–0.2)

## 2022-01-01 LAB — IRON AND TIBC
Iron: 84 ug/dL (ref 28–170)
Saturation Ratios: 29 % (ref 10.4–31.8)
TIBC: 293 ug/dL (ref 250–450)
UIBC: 209 ug/dL

## 2022-01-01 LAB — VITAMIN D 25 HYDROXY (VIT D DEFICIENCY, FRACTURES): Vit D, 25-Hydroxy: 35.98 ng/mL (ref 30–100)

## 2022-01-01 LAB — VITAMIN B12: Vitamin B-12: 936 pg/mL — ABNORMAL HIGH (ref 180–914)

## 2022-01-01 LAB — FERRITIN: Ferritin: 187 ng/mL (ref 11–307)

## 2022-01-02 ENCOUNTER — Encounter (HOSPITAL_COMMUNITY): Payer: Self-pay

## 2022-01-02 NOTE — Progress Notes (Signed)
Patient called requesting that her hormone levels be checked. Discussed request with Tarri Abernethy, PA-C who will defer hormone testing to PCP or GYN. Patient called and made aware, she verbalized understanding.

## 2022-01-02 NOTE — Progress Notes (Signed)
Virtual Visit via Telephone Note [facility]  I connected with Heidi [name]  on 01/03/22 at  1:37 PM by telephone and verified that I am speaking with the correct person using two identifiers.  Location: Patient: Home Provider: [facility]   I discussed the limitations, risks, security and privacy concerns of performing an evaluation and management service by telephone and the availability of in person appointments. I also discussed with the patient that there may be a patient responsible charge related to this service. The patient expressed understanding and agreed to proceed.  REASON FOR VISIT:  Follow-up for iron deficiency anemia   CURRENT THERAPY: Intermittent IV iron (last IV Venofer 06/03/2021)  INTERVAL HISTORY: Heidi [name] is contacted today for follow-up of her iron deficiency anemia.  She was last seen by [name] on 09/05/2021.  Patient reports that she has been doing about the same since her last visit.  She was recently diagnosed with lymphedema of the left leg.  She has not noticed any recent bleeding such as bright red blood per rectum or melena.  She continues to have significant chronic fatigue as well as restless legs, neuropathy, and some gait instability.  She denies any pica, headaches, chest pain, dyspnea on exertion, lightheadedness, and syncope.  She continues to take her vitamin D supplement and B12 injections at home. She has little to no energy and 70% appetite.  She endorses that she is maintaining a stable weight.    OBSERVATIONS/OBJECTIVE: Review of Systems  Constitutional:  Positive for malaise/fatigue. Negative for chills, diaphoresis, fever and weight loss.  Respiratory:  Negative for cough and shortness of breath.   Cardiovascular:  Negative for chest pain and palpitations.  Gastrointestinal:  Negative for abdominal pain, blood in stool, melena, nausea and vomiting.  Genitourinary:  Positive for  urgency.  Neurological:  Positive for tingling. Negative for dizziness and headaches.  Psychiatric/Behavioral:  Positive for depression. The patient is nervous/anxious.     PHYSICAL EXAM (per limitations of virtual telephone visit): The patient is alert and oriented x 3, exhibiting adequate mentation, good mood, and ability to speak in full sentences and execute sound judgement.   ASSESSMENT & PLAN: 1.  Microcytic iron deficient anemia secondary to chronic GI blood loss - Patient has had intermittent melena for the past year.  However, gastroenterology work-up thus far has not found specific source of bleeding. - EGD on 05/15/2020 which showed gastritis and one gastric polyp. - Colonoscopy on 08/14/2020 showed nonbleeding internal hemorrhoids, diverticulosis, and one polyp. - Patient is unable to have capsule endoscopy due to implanted pacemaker.   - Work-up for other causes of anemia was unremarkable: Normal SPEP/IFE/free light chains, normal B12/MMA, folate, copper; LDH normal; reticulocytes low at 2.2 likely hypoproliferation in the setting of iron deficiency; erythropoietin appropriately elevated at 119.4 - Unable to tolerate iron pill due to severe constipation - Most recent IV Venofer 06/03/2021. - No recent melena or hematochezia   - She continues to have severe chronic fatigue which is multifactorial   - Most recent labs (01/01/2022): Hgb 13.4/MCV 90.5, ferritin 187, iron saturation 29% - PLAN: No indication for IV iron.  Repeat labs and RTC in 6 months with phone visit.  2.  History of right breast DCIS - Patient does have a history of stage 0 breast cancer, multicentric DCIS of the right breast, s/p right mastectomy.  Pathology showed no residual disease, ER and PR status could not be assessed.  Patient was on norethindrone for abnormal uterine function at the time of her diagnosis.  She did not receive chemo or radiation; she was not placed on tamoxifen or anastrozole.  Her oncologist  discharged her to PCP for annual breast exams and mammograms. - Most recent mammogram in March 2022 reportedly normal   - PLAN: Continue annual mammograms and breast exams with PCP.  She reports that she is scheduled for her annual mammogram on 01/14/2022.   3.  Vitamin D deficiency - Labs (12/20/2020) show decreased vitamin D at 20.74 - Prescription sent prescription sent for daily vitamin D supplementation 2000 units - Most recent vitamin D (01/01/2022): Normal at 35.98 - PLAN: Continue taking vitamin D 2000 units daily. - We will recheck vitamin D at follow-up visit in 6 months   4.  Vitamin B12 deficiency - Patient reports that she gets B12 injections, self-administered at home - Most recent B12 (05/07/2021): Normal vitamin B12 at 936, methylmalonic acid pending - PLAN: Continue B12 injections at home.  We will recheck B12 and methylmalonic acid at follow-up visit in 6 months.   5.  Other history - PMH:  Other PMH includes type 2 diabetes mellitus, hypertension, tachybradycardia syndrome s/p pacemaker, Sjogren's disease, chronic pain syndrome (fibromyalgia and rheumatoid arthritis) on methadone, and other conditions noted elsewhere in medical record.  She is awaiting rotator cuff surgery of both shoulders.  She has chronic back pain and spinal stenosis.  She has sleep apnea, uses CPAP at home. - Social history: She lives at home with her husband.  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 She denies tobacco, alcohol, illicit drug use. - Family history:  Her family history is positive for paternal grandfather with colon cancer, paternal grandmother with ovarian cancer.   FOLLOW UP INSTRUCTIONS: Referral to lymphedema clinic Labs in 6 months Phone visit after labs    I discussed the assessment and treatment plan with the patient. The patient was provided an opportunity to ask questions and all were answered. The patient agreed with the plan and demonstrated  an understanding of the instructions.   The patient was advised to call back or seek an in-person evaluation if the symptoms worsen or if the condition fails to improve as anticipated.  I provided 18 minutes of non-face-to-face time during this encounter.   Harriett Rush, PA-C 01/03/2022 5:03 PM

## 2022-01-03 ENCOUNTER — Inpatient Hospital Stay (HOSPITAL_BASED_OUTPATIENT_CLINIC_OR_DEPARTMENT_OTHER): Payer: Medicare PPO | Admitting: Physician Assistant

## 2022-01-03 DIAGNOSIS — D500 Iron deficiency anemia secondary to blood loss (chronic): Secondary | ICD-10-CM

## 2022-01-03 DIAGNOSIS — E538 Deficiency of other specified B group vitamins: Secondary | ICD-10-CM | POA: Diagnosis not present

## 2022-01-03 DIAGNOSIS — E559 Vitamin D deficiency, unspecified: Secondary | ICD-10-CM | POA: Diagnosis not present

## 2022-01-03 DIAGNOSIS — I890 Lymphedema, not elsewhere classified: Secondary | ICD-10-CM | POA: Diagnosis not present

## 2022-01-06 LAB — METHYLMALONIC ACID, SERUM: Methylmalonic Acid, Quantitative: 217 nmol/L (ref 0–378)

## 2022-01-16 DIAGNOSIS — J95851 Ventilator associated pneumonia: Secondary | ICD-10-CM

## 2022-01-16 HISTORY — DX: Ventilator associated pneumonia: J95.851

## 2022-02-06 ENCOUNTER — Ambulatory Visit (HOSPITAL_COMMUNITY): Payer: Medicare PPO | Admitting: Physical Therapy

## 2022-02-07 ENCOUNTER — Encounter (HOSPITAL_COMMUNITY): Payer: Medicare PPO

## 2022-02-14 ENCOUNTER — Encounter (HOSPITAL_COMMUNITY): Payer: Medicare PPO

## 2022-02-20 ENCOUNTER — Encounter (HOSPITAL_COMMUNITY): Payer: Medicare PPO | Admitting: Physical Therapy

## 2022-02-21 ENCOUNTER — Encounter (HOSPITAL_COMMUNITY): Payer: Medicare PPO

## 2022-02-25 ENCOUNTER — Ambulatory Visit (HOSPITAL_COMMUNITY): Payer: Medicare PPO | Attending: Physician Assistant | Admitting: Physical Therapy

## 2022-02-25 DIAGNOSIS — I890 Lymphedema, not elsewhere classified: Secondary | ICD-10-CM | POA: Diagnosis not present

## 2022-02-25 NOTE — Therapy (Signed)
OUTPATIENT PHYSICAL THERAPY Lymphedema EVALUATION   Patient Name: Heidi [name] MRN: [id number] DOB:1956/02/20, 66 y.o., female Today's Date: 02/25/2022   PT End of Session - 02/25/22 1128     Visit Number 1    Number of Visits 18    Date for PT Re-Evaluation 04/08/22    Authorization Type [employer]    PT Start Time 1129   Pt late   PT Stop Time 1205    PT Time Calculation (min) 36 min    Activity Tolerance Patient tolerated treatment well    Behavior During Therapy WFL for tasks assessed/performed             Past Medical History:  Diagnosis Date   Anemia    takes iron supplement   Anxiety    Arthritis    Asthma    prn neb.   Cancer [facility])    right breast cancer   CHF (congestive heart failure) (HCC)    DDD (degenerative disc disease), cervical    Depression    Diabetes mellitus    IDDM   Diverticulitis    Diverticulitis large intestine    Diverticulosis    Environmental allergies    Fibromyalgia    GERD (gastroesophageal reflux disease)    Headache(784.0)    migraines   History of MRSA infection 08/2012   right breast   Hypertension    under control with med., has been on med. x 10-15 yr.   Overactive bladder    Pneumonia    PONV (postoperative nausea and vomiting)    used scop patch 7/14-please use again per pt request   Presence of permanent cardiac pacemaker    Restless leg syndrome    Sjogren's disease ([place])    Sleep apnea    uses CPAP nightly   Past Surgical History:  Procedure Laterality Date   ANKLE ARTHROSCOPY Left    ANKLE FUSION Left    BACK SURGERY     BIOPSY  05/15/2020   Procedure: BIOPSY;  Surgeon: Eloise Harman, DO;  [facility];  Service: Endoscopy;;  gastric    BREAST RECONSTRUCTION  05/24/2012   Procedure: BREAST RECONSTRUCTION;  Surgeon: Cristine Polio, MD;  Location: [place];  Service: Plastics;  Laterality: Right;  NO FLEX HD   BREAST RECONSTRUCTION Right 01/17/2013   Procedure: RIGHT  BREAST RECONSTRUCTION WITH PLACEMENT OF TISSUE EXPANDER ;  Surgeon: Cristine Polio, MD;  Location: [place];  Service: Plastics;  Laterality: Right;   BREAST REDUCTION SURGERY Left 02/28/2013   Procedure: LEFT BREAST REDUCTION  (BREAST);  Surgeon: Cristine Polio, MD;  Location: [place];  Service: Plastics;  Laterality: Left;   CESAREAN SECTION     CHOLECYSTECTOMY     COLONOSCOPY WITH PROPOFOL N/A 08/14/2020   non-bleeding internal hemorrhoids, multiple small mouth diverticula, 5 mm polyp in descending colon, fair prep. Tubular adenoma. Surveillance in 5 years.    ESOPHAGOGASTRODUODENOSCOPY (EGD) WITH PROPOFOL N/A 05/15/2020   gastritis, reactive gastropathy. negative H.pylori. one gastric polyp. normal duodenum   INSERT / REPLACE / REMOVE PACEMAKER     MASTECTOMY Right 03/2012   NASAL SEPTUM SURGERY     POLYPECTOMY  05/15/2020   Procedure: POLYPECTOMY;  Surgeon: Eloise Harman, DO;  [facility];  Service: Endoscopy;;  antral   POLYPECTOMY  08/14/2020   Procedure: POLYPECTOMY;  Surgeon: Eloise Harman, DO;  [facility];  Service: Endoscopy;;   REMOVAL OF TISSUE EXPANDER AND PLACEMENT  OF IMPLANT Right 05/10/2013   Procedure: RIGHT REMOVAL OF TISSUE EXPANDER ;  Surgeon: Cristine Polio, MD;  Location: [place];  Service: Plastics;  Laterality: Right;   REVERSE SHOULDER ARTHROPLASTY Right 03/07/2021   Procedure: REVERSE SHOULDER ARTHROPLASTY;  Surgeon: Tania Ade, MD;  Location: [facility];  Service: Orthopedics;  Laterality: Right;   SHOULDER ARTHROSCOPY Right    SHOULDER ARTHROSCOPY WITH SUBACROMIAL DECOMPRESSION Left 06/20/2021   Procedure: SHOULDER ARTHROSCOPY ROTATOR CUFF DEBRIDEMENT WITH SUBACROMIAL DECOMPRESSION, BICEPS TENOTOMY;  Surgeon: Tania Ade, MD;  Location: [facility];  Service: Orthopedics;  Laterality: Left;   TISSUE EXPANDER PLACEMENT  05/24/2012   Procedure: TISSUE EXPANDER;  Surgeon:  Cristine Polio, MD;  Location: [place];  Service: Plastics;  Laterality: Right;   Patient Active Problem List   Diagnosis Date Noted   Diverticulitis 10/14/2020   Severe sepsis ([place]) 10/14/2020   Near syncope 10/14/2020   High anion gap metabolic acidosis 28/00/3491   AKI (acute kidney injury) ([place]) 10/14/2020   Hypomagnesemia 10/14/2020   Type 2 diabetes mellitus with hyperlipidemia ([place]) 10/14/2020   Iron deficiency anemia due to chronic blood loss 04/10/2020   Dark stools 04/10/2020   GERD (gastroesophageal reflux disease) 08/04/2017   Sjogren's disease ([place]) 08/04/2017   Barrett's esophagus 02/05/2017   Constipation 02/05/2017   Gastric polyps 02/05/2017   History of colonic polyps 02/05/2017   Abdominal pain 02/05/2017   Loss of weight 02/05/2017   Wound infection after surgery 09/23/2012    PCP: Thornton Papas  REFERRING PROVIDER: Harriett Rush, PA-C      REFERRING DIAG: I89.0 (ICD-10-CM) - Lymphedema  THERAPY DIAG:  lymphedema.  Rationale for Evaluation and Treatment Rehabilitation  ONSET DATE: 10/25/21  SUBJECTIVE:   SUBJECTIVE STATEMENT: [name] states that she was diagnosed with Lymphedema in March.  She was told to wear compression garments but she is having difficulty getting them on.   She states she was given a prescription for a juxtafit when she told her MD that she was having difficulty donning the compression garment.    She does not have a pump.  She has had cellulitis 2 times before.   PERTINENT HISTORY: Rt breast Cancer, - PMH:  Other PMH includes type 2 diabetes mellitus, hypertension, tachybradycardia syndrome s/p pacemaker, Sjogren's disease, chronic pain syndrome (fibromyalgia and rheumatoid arthritis) on methadone, and other conditions noted elsewhere in medical record.  She is awaiting rotator cuff surgery of both shoulders.  She has chronic back pain and spinal stenosis.  She has sleep apnea, uses CPAP at  home.  PAIN:  Are you having pain? no  PRECAUTIONS: None  WEIGHT BEARING RESTRICTIONS No  FALLS:  Has patient fallen in last 6 months? No  LIVING ENVIRONMENT: Lives with: lives with their spouse Lives in: House/apartment OCCUPATION: retired disability due to back issues   PLOF: Independent with household mobility with device  PATIENT GOALS less swelling   OBJECTIVE:     COGNITION:  Overall cognitive status: Within functional limits for tasks assessed     SENSATION: Not tested  PALPATION: Noted induration, hyperplasia, hyperpigmentation and hyperkeratosis   LE LANDMARK RIGHT Eval 7/11    At groin      30 cm proximal to suprapatella      20 cm proximal to suprapatella 63.5   10 cm proximal to suprapatella 60   At midpatella / popliteal crease 57   30 cm proximal to floor at lateral plantar foot 43.8   20 cm proximal  to floor at lateral plantar foot 34.8   10 cm proximal to floor at lateral plantar foot 27   Circumference of ankle/heel 35.5   5 cm proximal to 1st MTP joint 24.8   Across MTP joint 23.5   Around proximal great toe     (Blank rows = not tested)   LE LANDMARK LEFT Eval 7/11 Left 02/24/22  At groin      30 cm proximal to suprapatella      20 cm proximal to suprapatella 67   10 cm proximal to suprapatella 59   At midpatella / popliteal crease 54.5   30 cm proximal to floor at lateral plantar foot 47.7    20 cm proximal to floor at lateral plantar foot 38.2   10 cm proximal to floor at lateral plantar foot 30.5   Circumference of ankle/heel 36.3   5 cm proximal to 1st MTP joint 24.5   Across MTP joint 24.8   Around proximal great toe    (Blank rows = not tested)    TODAY'S TREATMENT: Evaluation:  Explained what lymphedema is, that there is no cure but four aspects of treatment. 1) Skin care; 2) exercise; 3)manual and 4) compression.  Pt shown and given exercise sheet.  Explained that we would go over self manual next treatment. Pt given  sheet to order compression bandages.    PATIENT EDUCATION:  Education details: HEP Person educated: Patient Education method: Education officer, environmental, and Handouts Education comprehension: verbalized understanding and returned demonstration   HOME EXERCISE PROGRAM: Ankle pumps, LAQ, hip ab/adduction, marching, diaphragm breathing, lymphatic squeeze.  ASSESSMENT:  CLINICAL IMPRESSION: Patient is a 66 y.o. female who was seen today for physical therapy evaluation and treatment for lymphedema. Evaluation demonstrates increased induration, decreased activity tolerance, and history of cellulitis.  Ms. Macmaster will benefit from skilled PT for education on how to decrease her edema to decrease her risk of cellulitis and improve her mobility.    OBJECTIVE IMPAIRMENTS decreased activity tolerance, difficulty walking, and increased edema.   ACTIVITY LIMITATIONS locomotion level  PARTICIPATION LIMITATIONS: shopping and community activity  PERSONAL FACTORS Fitness and 1 comorbidity: chronic pain  are also affecting patient's functional outcome.   REHAB POTENTIAL: Good  CLINICAL DECISION MAKING: Stable/uncomplicated  EVALUATION COMPLEXITY: Moderate   GOALS: Goals reviewed with patient? No  SHORT TERM GOALS: Target date: 03/18/2022  Pt to be I in HEP to improve lymphatic circulation  Baseline: Goal status: INITIAL  2.  Pt to be completing self manual techniques at home to increase lymphatic circulation  Baseline:  Goal status: INITIAL  3.  PT to have lost 2-3 cm of fluid at measured areas. Baseline:  Goal status: INITIAL    LONG TERM GOALS: Target date: 04/08/2022   PT to have obtained and be using a compression pump Baseline:  Goal status: INITIAL  2.  PT to have obtained and be using a compression garment Baseline:  Goal status: INITIAL  3.  Pt to have lost at least 5 cm of fluid in upper leg 3 in ankle/heel area to decrease risk of cellulitis  Baseline:  Goal  status: INITIAL   PLAN: PT FREQUENCY: 3x/week  PT DURATION: 6 weeks  PLANNED INTERVENTIONS: Therapeutic exercises, Patient/Family education, Manual lymph drainage, Compression bandaging, and Manual therapy  PLAN FOR NEXT SESSION: begin education for self manual, cut foam in preparation for compression bandaging and begin manual decongestive techniques.  Rayetta Humphrey, PT CLT [phone]  02/25/2022, 15:14 PM

## 2022-02-27 ENCOUNTER — Ambulatory Visit (HOSPITAL_COMMUNITY): Payer: Medicare PPO | Admitting: Physical Therapy

## 2022-02-28 ENCOUNTER — Ambulatory Visit (HOSPITAL_COMMUNITY): Payer: Medicare PPO

## 2022-02-28 IMAGING — DX DG CHEST 1V PORT
1 series · 1 of 1 positions shown · non-contrast
Comparison: None.

CLINICAL DATA: Near syncope

EXAM:
PORTABLE CHEST 1 VIEW

[chest ap]
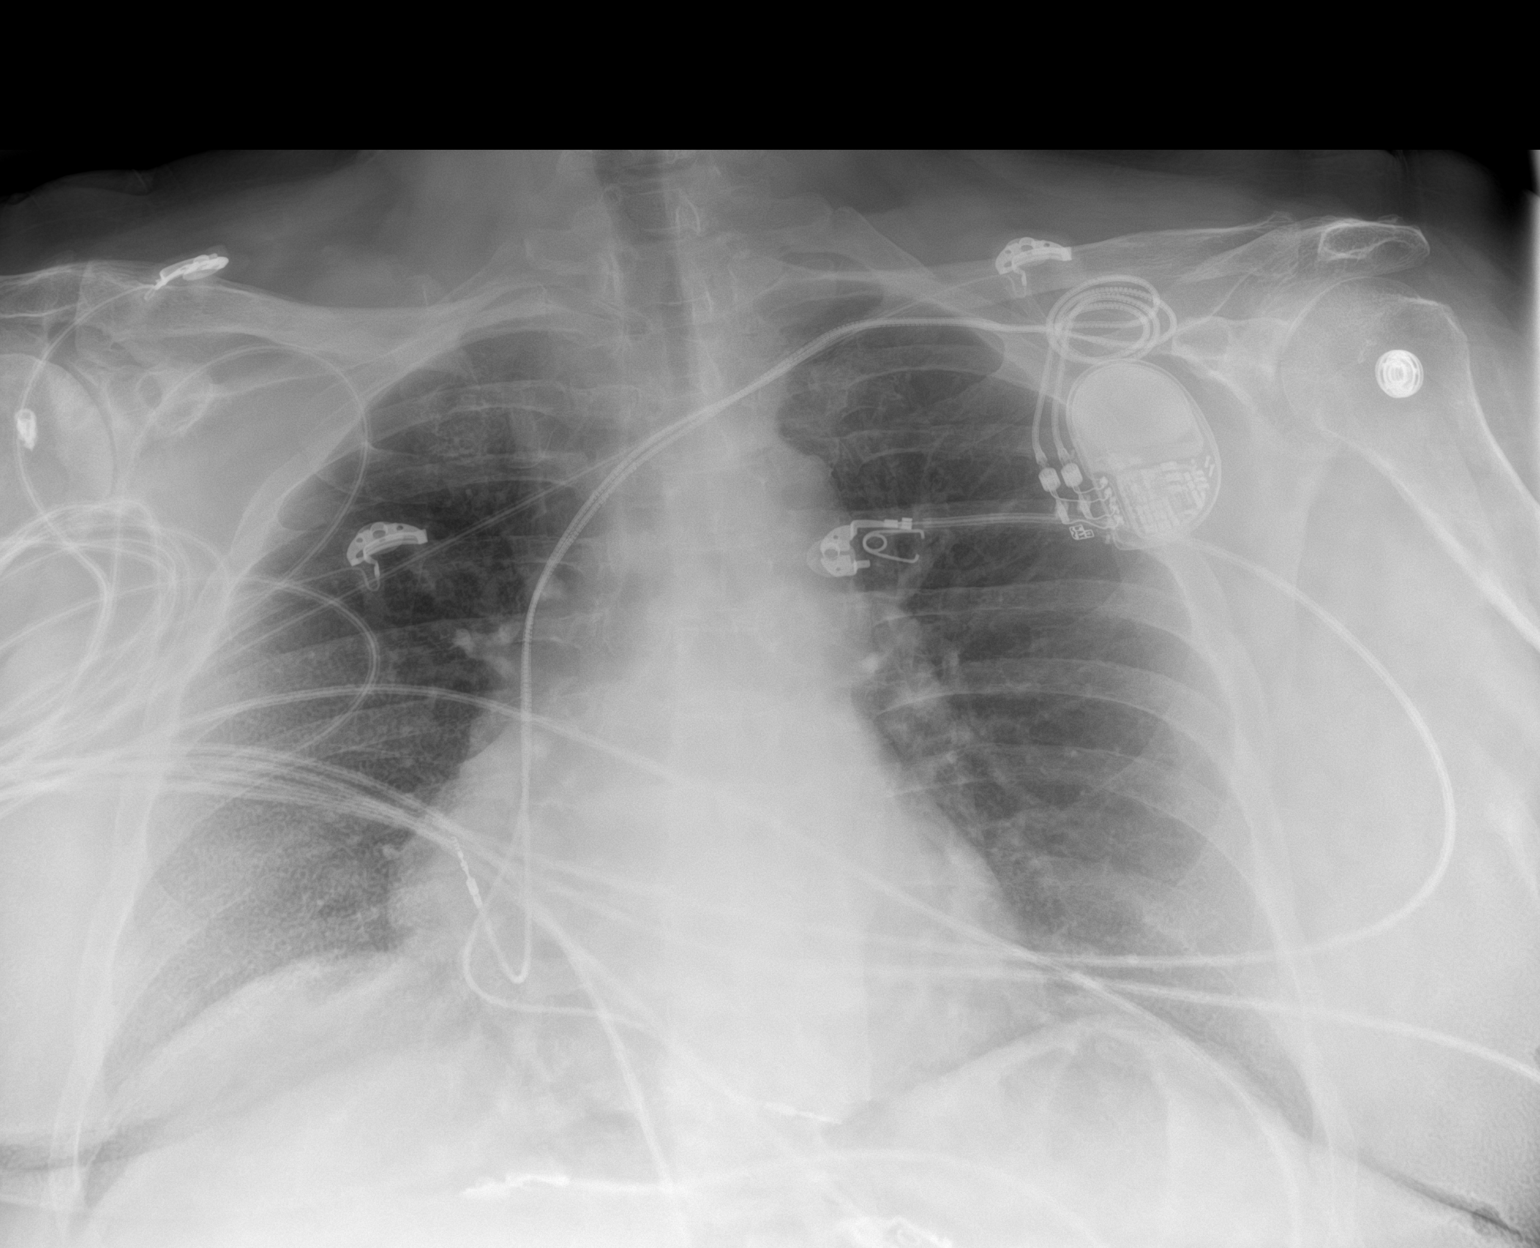

[1 of 1 positions shown; findings below may reference images not displayed]

FINDINGS: The heart size and mediastinal contours are within normal limits. A
right-sided pacemaker seen with the lead tips in the right atrium
right ventricle. Both lungs are clear. The visualized skeletal
structures are unremarkable.
IMPRESSION: No active disease.

## 2022-03-01 IMAGING — CT CT ABD-PELV W/ CM
2 of 5 series · 16 of 46 positions shown, 18 images · IV contrast (omnipaque)
Comparison: None.

CLINICAL DATA: Weakness

EXAM:
CT ABDOMEN AND PELVIS WITH CONTRAST
TECHNIQUE: Multidetector CT imaging of the abdomen and pelvis was performed
using the standard protocol following bolus administration of
intravenous contrast.
CONTRAST:  80mL OMNIPAQUE IOHEXOL 300 MG/ML  SOLN

[Series 3: abd/ pelvis 5.0 i30f 2 · axial · 0.98mm/px · z∈[+788,+1238]mm · 13 of 102 slices shown, 15 images]
[im 6/102  soft-tissue]
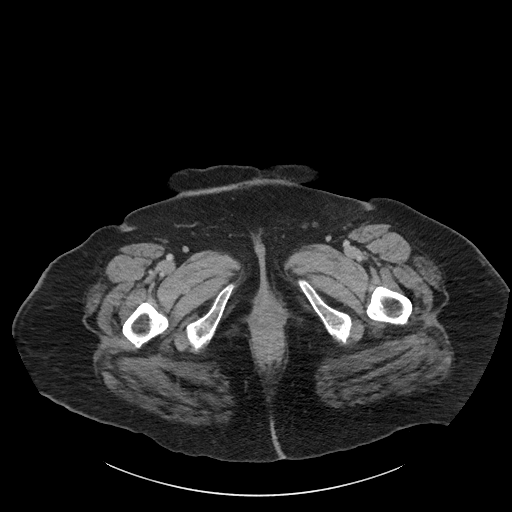
[im 6/102  bone]
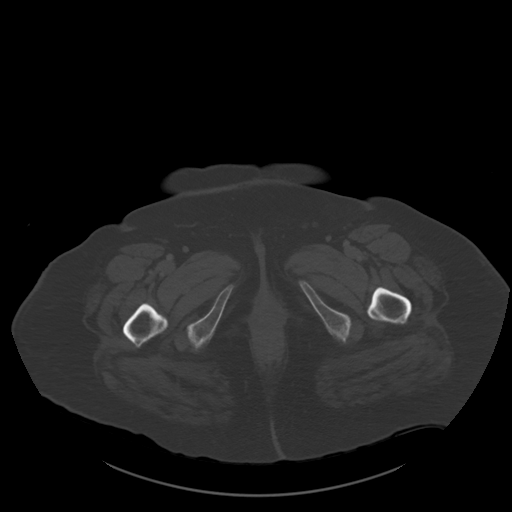
[im 12/102  soft-tissue]
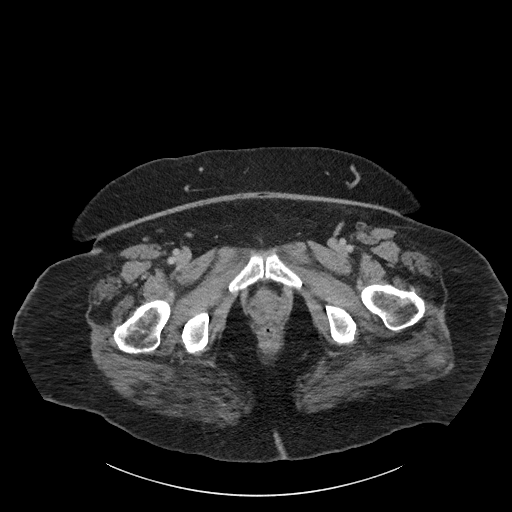
[im 23/102  soft-tissue]
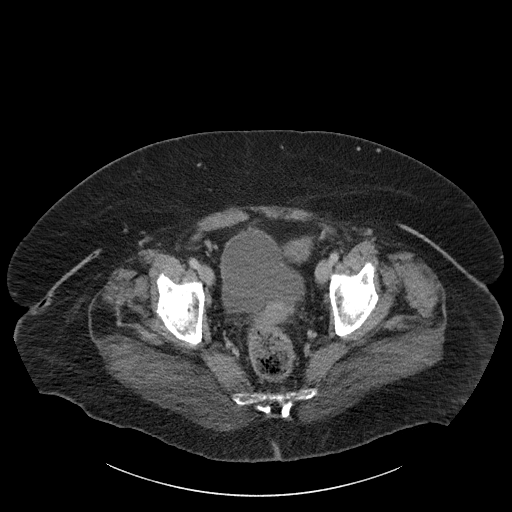
[im 29/102  soft-tissue]
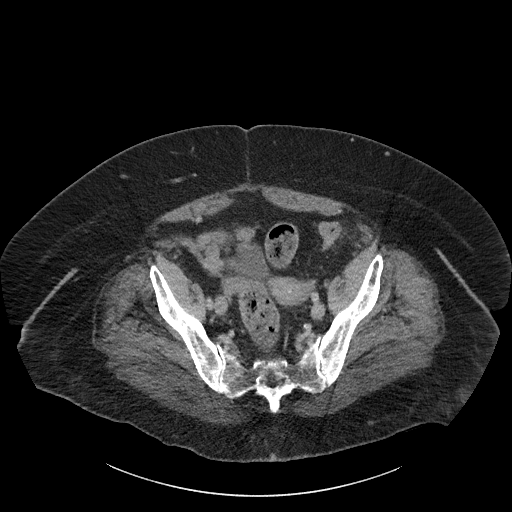
[im 34/102  soft-tissue]
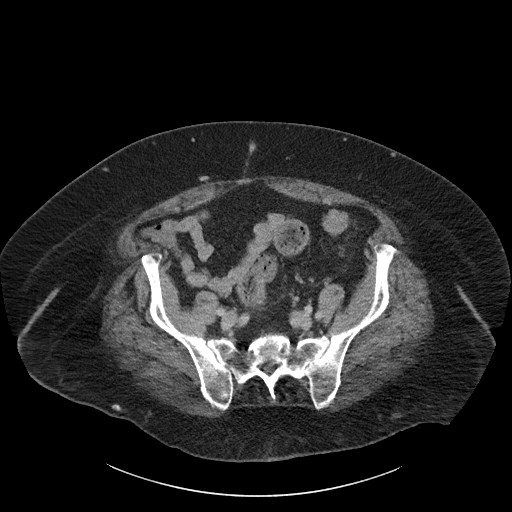
[im 45/102  soft-tissue]
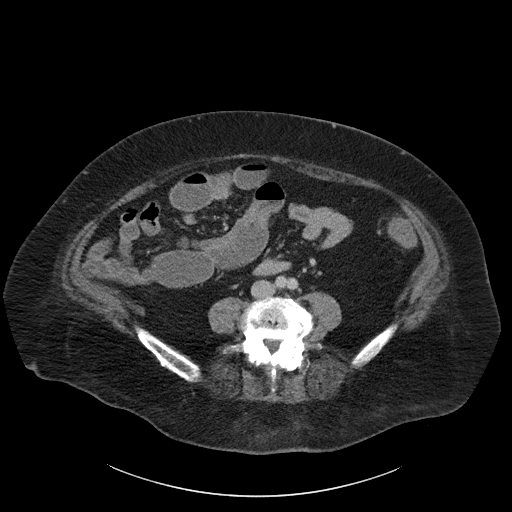
[im 51/102  soft-tissue]
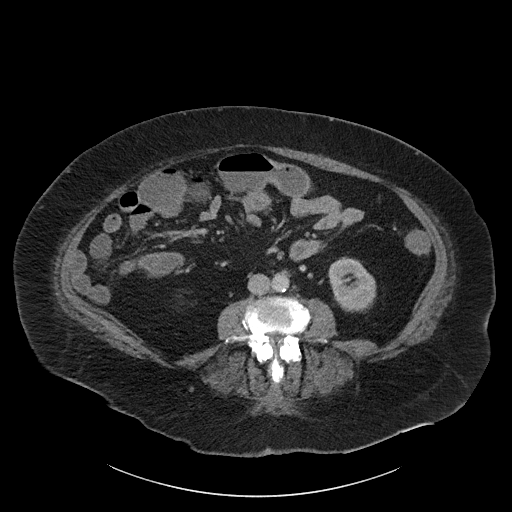
[im 57/102  soft-tissue]
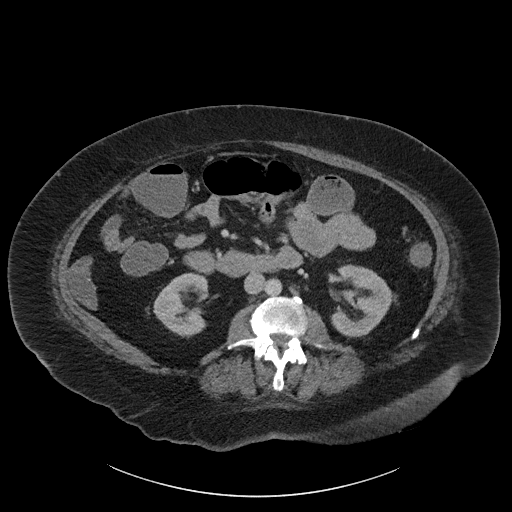
[im 68/102  soft-tissue]
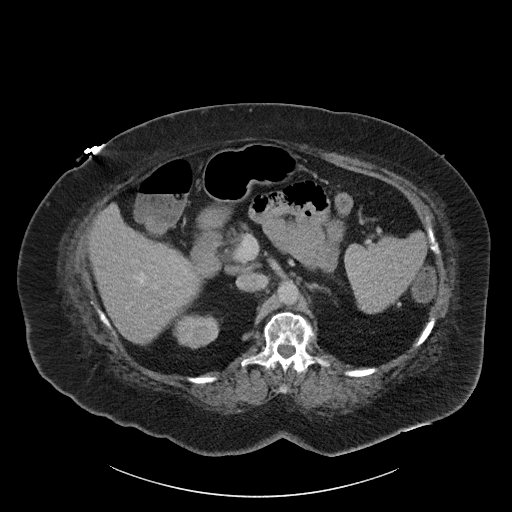
[im 68/102  bone]
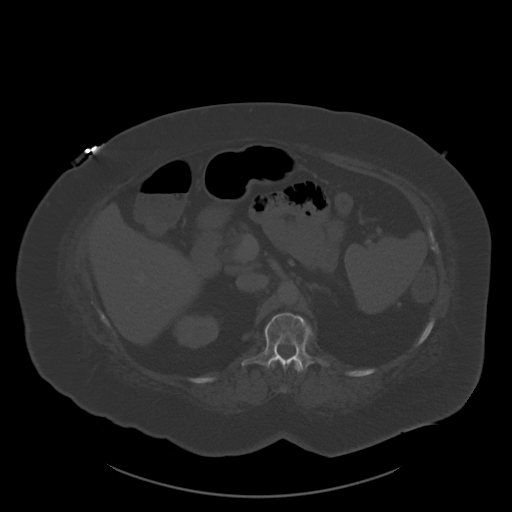
[im 73/102  soft-tissue]
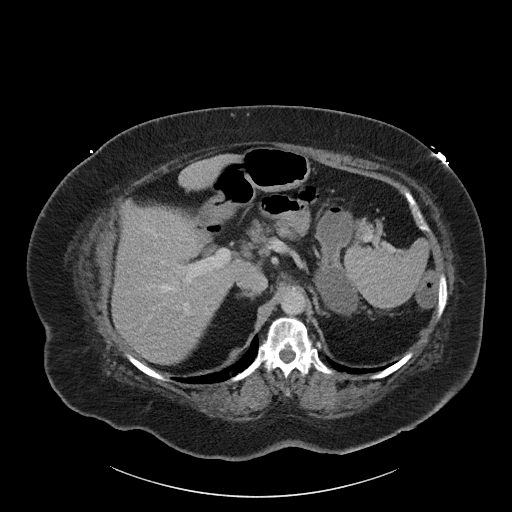
[im 79/102  soft-tissue]
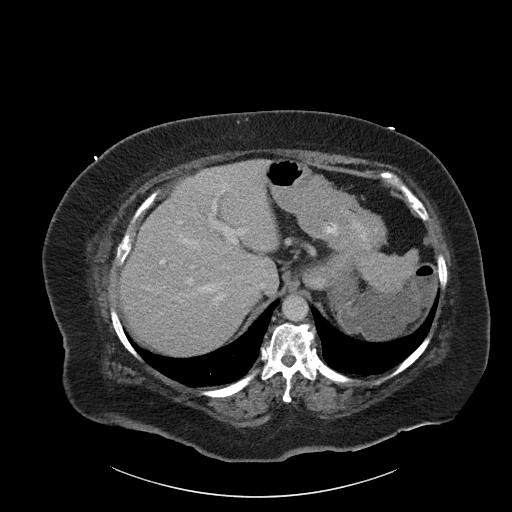
[im 90/102  soft-tissue]
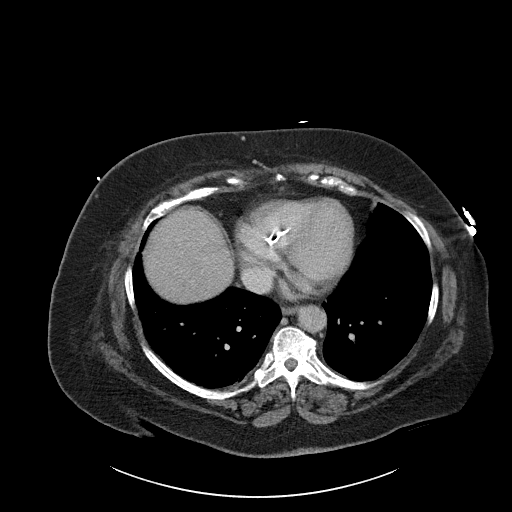
[im 96/102  soft-tissue]
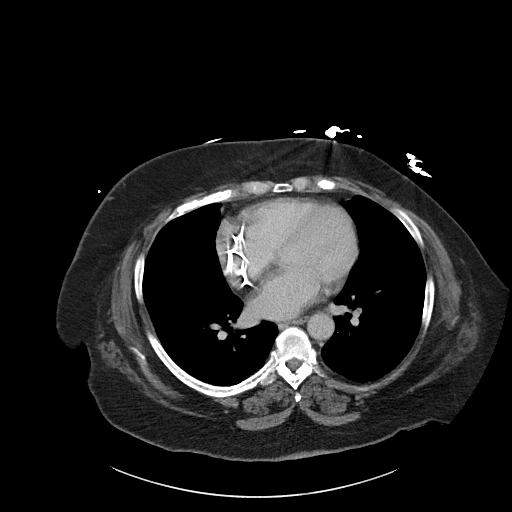

[Series 6: coronal soft tissue · coronal · 0.97mm/px · 3 of 120 slices shown]
[im 40/120  soft-tissue]
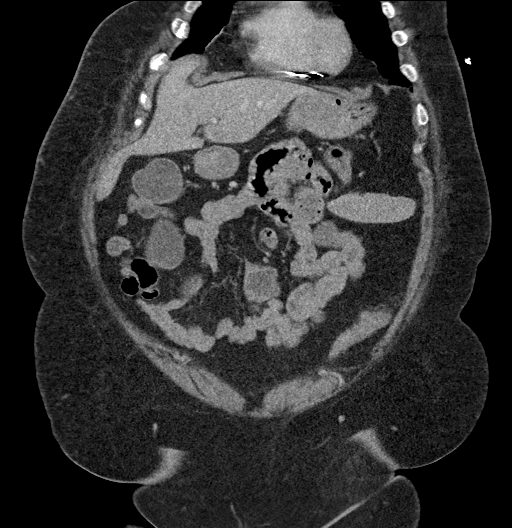
[im 53/120  soft-tissue]
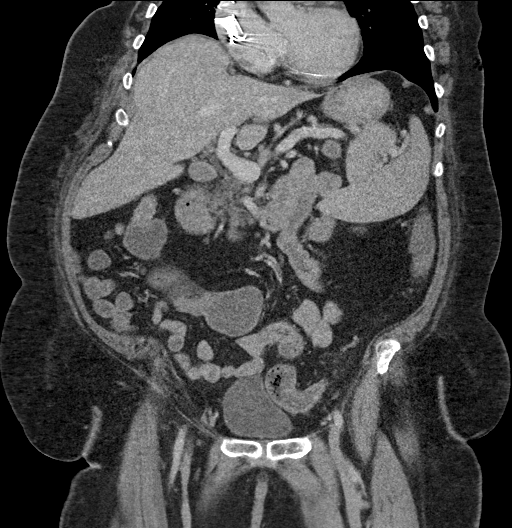
[im 67/120  soft-tissue]
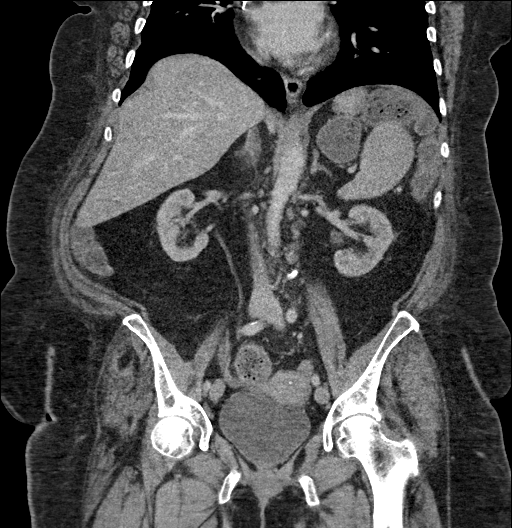

[16 of 46 positions shown; findings below may reference images not displayed]

FINDINGS: Lower chest: The visualized heart size within normal limits. No
pericardial fluid/thickening.

No hiatal hernia.

The visualized portions of the lungs are clear.

Hepatobiliary: The liver is normal in density without focal
abnormality.The main portal vein is patent. No evidence of calcified
gallstones, gallbladder wall thickening or biliary dilatation.

Pancreas: Unremarkable. No pancreatic ductal dilatation or
surrounding inflammatory changes.

Spleen: Normal in size without focal abnormality.

Adrenals/Urinary Tract: Both adrenal glands appear normal. The
kidneys and collecting system appear normal without evidence of
urinary tract calculus or hydronephrosis. Bladder is unremarkable.

Stomach/Bowel: The stomach and proximal small bowel are
unremarkable. There is a mildly dilated fluid and air-filled loop of
distal ileum measuring up to 3.2 cm. No wall thickening or
transition point. The terminal ileum is decompressed. There appears
to be question of mild wall thickening with stranding changes seen
within the proximal sigmoid colon with scattered diverticula. A
moderate amount of colonic stool seen throughout. No loculated fluid
collections or free air.

Vascular/Lymphatic: There are no enlarged mesenteric,
retroperitoneal, or pelvic lymph nodes. Scattered aortic
atherosclerotic calcifications are seen without aneurysmal
dilatation.

Reproductive: No abnormality seen within the deep pelvis.

Other: No evidence of abdominal wall mass or hernia.

Musculoskeletal: No acute or significant osseous findings.
IMPRESSION: Findings which may be suggestive of focal mild sigmoid colonic
diverticulitis. No pericolonic fluid collections or free air.

Mildly dilated focal segment of distal ileum which could be due to a
ileus.

Aortic Atherosclerosis ([id number]-ZC7.7).

## 2022-03-03 ENCOUNTER — Encounter (HOSPITAL_COMMUNITY): Payer: Medicare PPO | Admitting: Physical Therapy

## 2022-03-04 ENCOUNTER — Encounter (HOSPITAL_COMMUNITY): Payer: Medicare PPO

## 2022-03-05 ENCOUNTER — Encounter (HOSPITAL_COMMUNITY): Payer: Medicare PPO

## 2022-03-06 ENCOUNTER — Encounter (HOSPITAL_COMMUNITY): Payer: Medicare PPO

## 2022-03-07 ENCOUNTER — Ambulatory Visit (HOSPITAL_COMMUNITY): Payer: Medicare PPO

## 2022-03-11 ENCOUNTER — Encounter (HOSPITAL_COMMUNITY): Payer: Medicare PPO | Admitting: Physical Therapy

## 2022-03-11 ENCOUNTER — Telehealth (HOSPITAL_COMMUNITY): Payer: Self-pay | Admitting: Physical Therapy

## 2022-03-11 ENCOUNTER — Encounter (HOSPITAL_COMMUNITY): Payer: Self-pay | Admitting: Physical Therapy

## 2022-03-11 NOTE — Therapy (Unsigned)
[facility] [place], [place], [address] Phone: [phone]   Fax:  [phone]  Patient Details  Name: Heidi [name] MRN: [id number] Date of Birth: [birth year] Referring Provider:  No ref. provider found  Encounter Date: 03/11/2022 PHYSICAL THERAPY DISCHARGE SUMMARY  Visits from Start of Care: 1  Current functional level related to goals / functional outcomes: No goals met one time visit pt did not return.   Remaining deficits: No goals met one time visit pt did not return.     Education / Equipment: No goals met one time visit pt did not return.     Patient agrees to discharge. Patient goals were not met. Patient is being discharged due to not returning since the last visit.  Rayetta Humphrey, PT CLT [phone] 03/11/2022, 9:23 AM  [facility] [address] [place], [place], [address] Phone: [phone]   Fax:  [phone]

## 2022-03-11 NOTE — Telephone Encounter (Signed)
Pt has not been seen since evaluation on 02/25/22.  She has cancelled 6 visits and no showed for one.  Therapist left a message that we are discharging the pt at this time.  If she would like to resume she will need to reschedule.  Rayetta Humphrey, [place] CLT [phone]

## 2022-03-12 ENCOUNTER — Encounter (HOSPITAL_COMMUNITY): Payer: Medicare PPO | Admitting: Physical Therapy

## 2022-03-17 ENCOUNTER — Encounter (HOSPITAL_COMMUNITY): Payer: Medicare PPO | Admitting: Physical Therapy

## 2022-03-19 ENCOUNTER — Encounter (HOSPITAL_COMMUNITY): Payer: Medicare PPO | Admitting: Physical Therapy

## 2022-03-20 ENCOUNTER — Encounter (HOSPITAL_COMMUNITY): Payer: Medicare PPO | Admitting: Physical Therapy

## 2022-03-21 ENCOUNTER — Encounter (HOSPITAL_COMMUNITY): Payer: Medicare PPO

## 2022-03-24 ENCOUNTER — Encounter (HOSPITAL_COMMUNITY): Payer: Medicare PPO | Admitting: Physical Therapy

## 2022-03-25 ENCOUNTER — Encounter (HOSPITAL_COMMUNITY): Payer: Medicare PPO | Admitting: Physical Therapy

## 2022-03-26 ENCOUNTER — Encounter (HOSPITAL_COMMUNITY): Payer: Medicare PPO | Admitting: Physical Therapy

## 2022-03-27 ENCOUNTER — Encounter (HOSPITAL_COMMUNITY): Payer: Medicare PPO

## 2022-03-28 ENCOUNTER — Encounter (HOSPITAL_COMMUNITY): Payer: Medicare PPO | Admitting: Physical Therapy

## 2022-04-01 ENCOUNTER — Encounter (HOSPITAL_COMMUNITY): Payer: Medicare PPO

## 2022-04-03 ENCOUNTER — Encounter (HOSPITAL_COMMUNITY): Payer: Medicare PPO

## 2022-04-04 ENCOUNTER — Encounter (HOSPITAL_COMMUNITY): Payer: Medicare PPO

## 2022-04-08 ENCOUNTER — Encounter (HOSPITAL_COMMUNITY): Payer: Medicare PPO | Admitting: Physical Therapy

## 2022-04-10 ENCOUNTER — Encounter (HOSPITAL_COMMUNITY): Payer: Medicare PPO | Admitting: Physical Therapy

## 2022-04-11 ENCOUNTER — Encounter (HOSPITAL_COMMUNITY): Payer: Medicare PPO

## 2022-04-14 ENCOUNTER — Encounter (HOSPITAL_COMMUNITY): Payer: Medicare PPO | Admitting: Physical Therapy

## 2022-04-16 ENCOUNTER — Encounter (HOSPITAL_COMMUNITY): Payer: Medicare PPO

## 2022-04-18 ENCOUNTER — Encounter (HOSPITAL_COMMUNITY): Payer: Medicare PPO

## 2022-04-22 ENCOUNTER — Encounter (HOSPITAL_COMMUNITY): Payer: Medicare PPO | Admitting: Physical Therapy

## 2022-04-24 ENCOUNTER — Encounter (HOSPITAL_COMMUNITY): Payer: Medicare PPO | Admitting: Physical Therapy

## 2022-06-20 ENCOUNTER — Encounter (HOSPITAL_COMMUNITY): Payer: Self-pay | Admitting: Hematology

## 2022-06-27 ENCOUNTER — Inpatient Hospital Stay: Payer: Medicare PPO

## 2022-07-04 ENCOUNTER — Telehealth: Payer: Medicare PPO | Admitting: Physician Assistant

## 2022-07-23 IMAGING — DX DG SHOULDER 2+V PORT*R*
1 series · 1 of 1 positions shown · non-contrast
Comparison: None.

CLINICAL DATA: Status post reverse total shoulder arthroplasty.

EXAM:
PORTABLE RIGHT SHOULDER

[shoulder ap]
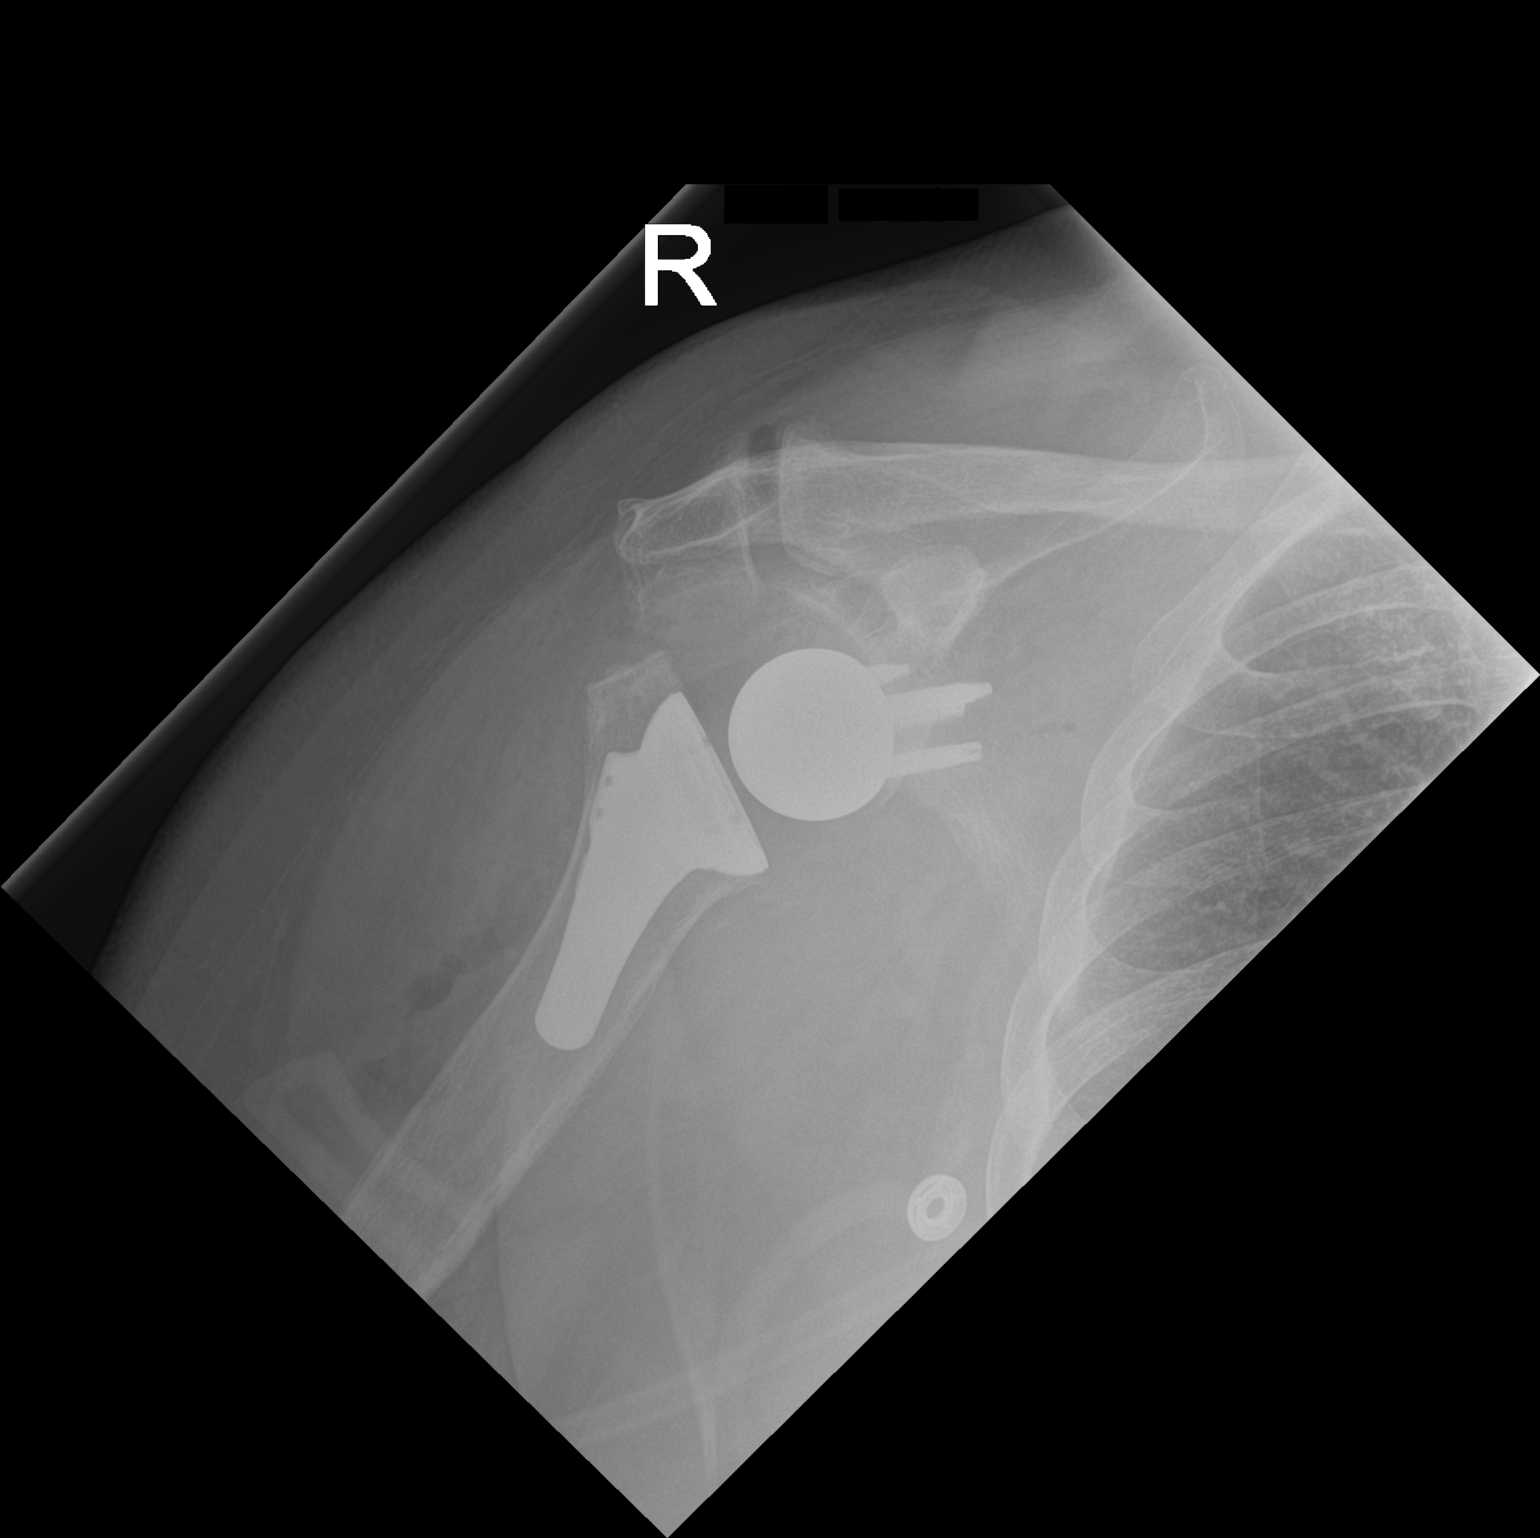

[1 of 1 positions shown; findings below may reference images not displayed]

FINDINGS: Reverse shoulder arthroplasty in expected alignment. No
periprosthetic lucency or fracture. Recent postsurgical change
includes air and edema in the soft tissues.
IMPRESSION: Reverse shoulder arthroplasty in expected alignment. No immediate
postoperative complication.

## 2022-08-22 ENCOUNTER — Encounter (HOSPITAL_COMMUNITY): Payer: Self-pay | Admitting: Hematology

## 2022-08-22 ENCOUNTER — Inpatient Hospital Stay: Payer: Medicare Other | Attending: Physician Assistant

## 2022-08-22 DIAGNOSIS — E538 Deficiency of other specified B group vitamins: Secondary | ICD-10-CM | POA: Diagnosis not present

## 2022-08-22 DIAGNOSIS — M069 Rheumatoid arthritis, unspecified: Secondary | ICD-10-CM | POA: Diagnosis not present

## 2022-08-22 DIAGNOSIS — I10 Essential (primary) hypertension: Secondary | ICD-10-CM | POA: Insufficient documentation

## 2022-08-22 DIAGNOSIS — E559 Vitamin D deficiency, unspecified: Secondary | ICD-10-CM | POA: Insufficient documentation

## 2022-08-22 DIAGNOSIS — M3500 Sicca syndrome, unspecified: Secondary | ICD-10-CM | POA: Insufficient documentation

## 2022-08-22 DIAGNOSIS — E119 Type 2 diabetes mellitus without complications: Secondary | ICD-10-CM | POA: Diagnosis not present

## 2022-08-22 DIAGNOSIS — K921 Melena: Secondary | ICD-10-CM | POA: Insufficient documentation

## 2022-08-22 DIAGNOSIS — G4730 Sleep apnea, unspecified: Secondary | ICD-10-CM | POA: Insufficient documentation

## 2022-08-22 DIAGNOSIS — Z8041 Family history of malignant neoplasm of ovary: Secondary | ICD-10-CM | POA: Insufficient documentation

## 2022-08-22 DIAGNOSIS — Z800 Family history of malignant neoplasm of digestive organs: Secondary | ICD-10-CM | POA: Diagnosis not present

## 2022-08-22 DIAGNOSIS — M797 Fibromyalgia: Secondary | ICD-10-CM | POA: Insufficient documentation

## 2022-08-22 DIAGNOSIS — D500 Iron deficiency anemia secondary to blood loss (chronic): Secondary | ICD-10-CM

## 2022-08-22 DIAGNOSIS — G894 Chronic pain syndrome: Secondary | ICD-10-CM | POA: Insufficient documentation

## 2022-08-22 DIAGNOSIS — Z950 Presence of cardiac pacemaker: Secondary | ICD-10-CM | POA: Insufficient documentation

## 2022-08-22 DIAGNOSIS — Z86000 Personal history of in-situ neoplasm of breast: Secondary | ICD-10-CM | POA: Diagnosis not present

## 2022-08-22 LAB — CBC WITH DIFFERENTIAL/PLATELET
Abs Immature Granulocytes: 0.05 10*3/uL (ref 0.00–0.07)
Basophils Absolute: 0.1 10*3/uL (ref 0.0–0.1)
Basophils Relative: 1 %
Eosinophils Absolute: 0.3 10*3/uL (ref 0.0–0.5)
Eosinophils Relative: 4 %
HCT: 37.4 % (ref 36.0–46.0)
Hemoglobin: 12.3 g/dL (ref 12.0–15.0)
Immature Granulocytes: 1 %
Lymphocytes Relative: 24 %
Lymphs Abs: 1.7 10*3/uL (ref 0.7–4.0)
MCH: 30.4 pg (ref 26.0–34.0)
MCHC: 32.9 g/dL (ref 30.0–36.0)
MCV: 92.6 fL (ref 80.0–100.0)
Monocytes Absolute: 0.5 10*3/uL (ref 0.1–1.0)
Monocytes Relative: 7 %
Neutro Abs: 4.5 10*3/uL (ref 1.7–7.7)
Neutrophils Relative %: 63 %
Platelets: 184 10*3/uL (ref 150–400)
RBC: 4.04 MIL/uL (ref 3.87–5.11)
RDW: 13.7 % (ref 11.5–15.5)
WBC: 7 10*3/uL (ref 4.0–10.5)
nRBC: 0 % (ref 0.0–0.2)

## 2022-08-22 LAB — COMPREHENSIVE METABOLIC PANEL
ALT: 11 U/L (ref 0–44)
AST: 16 U/L (ref 15–41)
Albumin: 4 g/dL (ref 3.5–5.0)
Alkaline Phosphatase: 99 U/L (ref 38–126)
Anion gap: 10 (ref 5–15)
BUN: 13 mg/dL (ref 8–23)
CO2: 24 mmol/L (ref 22–32)
Calcium: 8.8 mg/dL — ABNORMAL LOW (ref 8.9–10.3)
Chloride: 101 mmol/L (ref 98–111)
Creatinine, Ser: 0.94 mg/dL (ref 0.44–1.00)
GFR, Estimated: >60 mL/min (ref >60)
Glucose, Bld: 143 mg/dL — ABNORMAL HIGH (ref 70–99)
Potassium: 3.9 mmol/L (ref 3.5–5.1)
Sodium: 135 mmol/L (ref 135–145)
Total Bilirubin: 0.5 mg/dL (ref 0.3–1.2)
Total Protein: 7 g/dL (ref 6.5–8.1)

## 2022-08-22 LAB — IRON AND TIBC
Iron: 66 ug/dL (ref 28–170)
Saturation Ratios: 19 % (ref 10.4–31.8)
TIBC: 340 ug/dL (ref 250–450)
UIBC: 274 ug/dL

## 2022-08-22 LAB — VITAMIN D 25 HYDROXY (VIT D DEFICIENCY, FRACTURES): Vit D, 25-Hydroxy: 22.11 ng/mL — ABNORMAL LOW (ref 30–100)

## 2022-08-22 LAB — FERRITIN: Ferritin: 105 ng/mL (ref 11–307)

## 2022-08-22 LAB — VITAMIN B12: Vitamin B-12: 273 pg/mL (ref 180–914)

## 2022-08-25 LAB — METHYLMALONIC ACID, SERUM: Methylmalonic Acid, Quantitative: 275 nmol/L (ref 0–378)

## 2022-08-27 NOTE — Progress Notes (Unsigned)
VIRTUAL VISIT via [facility]   I connected with Heidi [name]  on 08/28/2022 at 8:50 AM by telephone and verified that I am speaking with the correct person using two identifiers.  Location: Patient: Home Provider: [facility]   I discussed the limitations, risks, security and privacy concerns of performing an evaluation and management service by telephone and the availability of in person appointments. I also discussed with the patient that there may be a patient responsible charge related to this service. The patient expressed understanding and agreed to proceed.  REASON FOR VISIT:  Follow-up for iron deficiency anemia   CURRENT THERAPY: Intermittent IV iron (last IV Venofer 06/03/2021)  INTERVAL HISTORY:  Heidi [name] is contacted today for follow-up of her iron deficiency anemia.  She was last evaluated by Heidi Abernethy PA-C via telemedicine visit on 01/03/2022.   Patient reports that she was hospitalized over the summer with severe pneumonia requiring 3-week ICU stay and ventilation [facility] in [place]).  Since that time, she is continue to have persistent fatigue and has started to use wheelchair due to ongoing gait instability, back problems, and foot drop.  Her breathing is not yet back to baseline after her pneumonia.  She has had intermittent black sticky bowel movements for the past month.  No rectal bleeding. She denies any pica, headaches, chest pain, lightheadedness, and syncope. She continues to take her vitamin D supplement (2000 units daily), but her B12 was stopped by another provider.   She has little to no energy and 100% appetite.  She endorses that she is maintaining a stable weight.  REVIEW OF SYSTEMS:   Review of Systems  Constitutional:  Positive for malaise/fatigue. Negative for chills, diaphoresis, fever and weight loss.  Respiratory:  Positive for shortness of breath. Negative for cough.    Cardiovascular:  Negative for chest pain and palpitations.  Gastrointestinal:  Negative for abdominal pain, blood in stool, melena, nausea and vomiting.  Musculoskeletal:  Positive for back pain.  Neurological:  Positive for weakness. Negative for dizziness and headaches.     PHYSICAL EXAM: (per limitations of virtual telephone visit)  The patient is alert and oriented x 3, exhibiting adequate mentation, good mood, and ability to speak in full sentences and execute sound judgement.  ASSESSMENT & PLAN:  1.  Microcytic iron deficient anemia secondary to chronic GI blood loss - Intermittent melena for the past year.  However, gastroenterology work-up thus far has not found specific source of bleeding. - EGD on 05/15/2020 which showed gastritis and one gastric polyp. - Colonoscopy on 08/14/2020 showed nonbleeding internal hemorrhoids, diverticulosis, and one polyp. - Patient is unable to have capsule endoscopy due to implanted pacemaker.   - Work-up for other causes of anemia was unremarkable: Normal SPEP/IFE/free light chains, normal B12/MMA, folate, copper; LDH normal; reticulocytes low at 2.2 likely hypoproliferation in the setting of iron deficiency; erythropoietin appropriately elevated at 119.4 - Unable to tolerate iron pill due to severe constipation - Most recent IV Venofer 06/03/2021. - No rectal bleeding.  Intermittent black/sticky bowel movements. - She continues to have severe chronic fatigue which is multifactorial - Most recent labs (08/22/2022): Hgb 12.3/MCV 92.6, ferritin 105, iron saturation 19 % - PLAN: No indication for IV iron.  Repeat labs and RTC in 6 months with OFFICE visit.   2.  History of right breast DCIS - Patient does have a history of stage 0 breast cancer, multicentric DCIS of the  right breast, s/p right mastectomy.  Pathology showed no residual disease, ER and PR status could not be assessed. Patient was on norethindrone for abnormal uterine function at the time of  her diagnosis.  She did not receive chemo or radiation; she was not placed on tamoxifen or anastrozole.  Her oncologist discharged her to PCP for annual breast exams and mammograms. - Most recent mammogram reportedly normal   - PLAN: Continue annual mammograms and breast exams with PCP.    3.  Vitamin D deficiency - Labs (12/20/2020) show decreased vitamin D at 20.74 - Prescription sent prescription sent for daily vitamin D supplementation 2000 units - Most recent vitamin D (08/22/2022): Low at 22.11 - PLAN: Recommend increasing vitamin D to 4000 units daily. - We will recheck vitamin D at follow-up visit in 6 months   4.  Vitamin B12 deficiency - Previously received vitamin B12 injections, but these were stopped a few months ago.  She has never tried vitamin B12 oral supplements. - Most recent B12 (08/22/2022): Marginal vitamin B12 at 273, methylmalonic acid normal - PLAN: Recommend starting vitamin B12 1000 mcg MWF.   We will recheck B12 and methylmalonic acid at follow-up visit in 6 months.    5.  Other history - PMH:  Other PMH includes type 2 diabetes mellitus, hypertension, tachybradycardia syndrome s/p pacemaker, Sjogren's disease, chronic pain syndrome (fibromyalgia and rheumatoid arthritis) on methadone, and other conditions noted elsewhere in medical record.  She is awaiting rotator cuff surgery of both shoulders.  She has chronic back pain and spinal stenosis.  She has sleep apnea, uses CPAP at home. - Social history: She lives at home with her husband.  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 She denies tobacco, alcohol, illicit drug use. - Family history:  Her family history is positive for paternal grandfather with colon cancer, paternal grandmother with ovarian cancer.  PLAN SUMMARY: >> Labs in 6 months (CBC/D, CMP, ferritin, iron/TIBC, B12, MMA, vitamin D) >> OFFICE visit in 6 months, 1 week after labs     I discussed the assessment and  treatment plan with the patient. The patient was provided an opportunity to ask questions and all were answered. The patient agreed with the plan and demonstrated an understanding of the instructions.   The patient was advised to call back or seek an in-person evaluation if the symptoms worsen or if the condition fails to improve as anticipated.  I provided 22 minutes of non-face-to-face time during this encounter.   Harriett Rush, PA-C 08/28/22 11:31 AM

## 2022-08-28 ENCOUNTER — Encounter: Payer: Self-pay | Admitting: Physician Assistant

## 2022-08-28 ENCOUNTER — Inpatient Hospital Stay: Payer: Medicare Other | Admitting: Physician Assistant

## 2022-08-28 ENCOUNTER — Other Ambulatory Visit: Payer: Medicare PPO | Admitting: Physician Assistant

## 2022-08-28 VITALS — Wt 242.0 lb

## 2022-08-28 DIAGNOSIS — E538 Deficiency of other specified B group vitamins: Secondary | ICD-10-CM | POA: Diagnosis not present

## 2022-08-28 DIAGNOSIS — E559 Vitamin D deficiency, unspecified: Secondary | ICD-10-CM | POA: Diagnosis not present

## 2022-08-28 DIAGNOSIS — D500 Iron deficiency anemia secondary to blood loss (chronic): Secondary | ICD-10-CM

## 2022-10-09 ENCOUNTER — Encounter (HOSPITAL_COMMUNITY): Payer: Self-pay | Admitting: Hematology

## 2022-11-27 ENCOUNTER — Encounter: Payer: Medicare Other | Admitting: Gastroenterology

## 2022-12-02 ENCOUNTER — Ambulatory Visit (INDEPENDENT_AMBULATORY_CARE_PROVIDER_SITE_OTHER): Payer: Medicare Other | Admitting: Gastroenterology

## 2022-12-02 ENCOUNTER — Encounter: Payer: Self-pay | Admitting: Gastroenterology

## 2022-12-02 VITALS — BP 124/81 | HR 82 | Temp 97.7°F | Ht 66.0 in | Wt 270.2 lb

## 2022-12-02 DIAGNOSIS — R1310 Dysphagia, unspecified: Secondary | ICD-10-CM | POA: Diagnosis not present

## 2022-12-02 NOTE — Progress Notes (Signed)
Gastroenterology Office Note     Primary Care Physician:  Janean Sark, NP  Referring Physician: Dr. Leona Carry, [facility], [facility]  Primary Gastroenterologist: Dr. Marletta Lor    Chief Complaint   Chief Complaint  Patient presents with   Gastroesophageal Reflux    Acid reflux      History of Present Illness   Heidi [name] is a 67 y.o. female presenting today  with a history of GERD, constipation, IDA in 2021 and followed by Hematology, referred by [facility] due to dysphagia.   She had several week hospitalization last summer due to pneumonia and required trach.  She has seen ENT (Dr. Maryfrances Bunnell at [facility]) for oropharyngeal dysphagia.  Had to have a bronchoscopy due to bronchial plugging. Voice will come and go. Will be raspy at times. Also noting esophageal dysphagia with solid foods. Mostly meats. Some pill dysphagia. Has to take one at a time. Sometimes odynophagia. Pantoprazole BID. Dexilant has helped the best. CT Feb 2022 focal mild sigmoid diverticulitis. Colonoscopy several months prior to this.   EGD Sept 2021 with reactive gastropathy, negative H.pylori, and colonoscopy Dec 2021 with tubular adenoma and surveillance due in 5 years   Developed afib while inpatient last summer. On Eliquis.   Past Medical History:  Diagnosis Date   Anemia    takes iron supplement   Anxiety    Arthritis    Asthma    prn neb.   Cancer    right breast cancer   CHF (congestive heart failure)    DDD (degenerative disc disease), cervical    Depression    Diabetes mellitus    IDDM   Diverticulitis    Diverticulitis large intestine    Diverticulosis    Environmental allergies    Fibromyalgia    GERD (gastroesophageal reflux disease)    Headache(784.0)    migraines   History of MRSA infection 08/2012   right breast   Hypertension    under control with med., has been on med. x 10-15 yr.   Overactive bladder    Pneumonia    PONV  (postoperative nausea and vomiting)    used scop patch 7/14-please use again per pt request   Presence of permanent cardiac pacemaker    Restless leg syndrome    Sjogren's disease    Sleep apnea    uses CPAP nightly    Past Surgical History:  Procedure Laterality Date   ANKLE ARTHROSCOPY Left    ANKLE FUSION Left    BACK SURGERY     BIOPSY  05/15/2020   Procedure: BIOPSY;  Surgeon: Lanelle Bal, DO;  [facility]: [facility];  Service: Endoscopy;;  gastric    BREAST RECONSTRUCTION  05/24/2012   Procedure: BREAST RECONSTRUCTION;  Surgeon: Louisa Second, MD;  Location: [facility];  Service: Plastics;  Laterality: Right;  NO FLEX HD   BREAST RECONSTRUCTION Right 01/17/2013   Procedure: RIGHT BREAST RECONSTRUCTION WITH PLACEMENT OF TISSUE EXPANDER ;  Surgeon: Louisa Second, MD;  Location: [facility];  Service: Plastics;  Laterality: Right;   BREAST REDUCTION SURGERY Left 02/28/2013   Procedure: LEFT BREAST REDUCTION  (BREAST);  Surgeon: Louisa Second, MD;  Location: [facility];  Service: Plastics;  Laterality: Left;   CESAREAN SECTION     CHOLECYSTECTOMY     COLONOSCOPY WITH PROPOFOL N/A 08/14/2020   non-bleeding internal hemorrhoids, multiple small mouth diverticula, 5 mm polyp in descending colon, fair prep. Tubular adenoma.  Surveillance in 5 years.    ESOPHAGOGASTRODUODENOSCOPY (EGD) WITH PROPOFOL N/A 05/15/2020   gastritis, reactive gastropathy. negative H.pylori. one gastric polyp. normal duodenum   INSERT / REPLACE / REMOVE PACEMAKER     MASTECTOMY Right 03/2012   NASAL SEPTUM SURGERY     POLYPECTOMY  05/15/2020   Procedure: POLYPECTOMY;  Surgeon: Lanelle Bal, DO;  [facility]: [facility];  Service: Endoscopy;;  antral   POLYPECTOMY  08/14/2020   Procedure: POLYPECTOMY;  Surgeon: Lanelle Bal, DO;  [facility]: [facility];  Service: Endoscopy;;   REMOVAL OF TISSUE EXPANDER AND PLACEMENT OF IMPLANT Right  05/10/2013   Procedure: RIGHT REMOVAL OF TISSUE EXPANDER ;  Surgeon: Louisa Second, MD;  Location: [facility];  Service: Plastics;  Laterality: Right;   REVERSE SHOULDER ARTHROPLASTY Right 03/07/2021   Procedure: REVERSE SHOULDER ARTHROPLASTY;  Surgeon: Jones Broom, MD;  Location: [facility];  Service: Orthopedics;  Laterality: Right;   SHOULDER ARTHROSCOPY Right    SHOULDER ARTHROSCOPY WITH SUBACROMIAL DECOMPRESSION Left 06/20/2021   Procedure: SHOULDER ARTHROSCOPY ROTATOR CUFF DEBRIDEMENT WITH SUBACROMIAL DECOMPRESSION, BICEPS TENOTOMY;  Surgeon: Jones Broom, MD;  Location: [facility];  Service: Orthopedics;  Laterality: Left;   TISSUE EXPANDER PLACEMENT  05/24/2012   Procedure: TISSUE EXPANDER;  Surgeon: Louisa Second, MD;  Location: [facility];  Service: Plastics;  Laterality: Right;    Current Outpatient Medications  Medication Sig Dispense Refill   ACCU-CHEK GUIDE test strip See admin instructions.     albuterol (PROVENTIL) (2.5 MG/3ML) 0.083% nebulizer solution Inhale into the lungs.     Albuterol Sulfate (PROAIR RESPICLICK) 108 (90 Base) MCG/ACT AEPB Inhale 1 ampule into the lungs in the morning and at bedtime.     allopurinol (ZYLOPRIM) 100 MG tablet Take 200 mg by mouth daily.     allopurinol (ZYLOPRIM) 100 MG tablet Take 1 tablet by mouth in the morning and at bedtime.     ALPRAZolam (XANAX) 0.5 MG tablet Take 1 tablet by mouth in the morning and at bedtime.     apixaban (ELIQUIS) 5 MG TABS tablet Take 5 mg by mouth 2 (two) times daily.     B-D 3CC LUER-LOK SYR 25GX1" 25G X 1" 3 ML MISC USE AS DIRECTED MONTHLY     BD PEN NEEDLE MICRO U/F 32G X 6 MM MISC Inject into the skin daily.     benztropine (COGENTIN) 0.5 MG tablet Take 0.5 mg by mouth 2 (two) times daily.     BREZTRI AEROSPHERE 160-9-4.8 MCG/ACT AERO Inhale into the lungs.     Buprenorphine HCl-Naloxone HCl 2-0.5 MG FILM Place 1 Film under the tongue at bedtime.      Camphor-Menthol-Methyl Sal (SALONPAS) 3.08-23-08 % PTCH Apply 1 patch topically daily as needed (pain).     Cholecalciferol (VITAMIN D) 50 MCG (2000 UT) CAPS Take 1 capsule by mouth once daily 90 capsule 0   clotrimazole (LOTRIMIN) 1 % cream Apply topically 2 (two) times daily.     cyanocobalamin (,VITAMIN B-12,) 1000 MCG/ML injection Inject 1,000 mcg into the muscle every 30 (thirty) days.     cyclobenzaprine (FLEXERIL) 5 MG tablet Take 5 mg by mouth 3 (three) times daily as needed for pain.     cycloSPORINE (RESTASIS) 0.05 % ophthalmic emulsion Place 1 drop into both eyes 2 (two) times daily.     DULoxetine (CYMBALTA) 60 MG capsule Take 60 mg by mouth at bedtime.     folic acid (FOLVITE) 1 MG tablet Take 1 mg  by mouth daily.     metFORMIN (GLUCOPHAGE) 500 MG tablet Take 500 mg by mouth 2 (two) times daily with a meal.     modafinil (PROVIGIL) 200 MG tablet Take 200 mg by mouth daily.     MOUNJARO 2.5 MG/0.5ML Pen SMARTSIG:2.5 Milligram(s) SUB-Q Once a Week     nystatin (MYCOSTATIN) 100000 UNIT/ML suspension 5 mL orally swish, gargle and spit4 times a day for 7 days     NYSTATIN powder Apply 2-3 application topically daily as needed (Stomach).     oxybutynin (DITROPAN) 5 MG tablet Take 1 tablet by mouth in the morning, at noon, and at bedtime.     pantoprazole (PROTONIX) 40 MG tablet Take 40 mg by mouth daily.     pregabalin (LYRICA) 100 MG capsule Take 100 mg by mouth 2 (two) times daily.     propranolol (INDERAL) 10 MG tablet Take 10 mg by mouth 2 (two) times daily.     colchicine 0.6 MG tablet Take 0.6 mg by mouth daily as needed (gout flare).     hydroxychloroquine (PLAQUENIL) 200 MG tablet Take 400 mg by mouth daily.     lamoTRIgine (LAMICTAL) 100 MG tablet Take 150 mg by mouth 2 (two) times daily.     prednisoLONE acetate (PRED FORTE) 1 % ophthalmic suspension Place 1 drop into both eyes daily as needed (dry eyes).     No current facility-administered medications for this visit.     Allergies as of 12/02/2022 - Review Complete 12/02/2022  Allergen Reaction Noted   Soap Other (See Comments) 02/22/2013   Allevyn adhesive [wound dressings]  12/05/2020   Aripiprazole  12/05/2020   Cipro [ciprofloxacin hcl] Diarrhea and Nausea And Vomiting 01/02/2021   Ketorolac tromethamine  12/05/2020   Latuda [lurasidone hcl]  02/26/2021   Lurasidone  09/05/2021   Mixed grasses Hives 12/05/2020   Pollen extract  12/05/2020   Prednisone  12/05/2020   Requip [ropinirole]  12/05/2020   Risperidone  12/05/2020   Sulfamethoxazole-trimethoprim Hives and Itching 12/05/2020   Vraylar [cariprazine]  08/03/2020   Latex Itching 02/22/2013   Sulfa antibiotics Nausea Only and Rash 05/20/2012   Tape Rash 09/05/2021    Family History  Problem Relation Age of Onset   Colon cancer Paternal Grandmother    Colon cancer Paternal Grandfather     Social History   Socioeconomic History   Marital status: Married    Spouse name: Not on file   Number of children: Not on file   Years of education: Not on file   Highest education level: Not on file  Occupational History   Not on file  Tobacco Use   Smoking status: Never   Smokeless tobacco: Never  Vaping Use   Vaping Use: Never used  Substance and Sexual Activity   Alcohol use: No   Drug use: No   Sexual activity: Not Currently  Other Topics Concern   Not on file  Social History Narrative   Not on file   Social Determinants of Health   Financial Resource Strain: Not on file  Food Insecurity: No Food Insecurity (12/20/2020)   Hunger Vital Sign    Worried About Running Out of Food in the Last Year: Never true    Ran Out of Food in the Last Year: Never true  Transportation Needs: No Transportation Needs (12/20/2020)   PRAPARE - Administrator, Civil Service (Medical): No    Lack of Transportation (Non-Medical): No  Physical Activity: Inactive (12/20/2020)  Exercise Vital Sign    Days of Exercise per Week: 0 days     Minutes of Exercise per Session: 0 min  Stress: No Stress Concern Present (12/20/2020)   [employer] of Occupational Health - Occupational Stress Questionnaire    Feeling of Stress : Only a little  Social Connections: Not on file  Intimate Partner Violence: Not At Risk (12/20/2020)   Humiliation, Afraid, Rape, and Kick questionnaire    Fear of Current or Ex-Partner: No    Emotionally Abused: No    Physically Abused: No    Sexually Abused: No     Review of Systems   Gen: Denies any fever, chills, fatigue, weight loss, lack of appetite.  CV: Denies chest pain, heart palpitations, peripheral edema, syncope.  Resp: Denies shortness of breath at rest or with exertion. Denies wheezing or cough.  GI: see HPI GU : Denies urinary burning, urinary frequency, urinary hesitancy MS: Denies joint pain, muscle weakness, cramps, or limitation of movement.  Derm: Denies rash, itching, dry skin Psych: Denies depression, anxiety, memory loss, and confusion Heme: Denies bruising, bleeding, and enlarged lymph nodes.   Physical Exam   BP 124/81 (BP Location: Left Arm, Patient Position: Sitting, Cuff Size: Large)   Pulse 82   Temp 97.7 F (36.5 C) (Temporal)   Ht 5\' 6"  (1.676 m)   Wt 270 lb 3.2 oz (122.6 kg)   LMP 05/20/2012   SpO2 98%   BMI 43.61 kg/m  General:   Alert and oriented. Pleasant and cooperative. Well-nourished and well-developed.  Head:  Normocephalic and atraumatic. Eyes:  Without icterus Cardiac: S1 S2 without murmurs Lungs: clear bilaterally Abdomen:  +BS, soft, non-tender and non-distended. No HSM noted. No guarding or rebound. No masses appreciated.  Rectal:  Deferred  Msk:  Symmetrical without gross deformities. Uses cane.  Extremities:  Without edema. Neurologic:  Alert and  oriented x4;  grossly normal neurologically. Skin:  Intact without significant lesions or rashes. Psych:  Alert and cooperative. Normal mood and affect.   Assessment   Heidi [name] is a  67 y.o. female presenting today  with a history of GERD, constipation, IDA in 2021 and followed by Hematology, referred by [facility] due to dysphagia.   Dysphagia: she has known oropharyngeal dysphagia and evaluated by ENT last year at [facility]. Prolonged hospitalization last summer for pneumonia and required trach. She still does appear to have an esophageal component as well. As last EGD was in Sept 2021, we can pursue EGD with dilation as appropriate.  May benefit from BPE if persistent issues.   IDA: previously evaluated in 2021. Continues to follow with Hematology. Hgb 12 in Jan 2024, ferritin 105.   GERD: on pantoprazole BID. She has had best results with Dexilant, but this is not covered any longer. Not ideally controlled. Await EGD. She is agreeable to discuss other options have EGD. (?Voquezna?)   She will need to hold Eliquis X 48 hours prior to procedure.  Stop Mounjaro one week prior, no metformin day of procedure.      PLAN    Proceed with upper endoscopy/dilation by Dr. Marletta Lor in near future: the risks, benefits, and alternatives have been discussed with the patient in detail. The patient states understanding and desires to proceed.  Continue pantoprazole BID Consider changing PPI therapy BPE if persistent esophageal dysphagia Follow-up after procedures.    Gelene Mink, PhD, ANP-BC [facility]

## 2022-12-02 NOTE — Patient Instructions (Signed)
We are arranging an upper endoscopy with dilation by Dr. Marletta Lor in the near future.  You will stop Mounjaro 1 week prior, hold Eliquis for 48 hours prior, and no metformin the day of the procedure!  I will see you in follow-up thereafter!  I enjoyed seeing you again today! At our first visit, I mentioned how I value our relationship and want to provide genuine, compassionate, and quality care. You may receive a survey regarding your visit with me, and I welcome your feedback! Thanks so much for taking the time to complete this. I look forward to seeing you again.   Gelene Mink, PhD, ANP-BC [facility]

## 2022-12-03 ENCOUNTER — Encounter: Payer: Self-pay | Admitting: *Deleted

## 2022-12-03 ENCOUNTER — Telehealth: Payer: Self-pay | Admitting: *Deleted

## 2022-12-03 NOTE — Telephone Encounter (Signed)
Pt has been scheduled for 01/09/23. Instructions mailed.

## 2022-12-03 NOTE — Telephone Encounter (Signed)
LMOVM to call back to schedule EGD/ED with Dr. Marletta Lor, ASA 3, stop monjaro 1 week prior (take son Monday), stop eliquis 48 hrs prior, no metformin day of procedure

## 2023-01-05 NOTE — Patient Instructions (Addendum)
Heidi [name]  01/05/2023     @PREFPERIOPPHARMACY @   Your procedure is scheduled on  01/09/2023.   Report to [facility] at  0830 A.M.   Call this number if you have problems the morning of surgery:  [phone]  If you experience any cold or flu symptoms such as cough, fever, chills, shortness of breath, etc. between now and your scheduled surgery, please notify us at the above number.   Remember:  Follow the diet instructions given to you by the office.      Your last dose of mounjaro should have been on 01/01/2023 and your last dose of eliquis should be on 01/06/2023.    Use your nebulizer and your inhaler before you come and bring your rescue inhaler with you.    Take these medicines the morning of surgery with A SIP OF WATER :        Xanax(if needed), cogentin, flexeril, cymbalta, hydroxyzine, lamictal, lyrica, propranolol.    Do not wear jewelry, make-up or nail polish.  Do not wear lotions, powders, or perfumes, or deodorant.  Do not shave 48 hours prior to surgery.  Men may shave face and neck.  Do not bring valuables to the hospital.  [facility] is not responsible for any belongings or valuables.  Contacts, dentures or bridgework may not be worn into surgery.  Leave your suitcase in the car.  After surgery it may be brought to your room.  For patients admitted to the hospital, discharge time will be determined by your treatment team.  Patients discharged the day of surgery will not be allowed to drive home.    Special instructions:  DO NOT smoke tobacco or vape for 24 hours before your procedure.  Please read over the following fact sheets that you were given. Care and Recovery After Surgery      Upper Endoscopy, Adult, Care After After the procedure, it is common to have a sore throat. It is also common to have: Mild stomach pain or discomfort. Bloating. Nausea. Follow these instructions at home: The instructions below may help you care  for yourself at home. Your health care provider may give you more instructions. If you have questions, ask your health care provider. If you were given a sedative during the procedure, it can affect you for several hours. Do not drive or operate machinery until your health care provider says that it is safe. If you will be going home right after the procedure, plan to have a responsible adult: Take you home from the hospital or clinic. You will not be allowed to drive. Care for you for the time you are told. Follow instructions from your health care provider about what you may eat and drink. Return to your normal activities as told by your health care provider. Ask your health care provider what activities are safe for you. Take over-the-counter and prescription medicines only as told by your health care provider. Contact a health care provider if you: Have a sore throat that lasts longer than one day. Have trouble swallowing. Have a fever. Get help right away if you: Vomit blood or your vomit looks like coffee grounds. Have bloody, black, or tarry stools. Have a very bad sore throat or you cannot swallow. Have difficulty breathing or very bad pain in your chest or abdomen. These symptoms may be an emergency. Get help right away. Call 911. Do not wait to see if the symptoms will  go away. Do not drive yourself to the hospital. Summary After the procedure, it is common to have a sore throat, mild stomach discomfort, bloating, and nausea. If you were given a sedative during the procedure, it can affect you for several hours. Do not drive until your health care provider says that it is safe. Follow instructions from your health care provider about what you may eat and drink. Return to your normal activities as told by your health care provider. This information is not intended to replace advice given to you by your health care provider. Make sure you discuss any questions you have with your  health care provider. Document Revised: 11/13/2021 Document Reviewed: 11/13/2021 Elsevier Patient Education  2023 [employer] Esophageal Dilatation Esophageal dilatation, also called esophageal dilation, is a procedure to widen or open a blocked or narrowed part of the esophagus. The esophagus is the part of the body that moves food and liquid from the mouth to the stomach. You may need this procedure if: You have a buildup of scar tissue in your esophagus that makes it difficult, painful, or impossible to swallow. This can be caused by gastroesophageal reflux disease (GERD). You have cancer of the esophagus. There is a problem with how food moves through your esophagus. In some cases, you may need this procedure repeated at a later time to dilate the esophagus gradually. Tell a health care provider about: Any allergies you have. All medicines you are taking, including vitamins, herbs, eye drops, creams, and over-the-counter medicines. Any problems you or family members have had with anesthetic medicines. Any blood disorders you have. Any surgeries you have had. Any medical conditions you have. Any antibiotic medicines you are required to take before dental procedures. Whether you are pregnant or may be pregnant. What are the risks? Generally, this is a safe procedure. However, problems may occur, including: Bleeding due to a tear in the lining of the esophagus. A hole, or perforation, in the esophagus. What happens before the procedure? Ask your health care provider about: Changing or stopping your regular medicines. This is especially important if you are taking diabetes medicines or blood thinners. Taking medicines such as aspirin and ibuprofen. These medicines can thin your blood. Do not take these medicines unless your health care provider tells you to take them. Taking over-the-counter medicines, vitamins, herbs, and supplements. Follow instructions from your health care provider  about eating or drinking restrictions. Plan to have a responsible adult take you home from the hospital or clinic. Plan to have a responsible adult care for you for the time you are told after you leave the hospital or clinic. This is important. What happens during the procedure? You may be given a medicine to help you relax (sedative). A numbing medicine may be sprayed into the back of your throat, or you may gargle the medicine. Your health care provider may perform the dilatation using various surgical instruments, such as: Simple dilators. This instrument is carefully placed in the esophagus to stretch it. Guided wire bougies. This involves using an endoscope to insert a wire into the esophagus. A dilator is passed over this wire to enlarge the esophagus. Then the wire is removed. Balloon dilators. An endoscope with a small balloon is inserted into the esophagus. The balloon is inflated to stretch the esophagus and open it up. The procedure may vary among health care providers and hospitals. What can I expect after the procedure? Your blood pressure, heart rate, breathing rate, and blood oxygen level  will be monitored until you leave the hospital or clinic. Your throat may feel slightly sore and numb. This will get better over time. You will not be allowed to eat or drink until your throat is no longer numb. When you are able to drink, urinate, and sit on the edge of the bed without nausea or dizziness, you may be able to return home. Follow these instructions at home: Take over-the-counter and prescription medicines only as told by your health care provider. If you were given a sedative during the procedure, it can affect you for several hours. Do not drive or operate machinery until your health care provider says that it is safe. Plan to have a responsible adult care for you for the time you are told. This is important. Follow instructions from your health care provider about any eating or  drinking restrictions. Do not use any products that contain nicotine or tobacco, such as cigarettes, e-cigarettes, and chewing tobacco. If you need help quitting, ask your health care provider. Keep all follow-up visits. This is important. Contact a health care provider if: You have a fever. You have pain that is not relieved by medicine. Get help right away if: You have chest pain. You have trouble breathing. You have trouble swallowing. You vomit blood. You have black, tarry, or bloody stools. These symptoms may represent a serious problem that is an emergency. Do not wait to see if the symptoms will go away. Get medical help right away. Call your local emergency services (911 in the [place] Do not drive yourself to the hospital. Summary Esophageal dilatation, also called esophageal dilation, is a procedure to widen or open a blocked or narrowed part of the esophagus. Plan to have a responsible adult take you home from the hospital or clinic. For this procedure, a numbing medicine may be sprayed into the back of your throat, or you may gargle the medicine. Do not drive or operate machinery until your health care provider says that it is safe. This information is not intended to replace advice given to you by your health care provider. Make sure you discuss any questions you have with your health care provider. Document Revised: 12/21/2019 Document Reviewed: 12/21/2019 Elsevier Patient Education  2023 Elsevier Inc. Monitored Anesthesia Care, Care After The following information offers guidance on how to care for yourself after your procedure. Your health care provider may also give you more specific instructions. If you have problems or questions, contact your health care provider. What can I expect after the procedure? After the procedure, it is common to have: Tiredness. Little or no memory about what happened during or after the procedure. Impaired judgment when it comes to making  decisions. Nausea or vomiting. Some trouble with balance. Follow these instructions at home: For the time period you were told by your health care provider:  Rest. Do not participate in activities where you could fall or become injured. Do not drive or use machinery. Do not drink alcohol. Do not take sleeping pills or medicines that cause drowsiness. Do not make important decisions or sign legal documents. Do not take care of children on your own. Medicines Take over-the-counter and prescription medicines only as told by your health care provider. If you were prescribed antibiotics, take them as told by your health care provider. Do not stop using the antibiotic even if you start to feel better. Eating and drinking Follow instructions from your health care provider about what you may eat and drink. Drink enough  fluid to keep your urine pale yellow. If you vomit: Drink clear fluids slowly and in small amounts as you are able. Clear fluids include water, ice chips, low-calorie sports drinks, and fruit juice that has water added to it (diluted fruit juice). Eat light and bland foods in small amounts as you are able. These foods include bananas, applesauce, rice, lean meats, toast, and crackers. General instructions  Have a responsible adult stay with you for the time you are told. It is important to have someone help care for you until you are awake and alert. If you have sleep apnea, surgery and some medicines can increase your risk for breathing problems. Follow instructions from your health care provider about wearing your sleep device: When you are sleeping. This includes during daytime naps. While taking prescription pain medicines, sleeping medicines, or medicines that make you drowsy. Do not use any products that contain nicotine or tobacco. These products include cigarettes, chewing tobacco, and vaping devices, such as e-cigarettes. If you need help quitting, ask your health care  provider. Contact a health care provider if: You feel nauseous or vomit every time you eat or drink. You feel light-headed. You are still sleepy or having trouble with balance after 24 hours. You get a rash. You have a fever. You have redness or swelling around the IV site. Get help right away if: You have trouble breathing. You have new confusion after you get home. These symptoms may be an emergency. Get help right away. Call 911. Do not wait to see if the symptoms will go away. Do not drive yourself to the hospital. This information is not intended to replace advice given to you by your health care provider. Make sure you discuss any questions you have with your health care provider. Document Revised: 12/30/2021 Document Reviewed: 12/30/2021 Elsevier Patient Education  2023 [employer].

## 2023-01-06 ENCOUNTER — Encounter (HOSPITAL_COMMUNITY)
Admission: RE | Admit: 2023-01-06 | Discharge: 2023-01-06 | Disposition: A | Discharge status: Home / Self Care | Payer: Medicare Other | Source: Ambulatory Visit | Attending: Internal Medicine | Admitting: Internal Medicine

## 2023-01-06 ENCOUNTER — Encounter (HOSPITAL_COMMUNITY): Payer: Self-pay

## 2023-01-06 VITALS — BP 124/49 | HR 57 | Temp 98.4°F | Resp 18 | Ht 66.0 in | Wt 270.3 lb

## 2023-01-06 DIAGNOSIS — E785 Hyperlipidemia, unspecified: Secondary | ICD-10-CM | POA: Insufficient documentation

## 2023-01-06 DIAGNOSIS — Z01818 Encounter for other preprocedural examination: Secondary | ICD-10-CM | POA: Diagnosis not present

## 2023-01-06 DIAGNOSIS — E1169 Type 2 diabetes mellitus with other specified complication: Secondary | ICD-10-CM | POA: Diagnosis not present

## 2023-01-06 DIAGNOSIS — D500 Iron deficiency anemia secondary to blood loss (chronic): Secondary | ICD-10-CM

## 2023-01-06 HISTORY — DX: Unspecified atrial fibrillation: I48.91

## 2023-01-06 HISTORY — DX: Unspecified atrial flutter: I48.92

## 2023-01-06 HISTORY — DX: Cardiac arrhythmia, unspecified: I49.9

## 2023-01-06 HISTORY — DX: Chronic kidney disease, unspecified: N18.9

## 2023-01-06 LAB — CBC WITH DIFFERENTIAL/PLATELET
Abs Immature Granulocytes: 0.04 10*3/uL (ref 0.00–0.07)
Basophils Absolute: 0 10*3/uL (ref 0.0–0.1)
Basophils Relative: 1 %
Eosinophils Absolute: 0.3 10*3/uL (ref 0.0–0.5)
Eosinophils Relative: 4 %
HCT: 36 % (ref 36.0–46.0)
Hemoglobin: 12.1 g/dL (ref 12.0–15.0)
Immature Granulocytes: 1 %
Lymphocytes Relative: 18 %
Lymphs Abs: 1.1 10*3/uL (ref 0.7–4.0)
MCH: 30.3 pg (ref 26.0–34.0)
MCHC: 33.6 g/dL (ref 30.0–36.0)
MCV: 90 fL (ref 80.0–100.0)
Monocytes Absolute: 0.4 10*3/uL (ref 0.1–1.0)
Monocytes Relative: 7 %
Neutro Abs: 4.3 10*3/uL (ref 1.7–7.7)
Neutrophils Relative %: 69 %
Platelets: 191 10*3/uL (ref 150–400)
RBC: 4 MIL/uL (ref 3.87–5.11)
RDW: 14.5 % (ref 11.5–15.5)
WBC: 6.1 10*3/uL (ref 4.0–10.5)
nRBC: 0 % (ref 0.0–0.2)

## 2023-01-06 LAB — BASIC METABOLIC PANEL
Anion gap: 10 (ref 5–15)
BUN: 19 mg/dL (ref 8–23)
CO2: 26 mmol/L (ref 22–32)
Calcium: 9.4 mg/dL (ref 8.9–10.3)
Chloride: 100 mmol/L (ref 98–111)
Creatinine, Ser: 1.07 mg/dL — ABNORMAL HIGH (ref 0.44–1.00)
GFR, Estimated: 58 mL/min — ABNORMAL LOW (ref >60)
Glucose, Bld: 125 mg/dL — ABNORMAL HIGH (ref 70–99)
Potassium: 3.4 mmol/L — ABNORMAL LOW (ref 3.5–5.1)
Sodium: 136 mmol/L (ref 135–145)

## 2023-01-09 ENCOUNTER — Ambulatory Visit (HOSPITAL_COMMUNITY): Payer: Medicare Other | Admitting: Anesthesiology

## 2023-01-09 ENCOUNTER — Encounter (HOSPITAL_COMMUNITY): Payer: Self-pay

## 2023-01-09 ENCOUNTER — Other Ambulatory Visit: Payer: Self-pay

## 2023-01-09 ENCOUNTER — Ambulatory Visit (HOSPITAL_COMMUNITY)
Admission: RE | Admit: 2023-01-09 | Discharge: 2023-01-09 | Disposition: A | Discharge status: Home / Self Care | Payer: Medicare Other | Attending: Internal Medicine | Admitting: Internal Medicine

## 2023-01-09 ENCOUNTER — Encounter (HOSPITAL_COMMUNITY)
Admission: RE | Disposition: A | Discharge status: Home / Self Care | Payer: Self-pay | Source: Home / Self Care | Attending: Internal Medicine

## 2023-01-09 DIAGNOSIS — D759 Disease of blood and blood-forming organs, unspecified: Secondary | ICD-10-CM | POA: Diagnosis not present

## 2023-01-09 DIAGNOSIS — M1990 Unspecified osteoarthritis, unspecified site: Secondary | ICD-10-CM | POA: Insufficient documentation

## 2023-01-09 DIAGNOSIS — K317 Polyp of stomach and duodenum: Secondary | ICD-10-CM | POA: Diagnosis not present

## 2023-01-09 DIAGNOSIS — N183 Chronic kidney disease, stage 3 unspecified: Secondary | ICD-10-CM | POA: Diagnosis not present

## 2023-01-09 DIAGNOSIS — K2970 Gastritis, unspecified, without bleeding: Secondary | ICD-10-CM

## 2023-01-09 DIAGNOSIS — I509 Heart failure, unspecified: Secondary | ICD-10-CM | POA: Diagnosis not present

## 2023-01-09 DIAGNOSIS — E1122 Type 2 diabetes mellitus with diabetic chronic kidney disease: Secondary | ICD-10-CM | POA: Diagnosis not present

## 2023-01-09 DIAGNOSIS — K2270 Barrett's esophagus without dysplasia: Secondary | ICD-10-CM | POA: Insufficient documentation

## 2023-01-09 DIAGNOSIS — I110 Hypertensive heart disease with heart failure: Secondary | ICD-10-CM

## 2023-01-09 DIAGNOSIS — R1310 Dysphagia, unspecified: Secondary | ICD-10-CM | POA: Diagnosis present

## 2023-01-09 DIAGNOSIS — D649 Anemia, unspecified: Secondary | ICD-10-CM | POA: Insufficient documentation

## 2023-01-09 DIAGNOSIS — M797 Fibromyalgia: Secondary | ICD-10-CM | POA: Insufficient documentation

## 2023-01-09 DIAGNOSIS — I130 Hypertensive heart and chronic kidney disease with heart failure and stage 1 through stage 4 chronic kidney disease, or unspecified chronic kidney disease: Secondary | ICD-10-CM | POA: Diagnosis not present

## 2023-01-09 DIAGNOSIS — I4891 Unspecified atrial fibrillation: Secondary | ICD-10-CM | POA: Diagnosis not present

## 2023-01-09 DIAGNOSIS — K219 Gastro-esophageal reflux disease without esophagitis: Secondary | ICD-10-CM | POA: Insufficient documentation

## 2023-01-09 DIAGNOSIS — Z6841 Body Mass Index (BMI) 40.0 and over, adult: Secondary | ICD-10-CM | POA: Diagnosis not present

## 2023-01-09 DIAGNOSIS — K3189 Other diseases of stomach and duodenum: Secondary | ICD-10-CM

## 2023-01-09 DIAGNOSIS — M3500 Sicca syndrome, unspecified: Secondary | ICD-10-CM | POA: Insufficient documentation

## 2023-01-09 DIAGNOSIS — Z7984 Long term (current) use of oral hypoglycemic drugs: Secondary | ICD-10-CM | POA: Insufficient documentation

## 2023-01-09 DIAGNOSIS — F418 Other specified anxiety disorders: Secondary | ICD-10-CM | POA: Insufficient documentation

## 2023-01-09 DIAGNOSIS — R12 Heartburn: Secondary | ICD-10-CM | POA: Diagnosis not present

## 2023-01-09 HISTORY — PX: BIOPSY: SHX5522

## 2023-01-09 HISTORY — PX: ESOPHAGOGASTRODUODENOSCOPY (EGD) WITH PROPOFOL: SHX5813

## 2023-01-09 LAB — GLUCOSE, CAPILLARY: Glucose-Capillary: 145 mg/dL — ABNORMAL HIGH (ref 70–99)

## 2023-01-09 SURGERY — ESOPHAGOGASTRODUODENOSCOPY (EGD) WITH PROPOFOL
Anesthesia: General

## 2023-01-09 MED ORDER — PROPOFOL 10 MG/ML IV BOLUS
INTRAVENOUS | Status: DC | PRN
  Administered 2023-01-09 (×2): 50 mg via INTRAVENOUS
  Administered 2023-01-09: 100 mg via INTRAVENOUS

## 2023-01-09 MED ORDER — LACTATED RINGERS IV SOLN: INTRAVENOUS | Status: DC

## 2023-01-09 MED ORDER — LIDOCAINE HCL (CARDIAC) PF 100 MG/5ML IV SOSY
PREFILLED_SYRINGE | INTRAVENOUS | Status: DC | PRN
  Administered 2023-01-09: 50 mg via INTRAVENOUS

## 2023-01-09 NOTE — Discharge Instructions (Addendum)
EGD Discharge instructions Please read the instructions outlined below and refer to this sheet in the next few weeks. These discharge instructions provide you with general information on caring for yourself after you leave the hospital. Your doctor may also give you specific instructions. While your treatment has been planned according to the most current medical practices available, unavoidable complications occasionally occur. If you have any problems or questions after discharge, please call your doctor. ACTIVITY You may resume your regular activity but move at a slower pace for the next 24 hours.  Take frequent rest periods for the next 24 hours.  Walking will help expel (get rid of) the air and reduce the bloated feeling in your abdomen.  No driving for 24 hours (because of the anesthesia (medicine) used during the test).  You may shower.  Do not sign any important legal documents or operate any machinery for 24 hours (because of the anesthesia used during the test).  NUTRITION Drink plenty of fluids.  You may resume your normal diet.  Begin with a light meal and progress to your normal diet.  Avoid alcoholic beverages for 24 hours or as instructed by your caregiver.  MEDICATIONS You may resume your normal medications unless your caregiver tells you otherwise.  WHAT YOU CAN EXPECT TODAY You may experience abdominal discomfort such as a feeling of fullness or "gas" pains.  FOLLOW-UP Your doctor will discuss the results of your test with you.  SEEK IMMEDIATE MEDICAL ATTENTION IF ANY OF THE FOLLOWING OCCUR: Excessive nausea (feeling sick to your stomach) and/or vomiting.  Severe abdominal pain and distention (swelling).  Trouble swallowing.  Temperature over 101 F (37.8 C).  Rectal bleeding or vomiting of blood.    Your upper endoscopy revealed evidence of short segment Barrett's esophagus.  I took samples of this.  Your esophagus was wide open, I did not need to dilate  today.  Stomach showed mild inflammation which I biopsied to rule infection a bacteria called H. pylori.  You did have a larger polyp in the end portion of your stomach seen previously.  I biopsied this today.  Small bowel appeared normal.  Await pathology results, my office will contact you.  Continue on pantoprazole twice daily.  We may try to get you back on Dexilant.  May need to consider barium pill esophagram to evaluate for esophageal dysmotility.  Follow-up in GI office in 6 weeks.  I hope you have a great rest of your week!  Hennie Duos. Marletta Lor, D.O. Gastroenterology and Hepatology [facility]

## 2023-01-09 NOTE — Anesthesia Preprocedure Evaluation (Signed)
Anesthesia Evaluation  Patient identified by MRN, date of birth, ID band Patient awake    Reviewed: Allergy & Precautions, H&P , NPO status , Patient's Chart, lab work & pertinent test results  History of Anesthesia Complications (+) PONV and history of anesthetic complications  Airway Mallampati: III  TM Distance: >3 FB Neck ROM: Full   Comment: DDD (degenerative disc disease), cervical Dental  (+) Dental Advisory Given   Pulmonary asthma , sleep apnea and Continuous Positive Airway Pressure Ventilation , pneumonia, resolved   Pulmonary exam normal breath sounds clear to auscultation       Cardiovascular hypertension, Pt. on medications +CHF  + dysrhythmias Atrial Fibrillation + pacemaker  Rhythm:Regular Rate:Normal  06-Jan-2023 09:03:55 [facility] ROUTINE RECORD 1956/01/23 (66 yr) Female Caucasian Vent. rate 65 BPM PR interval 206 ms QRS duration 102 ms QT/QTcB 410/426 ms P-R-T axes  -21 4 Atrial-paced rhythm Septal infarct , age undetermined Abnormal ECG When compared with ECG of 14-Oct-2020 04:10, No significant change was found Confirmed by York Pellant [phone]) on 01/07/2023 8:28:53 PM   Neuro/Psych  Headaches PSYCHIATRIC DISORDERS Anxiety Depression     Neuromuscular disease    GI/Hepatic Neg liver ROS,GERD  Medicated,,  Endo/Other  diabetes, Well Controlled, Type 2, Oral Hypoglycemic Agents  Morbid obesity  Renal/GU Renal InsufficiencyRenal disease  negative genitourinary   Musculoskeletal  (+) Arthritis ,  Fibromyalgia -  Abdominal   Peds negative pediatric ROS (+)  Hematology  (+) Blood dyscrasia, anemia   Anesthesia Other Findings Sjogren's disease (HCC)  Reproductive/Obstetrics negative OB ROS                             Anesthesia Physical Anesthesia Plan  ASA: 3  Anesthesia Plan: General   Post-op Pain Management: Minimal or no pain  anticipated   Induction: Intravenous  PONV Risk Score and Plan: 1 and Propofol infusion  Airway Management Planned: Nasal Cannula and Natural Airway  Additional Equipment:   Intra-op Plan:   Post-operative Plan:   Informed Consent: I have reviewed the patients History and Physical, chart, labs and discussed the procedure including the risks, benefits and alternatives for the proposed anesthesia with the patient or authorized representative who has indicated his/her understanding and acceptance.     Dental advisory given  Plan Discussed with: CRNA and Surgeon  Anesthesia Plan Comments:        Anesthesia Quick Evaluation

## 2023-01-09 NOTE — Op Note (Addendum)
[facility] Patient Name: Heidi [name] Procedure Date: 01/09/2023 7:02 AM MRN: [id number] Date of Birth: 1956-08-12 Attending MD: Hennie Duos. Marletta Lor , [place], [id number] CSN: [id number] Age: 67 Admit Type: Outpatient Procedure:                Upper GI endoscopy Indications:              Dysphagia, Heartburn Providers:                Hennie Duos. Marletta Lor, DO, Nena Polio, RN, Lennice Sites                            Technician, Technician Referring MD:              Medicines:                See the Anesthesia note for documentation of the                            administered medications Complications:            No immediate complications. Estimated Blood Loss:     Estimated blood loss was minimal. Procedure:                Pre-Anesthesia Assessment:                           - The anesthesia plan was to use monitored                            anesthesia care (MAC).                           After obtaining informed consent, the endoscope was                            passed under direct vision. Throughout the                            procedure, the patient's blood pressure, pulse, and                            oxygen saturations were monitored continuously. The                            [id number]-[id number] ([id number]) scope was introduced through the                            mouth, and advanced to the second part of duodenum.                            The upper GI endoscopy was accomplished without                            difficulty. The patient tolerated the procedure                            well. Scope In: 7:33:16  AM Scope Out: 7:39:44 AM Total Procedure Duration: 0 hours 6 minutes 28 seconds  Findings:      There were esophageal mucosal changes classified as Barrett's stage       C0-M1 per Prague criteria present at the gastroesophageal junction. The       maximum longitudinal extent of these mucosal changes was 1 cm in length.       Mucosa was biopsied with a cold forceps for  histology. One specimen       bottle was sent to pathology.      Multiple small fundic gland polyps with no bleeding and no stigmata of       recent bleeding were found in the gastric fundus and in the gastric body.      Patchy mild inflammation characterized by erythema was found in the       gastric body and in the gastric antrum. Biopsies were taken with a cold       forceps for Helicobacter pylori testing.      The duodenal bulb, first portion of the duodenum and second portion of       the duodenum were normal.      A single 12 mm semi-sessile polyp with no bleeding and no stigmata of       recent bleeding was found in the gastric antrum. Biopsies were taken       with a cold forceps for histology. Impression:               - Esophageal mucosal changes classified as                            Barrett's stage C0-M1 per Prague criteria. Biopsied.                           - Multiple gastric polyps.                           - Gastritis. Biopsied.                           - Normal duodenal bulb, first portion of the                            duodenum and second portion of the duodenum.                           - A single gastric polyp. Biopsied. Moderate Sedation:      Per Anesthesia Care Recommendation:           - Patient has a contact number available for                            emergencies. The signs and symptoms of potential                            delayed complications were discussed with the                            patient. Return to normal activities tomorrow.  Written discharge instructions were provided to the                            patient.                           - Resume previous diet.                           - Continue present medications.                           - Await pathology results.                           - Use Protonix (pantoprazole) 40 mg PO BID.                           - Return to GI clinic in 6  weeks. Procedure Code(s):        --- Professional ---                           [phone], Esophagogastroduodenoscopy, flexible,                            transoral; with biopsy, single or multiple Diagnosis Code(s):        --- Professional ---                           K22.70, Barrett's esophagus without dysplasia                           K31.7, Polyp of stomach and duodenum                           K31.89, Other diseases of stomach and duodenum                           K29.70, Gastritis, unspecified, without bleeding                           R13.10, Dysphagia, unspecified                           R12, Heartburn CPT copyright 2022 American Medical Association. All rights reserved. The codes documented in this report are preliminary and upon coder review may  be revised to meet current compliance requirements. Hennie Duos. Marletta Lor, DO Hennie Duos. Marletta Lor, DO 01/09/2023 7:45:28 AM This report has been signed electronically. Number of Addenda: 0

## 2023-01-09 NOTE — Anesthesia Postprocedure Evaluation (Signed)
Anesthesia Post Note  Patient: KOLBIE ECHELBERGER  Procedure(s) Performed: ESOPHAGOGASTRODUODENOSCOPY (EGD) WITH PROPOFOL BIOPSY  Patient location during evaluation: Phase II Anesthesia Type: General Level of consciousness: awake and alert and oriented Pain management: pain level controlled Vital Signs Assessment: post-procedure vital signs reviewed and stable Respiratory status: spontaneous breathing, nonlabored ventilation and respiratory function stable Cardiovascular status: blood pressure returned to baseline and stable Postop Assessment: no apparent nausea or vomiting Anesthetic complications: no  No notable events documented.   Last Vitals:  Vitals:   01/09/23 0652 01/09/23 0743  BP: (!) 118/55 116/65  Pulse: 66 70  Resp: 14 17  Temp: 36.8 C (!) 36.4 C  SpO2: 98% 95%    Last Pain:  Vitals:   01/09/23 0743  TempSrc: Oral  PainSc: 0-No pain                 Randale Carvalho C Alan Drummer

## 2023-01-09 NOTE — Transfer of Care (Signed)
Immediate Anesthesia Transfer of Care Note  Patient: Heidi [name]  Procedure(s) Performed: ESOPHAGOGASTRODUODENOSCOPY (EGD) WITH PROPOFOL BIOPSY  Patient Location: Short Stay  Anesthesia Type:General  Level of Consciousness: drowsy  Airway & Oxygen Therapy: Patient Spontanous Breathing  Post-op Assessment: Report given to RN and Post -op Vital signs reviewed and stable  Post vital signs: Reviewed and stable  Last Vitals:  Vitals Value Taken Time  BP 116/65 01/09/23 0743  Temp 36.4 C 01/09/23 0743  Pulse 70 01/09/23 0743  Resp 17 01/09/23 0743  SpO2 95 % 01/09/23 0743    Last Pain:  Vitals:   01/09/23 0743  TempSrc: Oral  PainSc: 0-No pain         Complications: No notable events documented.

## 2023-01-09 NOTE — H&P (Signed)
Primary Care Physician:  Janean Sark, NP Primary Gastroenterologist:  Dr. Marletta Lor  Pre-Procedure History & Physical: HPI:  Heidi [name] is a 67 y.o. female is here for an EGD with possible dilation to be performed for GERD, dysphagia  Past Medical History:  Diagnosis Date   Anemia    takes iron supplement   Anxiety    Arthritis    Asthma    prn neb.   Atrial fib/flutter, transient (HCC)    Cancer (HCC)    right breast cancer   CHF (congestive heart failure) (HCC)    Chronic kidney disease    Stage3 Kidney Failure   DDD (degenerative disc disease), cervical    Depression    Diabetes mellitus    IDDM   Diverticulitis    Diverticulitis large intestine    Diverticulosis    Dysrhythmia    Environmental allergies    Fibromyalgia    GERD (gastroesophageal reflux disease)    Headache(784.0)    migraines   History of MRSA infection 08/2012   right breast   Hypertension    under control with med., has been on med. x 10-15 yr.   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    Past Surgical History:  Procedure Laterality Date   ANKLE ARTHROSCOPY Left    ANKLE FUSION Left    BACK SURGERY     BIOPSY  05/15/2020   Procedure: BIOPSY;  Surgeon: Lanelle Bal, DO;  Location: [facility];  Service: Endoscopy;;  gastric    BREAST RECONSTRUCTION  05/24/2012   Procedure: BREAST RECONSTRUCTION;  Surgeon: Louisa Second, MD;  Location: [facility];  Service: Plastics;  Laterality: Right;  NO FLEX HD   BREAST RECONSTRUCTION Right 01/17/2013   Procedure: RIGHT BREAST RECONSTRUCTION WITH PLACEMENT OF TISSUE EXPANDER ;  Surgeon: Louisa Second, MD;  Location: [facility];   Service: Plastics;  Laterality: Right;   BREAST REDUCTION SURGERY Left 02/28/2013   Procedure: LEFT BREAST REDUCTION  (BREAST);  Surgeon: Louisa Second, MD;  Location: [facility];  Service: Plastics;  Laterality: Left;   CESAREAN SECTION     CHOLECYSTECTOMY     COLONOSCOPY WITH PROPOFOL N/A 08/14/2020   non-bleeding internal hemorrhoids, multiple small mouth diverticula, 5 mm polyp in descending colon, fair prep. Tubular adenoma. Surveillance in 5 years.    ESOPHAGOGASTRODUODENOSCOPY (EGD) WITH PROPOFOL N/A 05/15/2020   gastritis, reactive gastropathy. negative H.pylori. one gastric polyp. normal duodenum   INSERT / REPLACE / REMOVE PACEMAKER     MASTECTOMY Right 03/2012   NASAL SEPTUM SURGERY     POLYPECTOMY  05/15/2020   Procedure: POLYPECTOMY;  Surgeon: Lanelle Bal, DO;  [facility]: [facility];  Service: Endoscopy;;  antral   POLYPECTOMY  08/14/2020   Procedure: POLYPECTOMY;  Surgeon: Lanelle Bal, DO;  [facility]: [facility];  Service: Endoscopy;;   REMOVAL OF TISSUE EXPANDER AND PLACEMENT OF IMPLANT Right 05/10/2013   Procedure: RIGHT REMOVAL OF TISSUE EXPANDER ;  Surgeon: Louisa Second, MD;  Location: [facility];  Service: Plastics;  Laterality: Right;   REVERSE SHOULDER ARTHROPLASTY Right 03/07/2021   Procedure: REVERSE SHOULDER  ARTHROPLASTY;  Surgeon: Jones Broom, MD;  Location: [facility];  Service: Orthopedics;  Laterality: Right;   SHOULDER ARTHROSCOPY Right    SHOULDER ARTHROSCOPY WITH SUBACROMIAL DECOMPRESSION Left 06/20/2021   Procedure: SHOULDER ARTHROSCOPY ROTATOR CUFF DEBRIDEMENT WITH SUBACROMIAL DECOMPRESSION, BICEPS TENOTOMY;  Surgeon: Jones Broom, MD;  Location: [facility];  Service: Orthopedics;  Laterality: Left;   TISSUE EXPANDER PLACEMENT  05/24/2012   Procedure: TISSUE EXPANDER;  Surgeon: Louisa Second, MD;  Location: [facility];  Service: Plastics;  Laterality: Right;    Prior to Admission  medications   Medication Sig Start Date End Date Taking? Authorizing Provider  albuterol (PROVENTIL) (2.5 MG/3ML) 0.083% nebulizer solution Take 2.5 mg by nebulization every 6 (six) hours as needed for wheezing or shortness of breath.   Yes [provider]  Albuterol Sulfate (PROAIR RESPICLICK) 108 (90 Base) MCG/ACT AEPB Inhale 1 puff into the lungs every 6 (six) hours as needed (shortness of breath or wheezing). 05/07/22  Yes [provider]  allopurinol (ZYLOPRIM) 100 MG tablet Take 100 mg by mouth 2 (two) times daily as needed (gout).   Yes [provider]  ALPRAZolam Prudy Feeler) 0.5 MG tablet Take 0.5 mg by mouth 2 (two) times daily as needed for anxiety. 01/29/22  Yes [provider]  apixaban (ELIQUIS) 5 MG TABS tablet Take 5 mg by mouth 2 (two) times daily.   Yes [provider]  benazepril-hydrochlorthiazide (LOTENSIN HCT) 10-12.5 MG tablet Take 1 tablet by mouth daily. 07/23/11  Yes [provider]  benztropine (COGENTIN) 0.5 MG tablet Take 0.5 mg by mouth 2 (two) times daily as needed for tremors.   Yes [provider]  Biotin 5 MG TABS Take 5 mg by mouth daily.   Yes [provider]  BREZTRI AEROSPHERE 160-9-4.8 MCG/ACT AERO Inhale 2 puffs into the lungs in the morning and at bedtime. 10/01/22  Yes [provider]  Cholecalciferol (VITAMIN D) 50 MCG (2000 UT) CAPS Take 1 capsule by mouth once daily 07/18/21  Yes Pennington, Rebekah M, PA-C  clotrimazole (LOTRIMIN) 1 % cream Apply 1 Application topically 2 (two) times daily. 07/31/21  Yes [provider]  colchicine 0.6 MG tablet Take 0.6 mg by mouth daily as needed (gout flare).   Yes [provider]  cyanocobalamin (,VITAMIN B-12,) 1000 MCG/ML injection Inject 1,000 mcg into the muscle every 30 (thirty) days. 10/29/20  Yes [provider]  cyclobenzaprine (FLEXERIL) 5 MG tablet Take 5 mg by mouth 3 (three) times daily as needed for pain.  03/06/21  Yes [provider]  cycloSPORINE (RESTASIS) 0.05 % ophthalmic emulsion Place 1 drop into both eyes 2 (two) times daily.   Yes [provider]  DULoxetine (CYMBALTA) 60 MG capsule Take 60 mg by mouth 2 (two) times daily.   Yes [provider]  folic acid (FOLVITE) 1 MG tablet Take 1 mg by mouth daily. 09/27/20  Yes [provider]  lamoTRIgine (LAMICTAL) 150 MG tablet Take 150 mg by mouth 2 (two) times daily. 08/01/20  Yes [provider]  metFORMIN (GLUCOPHAGE-XR) 500 MG 24 hr tablet Take 500 mg by mouth in the morning and at bedtime.   Yes [provider]  NYSTATIN powder Apply 2-3 application topically daily as needed (Stomach). 09/15/20  Yes [provider]  OXYTROL FOR WOMEN 3.9 MG/24HR Place 1 patch onto the skin every 3 (three) days.   Yes [provider]  pantoprazole (PROTONIX) 40 MG tablet Take 40 mg by mouth daily.  Yes [provider]  pregabalin (LYRICA) 75 MG capsule Take 75 mg by mouth 3 (three) times daily.   Yes [provider]  propranolol (INDERAL) 10 MG tablet Take 10 mg by mouth 2 (two) times daily.   Yes [provider]  tirzepatide Greggory Keen) 5 MG/0.5ML Pen Inject 5 mg into the skin every Sunday. 11/25/22  Yes [provider]  ACCU-CHEK GUIDE test strip See admin instructions. 07/23/21   [provider]  B-D 3CC LUER-LOK SYR 25GX1" 25G X 1" 3 ML MISC USE AS DIRECTED MONTHLY 07/06/21   [provider]  BD PEN NEEDLE MICRO U/F 32G X 6 MM MISC Inject into the skin daily. 08/07/21   [provider]  Buprenorphine HCl-Naloxone HCl 2-0.5 MG FILM Place 1 Film under the tongue at bedtime. 03/06/21   [provider]  hydrOXYzine (ATARAX) 10 MG tablet Take 10 mg by mouth 3 (three) times daily as needed for anxiety.    [provider]    Allergies as of 12/03/2022 - Review Complete 12/02/2022  Allergen Reaction Noted   Soap Other  (See Comments) 02/22/2013   Allevyn adhesive [wound dressings]  12/05/2020   Aripiprazole  12/05/2020   Cipro [ciprofloxacin hcl] Diarrhea and Nausea And Vomiting 01/02/2021   Ketorolac tromethamine  12/05/2020   Latuda [lurasidone hcl]  02/26/2021   Lurasidone  09/05/2021   Mixed grasses Hives 12/05/2020   Pollen extract  12/05/2020   Prednisone  12/05/2020   Requip [ropinirole]  12/05/2020   Risperidone  12/05/2020   Sulfamethoxazole-trimethoprim Hives and Itching 12/05/2020   Vraylar [cariprazine]  08/03/2020   Latex Itching 02/22/2013   Sulfa antibiotics Nausea Only and Rash 05/20/2012   Tape Rash 09/05/2021    Family History  Problem Relation Age of Onset   Colon cancer Paternal Grandmother    Colon cancer Paternal Grandfather     Social History   Socioeconomic History   Marital status: Married    Spouse name: Not on file   Number of children: Not on file   Years of education: Not on file   Highest education level: Not on file  Occupational History   Not on file  Tobacco Use   Smoking status: Never   Smokeless tobacco: Never  Vaping Use   Vaping Use: Never used  Substance and Sexual Activity   Alcohol use: No   Drug use: No   Sexual activity: Not Currently  Other Topics Concern   Not on file  Social History Narrative   Not on file   Social Determinants of Health   Financial Resource Strain: Not on file  Food Insecurity: No Food Insecurity (12/20/2020)   Hunger Vital Sign    Worried About Running Out of Food in the Last Year: Never true    Ran Out of Food in the Last Year: Never true  Transportation Needs: No Transportation Needs (12/20/2020)   PRAPARE - Administrator, Civil Service (Medical): No    Lack of Transportation (Non-Medical): No  Physical Activity: Inactive (12/20/2020)   Exercise Vital Sign    Days of Exercise per Week: 0 days    Minutes of Exercise per Session: 0 min  Stress: No Stress Concern Present (12/20/2020)   [employer] - Occupational Stress Questionnaire    Feeling of Stress : Only a little  Social Connections: Not on file  Intimate Partner Violence: Not At Risk (12/20/2020)   Humiliation, Afraid, Rape, and Kick questionnaire  Fear of Current or Ex-Partner: No    Emotionally Abused: No    Physically Abused: No    Sexually Abused: No    Review of Systems: General: Negative for fever, chills, fatigue, weakness. Eyes: Negative for vision changes.  ENT: Negative for hoarseness, difficulty swallowing , nasal congestion. CV: Negative for chest pain, angina, palpitations, dyspnea on exertion, peripheral edema.  Respiratory: Negative for dyspnea at rest, dyspnea on exertion, cough, sputum, wheezing.  GI: See history of present illness. GU:  Negative for dysuria, hematuria, urinary incontinence, urinary frequency, nocturnal urination.  MS: Negative for joint pain, low back pain.  Derm: Negative for rash or itching.  Neuro: Negative for weakness, abnormal sensation, seizure, frequent headaches, memory loss, confusion.  Psych: Negative for anxiety, depression Endo: Negative for unusual weight change.  Heme: Negative for bruising or bleeding. Allergy: Negative for rash or hives.  Physical Exam: Vital signs in last 24 hours: Temp:  [98.2 F (36.8 C)] 98.2 F (36.8 C) (05/24 0652) Pulse Rate:  [66] 66 (05/24 0652) Resp:  [14] 14 (05/24 0652) BP: (118)/(55) 118/55 (05/24 0652) SpO2:  [98 %] 98 % (05/24 0652) Weight:  [122.6 kg] 122.6 kg (05/24 1610)   General:   Alert,  Well-developed, well-nourished, pleasant and cooperative in NAD Head:  Normocephalic and atraumatic. Eyes:  Sclera clear, no icterus.   Conjunctiva pink. Ears:  Normal auditory acuity. Nose:  No deformity, discharge,  or lesions. Msk:  Symmetrical without gross deformities. Normal posture. Extremities:  Without clubbing or edema. Neurologic:  Alert and  oriented x4;  grossly normal  neurologically. Skin:  Intact without significant lesions or rashes. Psych:  Alert and cooperative. Normal mood and affect.   Impression/Plan: YARELIN MERENDA is here for an EGD with possible dilation to be performed for GERD, dysphagia  Risks, benefits, limitations, imponderables and alternatives regarding EGD have been reviewed with the patient. Questions have been answered. All parties agreeable.

## 2023-01-13 LAB — SURGICAL PATHOLOGY

## 2023-01-16 ENCOUNTER — Encounter (HOSPITAL_COMMUNITY): Payer: Self-pay | Admitting: Internal Medicine

## 2023-02-18 ENCOUNTER — Inpatient Hospital Stay: Payer: Medicare Other | Attending: Physician Assistant

## 2023-02-18 DIAGNOSIS — K219 Gastro-esophageal reflux disease without esophagitis: Secondary | ICD-10-CM | POA: Diagnosis not present

## 2023-02-18 DIAGNOSIS — E1122 Type 2 diabetes mellitus with diabetic chronic kidney disease: Secondary | ICD-10-CM | POA: Diagnosis not present

## 2023-02-18 DIAGNOSIS — E559 Vitamin D deficiency, unspecified: Secondary | ICD-10-CM | POA: Diagnosis not present

## 2023-02-18 DIAGNOSIS — R2689 Other abnormalities of gait and mobility: Secondary | ICD-10-CM | POA: Insufficient documentation

## 2023-02-18 DIAGNOSIS — Z86000 Personal history of in-situ neoplasm of breast: Secondary | ICD-10-CM | POA: Insufficient documentation

## 2023-02-18 DIAGNOSIS — Z9049 Acquired absence of other specified parts of digestive tract: Secondary | ICD-10-CM | POA: Insufficient documentation

## 2023-02-18 DIAGNOSIS — Z79899 Other long term (current) drug therapy: Secondary | ICD-10-CM | POA: Insufficient documentation

## 2023-02-18 DIAGNOSIS — M3500 Sicca syndrome, unspecified: Secondary | ICD-10-CM | POA: Diagnosis not present

## 2023-02-18 DIAGNOSIS — D500 Iron deficiency anemia secondary to blood loss (chronic): Secondary | ICD-10-CM

## 2023-02-18 DIAGNOSIS — G894 Chronic pain syndrome: Secondary | ICD-10-CM | POA: Insufficient documentation

## 2023-02-18 DIAGNOSIS — Z86010 Personal history of colonic polyps: Secondary | ICD-10-CM | POA: Diagnosis not present

## 2023-02-18 DIAGNOSIS — Z800 Family history of malignant neoplasm of digestive organs: Secondary | ICD-10-CM | POA: Insufficient documentation

## 2023-02-18 DIAGNOSIS — M797 Fibromyalgia: Secondary | ICD-10-CM | POA: Insufficient documentation

## 2023-02-18 DIAGNOSIS — N183 Chronic kidney disease, stage 3 unspecified: Secondary | ICD-10-CM | POA: Diagnosis not present

## 2023-02-18 DIAGNOSIS — D509 Iron deficiency anemia, unspecified: Secondary | ICD-10-CM | POA: Insufficient documentation

## 2023-02-18 DIAGNOSIS — I4891 Unspecified atrial fibrillation: Secondary | ICD-10-CM | POA: Insufficient documentation

## 2023-02-18 DIAGNOSIS — R5383 Other fatigue: Secondary | ICD-10-CM | POA: Diagnosis not present

## 2023-02-18 DIAGNOSIS — K921 Melena: Secondary | ICD-10-CM | POA: Diagnosis not present

## 2023-02-18 DIAGNOSIS — E538 Deficiency of other specified B group vitamins: Secondary | ICD-10-CM | POA: Diagnosis not present

## 2023-02-18 DIAGNOSIS — M069 Rheumatoid arthritis, unspecified: Secondary | ICD-10-CM | POA: Diagnosis not present

## 2023-02-18 DIAGNOSIS — Z7901 Long term (current) use of anticoagulants: Secondary | ICD-10-CM | POA: Insufficient documentation

## 2023-02-18 DIAGNOSIS — I130 Hypertensive heart and chronic kidney disease with heart failure and stage 1 through stage 4 chronic kidney disease, or unspecified chronic kidney disease: Secondary | ICD-10-CM | POA: Insufficient documentation

## 2023-02-18 DIAGNOSIS — Z7984 Long term (current) use of oral hypoglycemic drugs: Secondary | ICD-10-CM | POA: Diagnosis not present

## 2023-02-18 DIAGNOSIS — G2581 Restless legs syndrome: Secondary | ICD-10-CM | POA: Insufficient documentation

## 2023-02-18 LAB — COMPREHENSIVE METABOLIC PANEL
ALT: 15 U/L (ref 0–44)
AST: 17 U/L (ref 15–41)
Albumin: 3.6 g/dL (ref 3.5–5.0)
Alkaline Phosphatase: 75 U/L (ref 38–126)
Anion gap: 13 (ref 5–15)
BUN: 15 mg/dL (ref 8–23)
CO2: 22 mmol/L (ref 22–32)
Calcium: 9 mg/dL (ref 8.9–10.3)
Chloride: 102 mmol/L (ref 98–111)
Creatinine, Ser: 1.1 mg/dL — ABNORMAL HIGH (ref 0.44–1.00)
GFR, Estimated: 56 mL/min — ABNORMAL LOW (ref >60)
Glucose, Bld: 111 mg/dL — ABNORMAL HIGH (ref 70–99)
Potassium: 4 mmol/L (ref 3.5–5.1)
Sodium: 137 mmol/L (ref 135–145)
Total Bilirubin: 0.4 mg/dL (ref 0.3–1.2)
Total Protein: 6.4 g/dL — ABNORMAL LOW (ref 6.5–8.1)

## 2023-02-18 LAB — IRON AND TIBC
Iron: 48 ug/dL (ref 28–170)
Saturation Ratios: 14 % (ref 10.4–31.8)
TIBC: 342 ug/dL (ref 250–450)
UIBC: 294 ug/dL

## 2023-02-18 LAB — CBC WITH DIFFERENTIAL/PLATELET
Abs Immature Granulocytes: 0.04 10*3/uL (ref 0.00–0.07)
Basophils Absolute: 0 10*3/uL (ref 0.0–0.1)
Basophils Relative: 1 %
Eosinophils Absolute: 0.2 10*3/uL (ref 0.0–0.5)
Eosinophils Relative: 4 %
HCT: 35.4 % — ABNORMAL LOW (ref 36.0–46.0)
Hemoglobin: 11.6 g/dL — ABNORMAL LOW (ref 12.0–15.0)
Immature Granulocytes: 1 %
Lymphocytes Relative: 20 %
Lymphs Abs: 1.3 10*3/uL (ref 0.7–4.0)
MCH: 29.7 pg (ref 26.0–34.0)
MCHC: 32.8 g/dL (ref 30.0–36.0)
MCV: 90.5 fL (ref 80.0–100.0)
Monocytes Absolute: 0.4 10*3/uL (ref 0.1–1.0)
Monocytes Relative: 6 %
Neutro Abs: 4.6 10*3/uL (ref 1.7–7.7)
Neutrophils Relative %: 68 %
Platelets: 188 10*3/uL (ref 150–400)
RBC: 3.91 MIL/uL (ref 3.87–5.11)
RDW: 14.4 % (ref 11.5–15.5)
WBC: 6.6 10*3/uL (ref 4.0–10.5)
nRBC: 0 % (ref 0.0–0.2)

## 2023-02-18 LAB — FERRITIN: Ferritin: 55 ng/mL (ref 11–307)

## 2023-02-18 LAB — VITAMIN D 25 HYDROXY (VIT D DEFICIENCY, FRACTURES): Vit D, 25-Hydroxy: 26.28 ng/mL — ABNORMAL LOW (ref 30–100)

## 2023-02-18 LAB — VITAMIN B12: Vitamin B-12: 416 pg/mL (ref 180–914)

## 2023-02-22 LAB — METHYLMALONIC ACID, SERUM: Methylmalonic Acid, Quantitative: 192 nmol/L (ref 0–378)

## 2023-02-24 ENCOUNTER — Other Ambulatory Visit: Payer: Self-pay

## 2023-02-24 DIAGNOSIS — R1310 Dysphagia, unspecified: Secondary | ICD-10-CM

## 2023-02-24 DIAGNOSIS — K219 Gastro-esophageal reflux disease without esophagitis: Secondary | ICD-10-CM

## 2023-02-24 DIAGNOSIS — K2270 Barrett's esophagus without dysplasia: Secondary | ICD-10-CM

## 2023-02-24 MED ORDER — DEXLANSOPRAZOLE 60 MG PO CPDR
60.0000 mg | DELAYED_RELEASE_CAPSULE | Freq: Every day | ORAL | 3 refills | Status: AC
Start: 2023-02-24 — End: ?

## 2023-02-26 ENCOUNTER — Inpatient Hospital Stay: Payer: Medicare Other | Admitting: Physician Assistant

## 2023-02-26 ENCOUNTER — Inpatient Hospital Stay (HOSPITAL_BASED_OUTPATIENT_CLINIC_OR_DEPARTMENT_OTHER): Payer: Medicare Other | Admitting: Physician Assistant

## 2023-02-26 VITALS — BP 156/78 | HR 62 | Temp 99.2°F | Resp 17 | Ht 66.0 in | Wt 265.2 lb

## 2023-02-26 DIAGNOSIS — E538 Deficiency of other specified B group vitamins: Secondary | ICD-10-CM

## 2023-02-26 DIAGNOSIS — D500 Iron deficiency anemia secondary to blood loss (chronic): Secondary | ICD-10-CM | POA: Diagnosis not present

## 2023-02-26 DIAGNOSIS — E559 Vitamin D deficiency, unspecified: Secondary | ICD-10-CM

## 2023-02-26 DIAGNOSIS — D509 Iron deficiency anemia, unspecified: Secondary | ICD-10-CM | POA: Diagnosis not present

## 2023-02-26 MED ORDER — VITAMIN D 50 MCG (2000 UT) PO CAPS
2000.0000 [IU] | ORAL_CAPSULE | Freq: Every day | ORAL | 3 refills | Status: AC
Start: 2023-02-26 — End: ?

## 2023-02-26 NOTE — Patient Instructions (Signed)
[facility] **VISIT SUMMARY & IMPORTANT INSTRUCTIONS **   You were seen today by Rojelio Brenner PA-C for your follow-up visit.    IRON DEFICIENCY ANEMIA Your blood and iron levels are mildly low.  This is most likely due to intermittent bleeding from somewhere in your intestines. Will schedule you for IV iron x 2 doses.  OTHER VITAMIN DEFICIENCIES: Continue taking vitamin B12 injections once a month START taking vitamin D 2000 units (50 mcg) once daily.  FOLLOW-UP APPOINTMENT: Labs in 6 months followed by PHONE visit  ** Thank you for trusting me with your healthcare!  I strive to provide all of my patients with quality care at each visit.  If you receive a survey for this visit, I would be so grateful to you for taking the time to provide feedback.  Thank you in advance!  ~ Zaniyah Wernette                   Dr. Doreatha Massed   &   Rojelio Brenner, PA-C   - - - - - - - - - - - - - - - - - -    Thank you for choosing [facility] at [facility] to provide your oncology and hematology care.  To afford each patient quality time with our provider, please arrive at least 15 minutes before your scheduled appointment time.   If you have a lab appointment with the Cancer Center please come in thru the Main Entrance and check in at the main information desk.  You need to re-schedule your appointment should you arrive 10 or more minutes late.  We strive to give you quality time with our providers, and arriving late affects you and other patients whose appointments are after yours.  Also, if you no show three or more times for appointments you may be dismissed from the clinic at the providers discretion.     Again, thank you for choosing [facility].  Our hope is that these requests will decrease the amount of time that you wait before being seen by our physicians.        _____________________________________________________________  Should you have questions after your visit to [facility], please contact our office at [phone] and follow the prompts.  Our office hours are 8:00 a.m. and 4:30 p.m. Monday - Friday.  Please note that voicemails left after 4:00 p.m. may not be returned until the following business day.  We are closed weekends and major holidays.  You do have access to a nurse 24-7, just call the main number to the clinic [phone] and do not press any options, hold on the line and a nurse will answer the phone.    For prescription refill requests, have your pharmacy contact our office and allow 72 hours.

## 2023-02-26 NOTE — Progress Notes (Signed)
[facility] [address], [place] [address]   CLINIC:  Medical Oncology/Hematology  PCP:  Janean Sark, NP [address] [place] [address] [phone]   REASON FOR VISIT:  Follow-up for iron deficiency anemia   CURRENT THERAPY: Intermittent IV iron (last IV Venofer 06/03/2021)  INTERVAL HISTORY:   Heidi [name] 67 y.o. female returns for routine follow-up of her iron deficiency anemia.  She was last evaluated by Rojelio Brenner PA-C via telemedicine visit on 08/28/2022.   At today's visit, she reports feeling fair.   She continues to have some respiratory issues secondary to post pneumonia lung injury, but these are well-controlled with inhalers and nebulizers.  She continues to follow with neurology due to gait instability and foot drop.  She was recently told that she has stage III CKD and is following with nephrologist in [place].  She also reports that she has started seeing urologist for urinary incontinence.  She reports that she "feels like her iron is low," due to increased fatigue that she has been noticing.  She continues to have intermittent melanotic stool, most recently occurred 2 to 3 weeks ago.  She denies any rectal bleeding.  No pica, headaches, chest pain, lightheadedness, syncope.  She has 40% energy and 100% appetite.  She endorses that she is maintaining a stable weight.  ASSESSMENT & PLAN:  1.  Microcytic iron deficient anemia secondary to chronic GI blood loss - Intermittent melena for the past year.  However, gastroenterology work-up thus far has not found specific source of bleeding. -- She has been on Eliquis since summer 2023 due to Afib - EGD on 05/15/2020 which showed gastritis and one gastric polyp. - Colonoscopy on 08/14/2020 showed nonbleeding internal hemorrhoids, diverticulosis, and one polyp. - Patient is unable to have capsule endoscopy due to implanted pacemaker.   - Work-up for other causes of anemia was  unremarkable: Normal SPEP/IFE/free light chains, normal B12/MMA, folate, copper; LDH normal; reticulocytes low at 2.2 likely hypoproliferation in the setting of iron deficiency; erythropoietin appropriately elevated at 119.4 - Unable to tolerate iron pill due to severe constipation - Most recent IV Venofer 06/03/2021. - No rectal bleeding.  Intermittent black/sticky bowel movements. - She continues to have severe chronic fatigue which is multifactorial - Most recent labs (02/18/2023): Hgb 11.6/MCV 90.5, ferritin 55, iron saturation 14% - PLAN: Recommend IV Feraheme x 2. - Labs and PHONE visit in 6 months   2.  History of right breast DCIS - Patient does have a history of stage 0 breast cancer, multicentric DCIS of the right breast, s/p right mastectomy.  Pathology showed no residual disease, ER and PR status could not be assessed. Patient was on norethindrone for abnormal uterine function at the time of her diagnosis.  She did not receive chemo or radiation; she was not placed on tamoxifen or anastrozole.  Her oncologist discharged her to PCP for annual breast exams and mammograms. - Most recent mammogram reportedly normal   - PLAN: Continue annual mammograms and breast exams with PCP.    3.  Vitamin D deficiency - Labs (12/20/2020) show decreased vitamin D at 20.74 - Most recent vitamin D (02/18/2023): Mildly low at 26.28 - She reports that she has not been taking her vitamin D supplement for the past several months. - PLAN: Recommend starting vitamin D 2000 units daily. - We will recheck vitamin D at follow-up visit in 6 months   4.  Vitamin B12 deficiency - She receives  vitamin B12 injections monthly - Most recent labs (02/18/2023): Normal vitamin B12 416, normal MMA - PLAN: Continue monthly B12 injections.  We will recheck B12/MMA in 1 year.  5.  Other history - PMH:  Other PMH includes type 2 diabetes mellitus, hypertension, tachybradycardia syndrome s/p pacemaker, Sjogren's disease, chronic  pain syndrome (fibromyalgia and rheumatoid arthritis) on methadone, and other conditions noted elsewhere in medical record.  She is awaiting rotator cuff surgery of both shoulders.  She has chronic back pain and spinal stenosis.  She has sleep apnea, uses CPAP at home. - Social history: She lives at home with her husband.  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 She denies tobacco, alcohol, illicit drug use. - Family history:  Her family history is positive for paternal grandfather with colon cancer, paternal grandmother with ovarian cancer.   PLAN SUMMARY: >> IV Feraheme x 2 >> Labs in 6 months = CBC/D, ferritin, iron/TIBC, vitamin D >> PHONE visit in 6 months, 1 week after labs     REVIEW OF SYSTEMS:   Review of Systems  Constitutional:  Positive for fatigue. Negative for appetite change, chills, diaphoresis, fever and unexpected weight change.  HENT:   Positive for trouble swallowing. Negative for lump/mass and nosebleeds.   Eyes:  Negative for eye problems.  Respiratory:  Negative for cough, hemoptysis and shortness of breath.   Cardiovascular:  Negative for chest pain, leg swelling and palpitations.  Gastrointestinal:  Negative for abdominal pain, blood in stool, constipation, diarrhea, nausea and vomiting.  Genitourinary:  Positive for bladder incontinence. Negative for hematuria.   Musculoskeletal:  Positive for back pain.  Skin: Negative.   Neurological:  Positive for numbness. Negative for dizziness, headaches and light-headedness.  Hematological:  Does not bruise/bleed easily.     PHYSICAL EXAM:  ECOG PERFORMANCE STATUS: 3 - Symptomatic, >50% confined to bed  There were no vitals filed for this visit. There were no vitals filed for this visit. Physical Exam Constitutional:      Appearance: Normal appearance. She is obese.  Cardiovascular:     Heart sounds: Normal heart sounds.     Comments: Does not appear to be in atrial fibrillation  at today's visit Pulmonary:     Breath sounds: Normal breath sounds.  Neurological:     General: No focal deficit present.     Mental Status: Mental status is at baseline.  Psychiatric:        Behavior: Behavior normal. Behavior is cooperative.     PAST MEDICAL/SURGICAL HISTORY:  Past Medical History:  Diagnosis Date   Anemia    takes iron supplement   Anxiety    Arthritis    Asthma    prn neb.   Atrial fib/flutter, transient (HCC)    Cancer (HCC)    right breast cancer   CHF (congestive heart failure) (HCC)    Chronic kidney disease    Stage3 Kidney Failure   DDD (degenerative disc disease), cervical    Depression    Diabetes mellitus    IDDM   Diverticulitis    Diverticulitis large intestine    Diverticulosis    Dysrhythmia    Environmental allergies    Fibromyalgia    GERD (gastroesophageal reflux disease)    Headache(784.0)    migraines   History of MRSA infection 08/2012   right breast   Hypertension    under control with med., has been on med. x 10-15 yr.   Overactive bladder  Pneumonia    PONV (postoperative nausea and vomiting)    used scop patch 7/14-please use again per pt request   Presence of permanent cardiac pacemaker    Restless leg syndrome    Sjogren's disease (HCC)    Sleep apnea    uses CPAP nightly   Ventilator associated pneumonia (HCC) 01/2022   Past Surgical History:  Procedure Laterality Date   ANKLE ARTHROSCOPY Left    ANKLE FUSION Left    BACK SURGERY     BIOPSY  05/15/2020   Procedure: BIOPSY;  Surgeon: Lanelle Bal, DO;  [facility];  Service: Endoscopy;;  gastric    BIOPSY  01/09/2023   Procedure: BIOPSY;  Surgeon: Lanelle Bal, DO;  [facility];  Service: Endoscopy;;   BREAST RECONSTRUCTION  05/24/2012   Procedure: BREAST RECONSTRUCTION;  Surgeon: Louisa Second, MD;  Location: [facility];  Service: Plastics;  Laterality: Right;  NO FLEX HD   BREAST RECONSTRUCTION Right  01/17/2013   Procedure: RIGHT BREAST RECONSTRUCTION WITH PLACEMENT OF TISSUE EXPANDER ;  Surgeon: Louisa Second, MD;  Location: [facility];  Service: Plastics;  Laterality: Right;   BREAST REDUCTION SURGERY Left 02/28/2013   Procedure: LEFT BREAST REDUCTION  (BREAST);  Surgeon: Louisa Second, MD;  Location: [facility];  Service: Plastics;  Laterality: Left;   CESAREAN SECTION     CHOLECYSTECTOMY     COLONOSCOPY WITH PROPOFOL N/A 08/14/2020   non-bleeding internal hemorrhoids, multiple small mouth diverticula, 5 mm polyp in descending colon, fair prep. Tubular adenoma. Surveillance in 5 years.    ESOPHAGOGASTRODUODENOSCOPY (EGD) WITH PROPOFOL N/A 05/15/2020   gastritis, reactive gastropathy. negative H.pylori. one gastric polyp. normal duodenum   ESOPHAGOGASTRODUODENOSCOPY (EGD) WITH PROPOFOL N/A 01/09/2023   Procedure: ESOPHAGOGASTRODUODENOSCOPY (EGD) WITH PROPOFOL;  Surgeon: Lanelle Bal, DO;  [facility];  Service: Endoscopy;  Laterality: N/A;  10:30 AM, ASA 3, pt knows to arrive at 6:15   INSERT / REPLACE / REMOVE PACEMAKER     MASTECTOMY Right 03/2012   NASAL SEPTUM SURGERY     POLYPECTOMY  05/15/2020   Procedure: POLYPECTOMY;  Surgeon: Lanelle Bal, DO;  [facility];  Service: Endoscopy;;  antral   POLYPECTOMY  08/14/2020   Procedure: POLYPECTOMY;  Surgeon: Lanelle Bal, DO;  [facility];  Service: Endoscopy;;   REMOVAL OF TISSUE EXPANDER AND PLACEMENT OF IMPLANT Right 05/10/2013   Procedure: RIGHT REMOVAL OF TISSUE EXPANDER ;  Surgeon: Louisa Second, MD;  Location: [facility];  Service: Plastics;  Laterality: Right;   REVERSE SHOULDER ARTHROPLASTY Right 03/07/2021   Procedure: REVERSE SHOULDER ARTHROPLASTY;  Surgeon: Jones Broom, MD;  Location: [facility];  Service: Orthopedics;  Laterality: Right;   SHOULDER ARTHROSCOPY Right    SHOULDER ARTHROSCOPY WITH SUBACROMIAL DECOMPRESSION  Left 06/20/2021   Procedure: SHOULDER ARTHROSCOPY ROTATOR CUFF DEBRIDEMENT WITH SUBACROMIAL DECOMPRESSION, BICEPS TENOTOMY;  Surgeon: Jones Broom, MD;  Location: [facility];  Service: Orthopedics;  Laterality: Left;   TISSUE EXPANDER PLACEMENT  05/24/2012   Procedure: TISSUE EXPANDER;  Surgeon: Louisa Second, MD;  Location: [facility];  Service: Plastics;  Laterality: Right;    SOCIAL HISTORY:  Social History   Socioeconomic History   Marital status: Married    Spouse name: Not on file   Number of children: Not on file   Years of education: Not on file   Highest education level: Not on file  Occupational History  Not on file  Tobacco Use   Smoking status: Never   Smokeless tobacco: Never  Vaping Use   Vaping status: Never Used  Substance and Sexual Activity   Alcohol use: No   Drug use: No   Sexual activity: Not Currently  Other Topics Concern   Not on file  Social History Narrative   Not on file   Social Determinants of Health   Financial Resource Strain: Not on file  Food Insecurity: No Food Insecurity (12/20/2020)   Hunger Vital Sign    Worried About Running Out of Food in the Last Year: Never true    Ran Out of Food in the Last Year: Never true  Transportation Needs: No Transportation Needs (12/20/2020)   PRAPARE - Administrator, Civil Service (Medical): No    Lack of Transportation (Non-Medical): No  Physical Activity: Inactive (12/20/2020)   Exercise Vital Sign    Days of Exercise per Week: 0 days    Minutes of Exercise per Session: 0 min  Stress: No Stress Concern Present (12/20/2020)   [employer] of Occupational Health - Occupational Stress Questionnaire    Feeling of Stress : Only a little  Social Connections: Not on file  Intimate Partner Violence: Not At Risk (12/20/2020)   Humiliation, Afraid, Rape, and Kick questionnaire    Fear of Current or Ex-Partner: No    Emotionally Abused: No    Physically Abused: No    Sexually  Abused: No    FAMILY HISTORY:  Family History  Problem Relation Age of Onset   Colon cancer Paternal Grandmother    Colon cancer Paternal Grandfather     CURRENT MEDICATIONS:  Outpatient Encounter Medications as of 02/26/2023  Medication Sig   ACCU-CHEK GUIDE test strip See admin instructions.   albuterol (PROVENTIL) (2.5 MG/3ML) 0.083% nebulizer solution Take 2.5 mg by nebulization every 6 (six) hours as needed for wheezing or shortness of breath.   Albuterol Sulfate (PROAIR RESPICLICK) 108 (90 Base) MCG/ACT AEPB Inhale 1 puff into the lungs every 6 (six) hours as needed (shortness of breath or wheezing).   allopurinol (ZYLOPRIM) 100 MG tablet Take 100 mg by mouth 2 (two) times daily as needed (gout).   ALPRAZolam (XANAX) 0.5 MG tablet Take 0.5 mg by mouth 2 (two) times daily as needed for anxiety.   apixaban (ELIQUIS) 5 MG TABS tablet Take 5 mg by mouth 2 (two) times daily.   B-D 3CC LUER-LOK SYR 25GX1" 25G X 1" 3 ML MISC USE AS DIRECTED MONTHLY   BD PEN NEEDLE MICRO U/F 32G X 6 MM MISC Inject into the skin daily.   benazepril-hydrochlorthiazide (LOTENSIN HCT) 10-12.5 MG tablet Take 1 tablet by mouth daily.   benztropine (COGENTIN) 0.5 MG tablet Take 0.5 mg by mouth 2 (two) times daily as needed for tremors.   Biotin 5 MG TABS Take 5 mg by mouth daily.   BREZTRI AEROSPHERE 160-9-4.8 MCG/ACT AERO Inhale 2 puffs into the lungs in the morning and at bedtime.   Buprenorphine HCl-Naloxone HCl 2-0.5 MG FILM Place 1 Film under the tongue at bedtime.   Cholecalciferol (VITAMIN D) 50 MCG (2000 UT) CAPS Take 1 capsule by mouth once daily   clotrimazole (LOTRIMIN) 1 % cream Apply 1 Application topically 2 (two) times daily.   colchicine 0.6 MG tablet Take 0.6 mg by mouth daily as needed (gout flare).   cyanocobalamin (,VITAMIN B-12,) 1000 MCG/ML injection Inject 1,000 mcg into the muscle every 30 (thirty) days.   cyclobenzaprine (  FLEXERIL) 5 MG tablet Take 5 mg by mouth 3 (three) times daily as  needed for pain.   cycloSPORINE (RESTASIS) 0.05 % ophthalmic emulsion Place 1 drop into both eyes 2 (two) times daily.   dexlansoprazole (DEXILANT) 60 MG capsule Take 1 capsule (60 mg total) by mouth daily.   DULoxetine (CYMBALTA) 60 MG capsule Take 60 mg by mouth 2 (two) times daily.   folic acid (FOLVITE) 1 MG tablet Take 1 mg by mouth daily.   hydrOXYzine (ATARAX) 10 MG tablet Take 10 mg by mouth 3 (three) times daily as needed for anxiety.   lamoTRIgine (LAMICTAL) 150 MG tablet Take 150 mg by mouth 2 (two) times daily.   metFORMIN (GLUCOPHAGE-XR) 500 MG 24 hr tablet Take 500 mg by mouth in the morning and at bedtime.   NYSTATIN powder Apply 2-3 application topically daily as needed (Stomach).   OXYTROL FOR WOMEN 3.9 MG/24HR Place 1 patch onto the skin every 3 (three) days.   pregabalin (LYRICA) 75 MG capsule Take 75 mg by mouth 3 (three) times daily.   propranolol (INDERAL) 10 MG tablet Take 10 mg by mouth 2 (two) times daily.   tirzepatide [facility]) 5 MG/0.5ML Pen Inject 5 mg into the skin every Sunday.   [DISCONTINUED] pantoprazole (PROTONIX) 40 MG tablet Take 40 mg by mouth daily.   No facility-administered encounter medications on file as of 02/26/2023.    ALLERGIES:  Allergies  Allergen Reactions   Soap Other (See Comments)    FRAGRANCES CAUSE MIGRAINES   Allevyn Adhesive [Wound Dressings]     Pt unsure of reaction    Aripiprazole     hallucination   Cipro [Ciprofloxacin Hcl] Diarrhea and Nausea And Vomiting   Ketorolac Tromethamine     confusion   Latuda [Lurasidone Hcl]     Confusion   Lurasidone     Other reaction(s): confusion   Mixed Grasses Hives    Sneeze    Pollen Extract     Headache    Prednisone     elevated blood glucose   Requip [Ropinirole]      confusion   Risperidone     hallucination   Sulfamethoxazole-Trimethoprim Hives and Itching   Vraylar [Cariprazine]     Hallucinations    Latex Itching   Sulfa Antibiotics Nausea Only and Rash   Tape  Rash    LABORATORY DATA:  I have reviewed the labs as listed.  CBC    Component Value Date/Time   WBC 6.6 02/18/2023 1050   RBC 3.91 02/18/2023 1050   HGB 11.6 (L) 02/18/2023 1050   HCT 35.4 (L) 02/18/2023 1050   PLT 188 02/18/2023 1050   MCV 90.5 02/18/2023 1050   MCH 29.7 02/18/2023 1050   MCHC 32.8 02/18/2023 1050   RDW 14.4 02/18/2023 1050   LYMPHSABS 1.3 02/18/2023 1050   MONOABS 0.4 02/18/2023 1050   EOSABS 0.2 02/18/2023 1050   BASOSABS 0.0 02/18/2023 1050      Latest Ref Rng & Units 02/18/2023   10:50 AM 01/06/2023    8:59 AM 08/22/2022   10:33 AM  CMP  Glucose 70 - 99 mg/dL 409  811  914   BUN 8 - 23 mg/dL 15  19  13    Creatinine 0.44 - 1.00 mg/dL 7.82  9.56  2.13   Sodium 135 - 145 mmol/L 137  136  135   Potassium 3.5 - 5.1 mmol/L 4.0  3.4  3.9   Chloride 98 - 111 mmol/L 102  100  101   CO2 22 - 32 mmol/L 22  26  24    Calcium 8.9 - 10.3 mg/dL 9.0  9.4  8.8   Total Protein 6.5 - 8.1 g/dL 6.4   7.0   Total Bilirubin 0.3 - 1.2 mg/dL 0.4   0.5   Alkaline Phos 38 - 126 U/L 75   99   AST 15 - 41 U/L 17   16   ALT 0 - 44 U/L 15   11     DIAGNOSTIC IMAGING:  I have independently reviewed the relevant imaging and discussed with the patient.   WRAP UP:  All questions were answered. The patient knows to call the clinic with any problems, questions or concerns.  Medical decision making: Moderate  Time spent on visit: I spent 20 minutes counseling the patient face to face. The total time spent in the appointment was 30 minutes and more than 50% was on counseling.  Carnella Guadalajara, PA-C  02/26/23 1:12 PM

## 2023-03-06 ENCOUNTER — Inpatient Hospital Stay: Payer: Medicare Other

## 2023-03-13 ENCOUNTER — Inpatient Hospital Stay: Payer: Medicare Other

## 2023-03-13 VITALS — BP 101/53 | HR 58 | Temp 98.1°F | Resp 17

## 2023-03-13 DIAGNOSIS — D500 Iron deficiency anemia secondary to blood loss (chronic): Secondary | ICD-10-CM

## 2023-03-13 DIAGNOSIS — D509 Iron deficiency anemia, unspecified: Secondary | ICD-10-CM | POA: Diagnosis not present

## 2023-03-13 MED ORDER — SODIUM CHLORIDE 0.9 % IV SOLN
510.0000 mg | Freq: Once | INTRAVENOUS | Status: AC
  Administered 2023-03-13: 510 mg via INTRAVENOUS
  Filled 2023-03-13: qty 510

## 2023-03-13 MED ORDER — CETIRIZINE HCL 10 MG PO TABS
10.0000 mg | ORAL_TABLET | Freq: Once | ORAL | Status: AC
  Administered 2023-03-13: 10 mg via ORAL
  Filled 2023-03-13: qty 1

## 2023-03-13 MED ORDER — SODIUM CHLORIDE 0.9 % IV SOLN: Freq: Once | INTRAVENOUS | Status: AC

## 2023-03-13 MED ORDER — ACETAMINOPHEN 325 MG PO TABS
650.0000 mg | ORAL_TABLET | Freq: Once | ORAL | Status: AC
  Administered 2023-03-13: 650 mg via ORAL
  Filled 2023-03-13: qty 2

## 2023-03-13 NOTE — Patient Instructions (Signed)
[facility]  Discharge Instructions: Thank you for choosing [facility] to provide your oncology and hematology care.  If you have a lab appointment with the Cancer Center - please note that after April 8th, 2024, all labs will be drawn in the cancer center.  You do not have to check in or register with the main entrance as you have in the past but will complete your check-in in the cancer center.  Wear comfortable clothing and clothing appropriate for easy access to any Portacath or PICC line.   We strive to give you quality time with your provider. You may need to reschedule your appointment if you arrive late (15 or more minutes).  Arriving late affects you and other patients whose appointments are after yours.  Also, if you miss three or more appointments without notifying the office, you may be dismissed from the clinic at the provider's discretion.      For prescription refill requests, have your pharmacy contact our office and allow 72 hours for refills to be completed.    Today you received the following:  Feraheme.  Ferumoxytol Injection What is this medication? FERUMOXYTOL (FER ue MOX i tol) treats low levels of iron in your body (iron deficiency anemia). Iron is a mineral that plays an important role in making red blood cells, which carry oxygen from your lungs to the rest of your body. This medicine may be used for other purposes; ask your health care provider or pharmacist if you have questions. COMMON BRAND NAME(S): Feraheme What should I tell my care team before I take this medication? They need to know if you have any of these conditions: Anemia not caused by low iron levels High levels of iron in the blood Magnetic resonance imaging (MRI) test scheduled An unusual or allergic reaction to iron, other medications, foods, dyes, or preservatives Pregnant or trying to get pregnant Breastfeeding How should I use this medication? This medication  is injected into a vein. It is given by your care team in a hospital or clinic setting. Talk to your care team the use of this medication in children. Special care may be needed. Overdosage: If you think you have taken too much of this medicine contact a poison control center or emergency room at once. NOTE: This medicine is only for you. Do not share this medicine with others. What if I miss a dose? It is important not to miss your dose. Call your care team if you are unable to keep an appointment. What may interact with this medication? Other iron products This list may not describe all possible interactions. Give your health care provider a list of all the medicines, herbs, non-prescription drugs, or dietary supplements you use. Also tell them if you smoke, drink alcohol, or use illegal drugs. Some items may interact with your medicine. What should I watch for while using this medication? Visit your care team regularly. Tell your care team if your symptoms do not start to get better or if they get worse. You may need blood work done while you are taking this medication. You may need to follow a special diet. Talk to your care team. Foods that contain iron include: whole grains/cereals, dried fruits, beans, or peas, leafy green vegetables, and organ meats (liver, kidney). What side effects may I notice from receiving this medication? Side effects that you should report to your care team as soon as possible: Allergic reactions--skin rash, itching, hives, swelling of the  face, lips, tongue, or throat Low blood pressure--dizziness, feeling faint or lightheaded, blurry vision Shortness of breath Side effects that usually do not require medical attention (report to your care team if they continue or are bothersome): Flushing Headache Joint pain Muscle pain Nausea Pain, redness, or irritation at injection site This list may not describe all possible side effects. Call your doctor for medical  advice about side effects. You may report side effects to FDA at 1-800-FDA-1088. Where should I keep my medication? This medication is given in a hospital or clinic. It will not be stored at home. NOTE: This sheet is a summary. It may not cover all possible information. If you have questions about this medicine, talk to your doctor, pharmacist, or health care provider.  2024 Elsevier/Gold Standard (2023-01-09 00:00:00)     To help prevent nausea and vomiting after your treatment, we encourage you to take your nausea medication as directed.  BELOW ARE SYMPTOMS THAT SHOULD BE REPORTED IMMEDIATELY: *FEVER GREATER THAN 100.4 F (38 C) OR HIGHER *CHILLS OR SWEATING *NAUSEA AND VOMITING THAT IS NOT CONTROLLED WITH YOUR NAUSEA MEDICATION *UNUSUAL SHORTNESS OF BREATH *UNUSUAL BRUISING OR BLEEDING *URINARY PROBLEMS (pain or burning when urinating, or frequent urination) *BOWEL PROBLEMS (unusual diarrhea, constipation, pain near the anus) TENDERNESS IN MOUTH AND THROAT WITH OR WITHOUT PRESENCE OF ULCERS (sore throat, sores in mouth, or a toothache) UNUSUAL RASH, SWELLING OR PAIN  UNUSUAL VAGINAL DISCHARGE OR ITCHING   Items with * indicate a potential emergency and should be followed up as soon as possible or go to the Emergency Department if any problems should occur.  Please show the CHEMOTHERAPY ALERT CARD or IMMUNOTHERAPY ALERT CARD at check-in to the Emergency Department and triage nurse.  Should you have questions after your visit or need to cancel or reschedule your appointment, please contact [facility] [phone]  and follow the prompts.  Office hours are 8:00 a.m. to 4:30 p.m. Monday - Friday. Please note that voicemails left after 4:00 p.m. may not be returned until the following business day.  We are closed weekends and major holidays. You have access to a nurse at all times for urgent questions. Please call the main number to the clinic [phone] and follow  the prompts.  For any non-urgent questions, you may also contact your provider using MyChart. We now offer e-Visits for anyone 100 and older to request care online for non-urgent symptoms. For details visit [email or web address].   Also download the MyChart app! Go to the app store, search "MyChart", open the app, select [facility], and log in with your MyChart username and password.

## 2023-03-13 NOTE — Progress Notes (Signed)
Patient presents today for iron infusion.  Patient is in satisfactory condition with no new complaints voiced.  Vital signs are stable.  IV placed in left AC.  IV flushed well with good blood return noted.  We will proceed with infusion per provider orders.    Patient tolerated iron infusion well with no complaints voiced.  Patient left via wheelchair in stable condition.  Vital signs stable at discharge.  Follow up as scheduled.

## 2023-03-17 ENCOUNTER — Telehealth: Payer: Self-pay

## 2023-03-17 NOTE — Telephone Encounter (Signed)
Documentation from [facility] pharmacy regarding the pt. The pt's last ov note and the result note from EGD was faxed to them in order for the pt to get reduced prescription pricing benefits . These notes were faxed to [phone]. Will advise the pt that this has ben done and that the Dexilant still not approved.

## 2023-03-20 ENCOUNTER — Inpatient Hospital Stay: Payer: Medicare Other

## 2023-03-26 MED FILL — Ferumoxytol Inj 510 MG/17ML (30 MG/ML) (Elemental Fe): INTRAVENOUS | Qty: 17 | Status: AC

## 2023-03-27 ENCOUNTER — Inpatient Hospital Stay: Payer: Medicare Other

## 2023-04-10 ENCOUNTER — Inpatient Hospital Stay: Payer: Medicare Other | Attending: Hematology

## 2023-04-10 VITALS — BP 107/46 | HR 66 | Temp 97.1°F | Resp 18

## 2023-04-10 DIAGNOSIS — Z79899 Other long term (current) drug therapy: Secondary | ICD-10-CM | POA: Diagnosis not present

## 2023-04-10 DIAGNOSIS — D500 Iron deficiency anemia secondary to blood loss (chronic): Secondary | ICD-10-CM

## 2023-04-10 DIAGNOSIS — D509 Iron deficiency anemia, unspecified: Secondary | ICD-10-CM | POA: Insufficient documentation

## 2023-04-10 MED ORDER — SODIUM CHLORIDE 0.9 % IV SOLN: Freq: Once | INTRAVENOUS | Status: AC

## 2023-04-10 MED ORDER — CETIRIZINE HCL 10 MG PO TABS
10.0000 mg | ORAL_TABLET | Freq: Once | ORAL | Status: AC
  Administered 2023-04-10: 10 mg via ORAL
  Filled 2023-04-10: qty 1

## 2023-04-10 MED ORDER — SODIUM CHLORIDE 0.9 % IV SOLN
510.0000 mg | Freq: Once | INTRAVENOUS | Status: AC
  Administered 2023-04-10: 510 mg via INTRAVENOUS
  Filled 2023-04-10: qty 510

## 2023-04-10 MED ORDER — ACETAMINOPHEN 325 MG PO TABS
650.0000 mg | ORAL_TABLET | Freq: Once | ORAL | Status: AC
  Administered 2023-04-10: 650 mg via ORAL
  Filled 2023-04-10: qty 2

## 2023-04-10 NOTE — Progress Notes (Signed)
Patient needed to leave to puck up husband from work in [place].  Patient tolerated iron infusion with no complaints voiced.  Peripheral IV site clean and dry with good blood return noted before and after infusion.  Band aid applied.  VSS with discharge and left in satisfactory condition with no s/s of distress noted.

## 2023-04-10 NOTE — Patient Instructions (Signed)

## 2023-04-14 ENCOUNTER — Telehealth: Payer: Self-pay | Admitting: *Deleted

## 2023-04-14 ENCOUNTER — Telehealth: Payer: Self-pay

## 2023-04-14 NOTE — Telephone Encounter (Signed)
Pt phoned and advised for the past 7 to 8 weeks every time she has a BM is bloody or either she is straight pooping water. She states she has had 2 iron infusions in the past 2 weeks. Pt states she is very weak. She called Hematology and they advised her to go to the ED but she refused. She states all the ER is going to do is send her here. Tobi Bastos please call and speak with this pt. She sounds like she is very weak

## 2023-04-14 NOTE — Telephone Encounter (Signed)
Patient called to report black tarry stools and blood in her stool since iron infusion on 04/10/23.  Requested that we bring her in for iron studies, as she is feeling very weak and unwell in general.  Advised that due to her symptoms, she needs to be evaluated in the ER.  She refused advise and asked if she could call her GI for recommendations.  Made her aware that would be appropriate, however if they are unable to get her in today she should report to ER.  Verbalized understanding.

## 2023-04-14 NOTE — Telephone Encounter (Signed)
Intermittent black stools, some blood. Has diarrhea. Feels like a "washed out" rag. Recent labs from mid August in [facility]. Declining ED.   She would like an appt.  Mandy: can we put her in with me tomorrow at 1030 IF that slot is still available? If not, see what else we can do this week. I don't have any openings THursday. Thanks!

## 2023-04-14 NOTE — Telephone Encounter (Signed)
noted 

## 2023-04-15 ENCOUNTER — Ambulatory Visit: Payer: Medicare Other | Admitting: Gastroenterology

## 2023-04-16 ENCOUNTER — Encounter: Payer: Medicare Other | Admitting: Gastroenterology

## 2023-08-26 ENCOUNTER — Inpatient Hospital Stay: Payer: Medicare Other

## 2023-09-01 ENCOUNTER — Inpatient Hospital Stay: Payer: Medicare Other | Attending: Hematology

## 2023-09-01 DIAGNOSIS — Z86000 Personal history of in-situ neoplasm of breast: Secondary | ICD-10-CM | POA: Insufficient documentation

## 2023-09-01 DIAGNOSIS — I4891 Unspecified atrial fibrillation: Secondary | ICD-10-CM | POA: Diagnosis not present

## 2023-09-01 DIAGNOSIS — Z8041 Family history of malignant neoplasm of ovary: Secondary | ICD-10-CM | POA: Diagnosis not present

## 2023-09-01 DIAGNOSIS — D500 Iron deficiency anemia secondary to blood loss (chronic): Secondary | ICD-10-CM | POA: Insufficient documentation

## 2023-09-01 DIAGNOSIS — K921 Melena: Secondary | ICD-10-CM | POA: Diagnosis not present

## 2023-09-01 DIAGNOSIS — N183 Chronic kidney disease, stage 3 unspecified: Secondary | ICD-10-CM | POA: Insufficient documentation

## 2023-09-01 DIAGNOSIS — E1122 Type 2 diabetes mellitus with diabetic chronic kidney disease: Secondary | ICD-10-CM | POA: Insufficient documentation

## 2023-09-01 DIAGNOSIS — Z7901 Long term (current) use of anticoagulants: Secondary | ICD-10-CM | POA: Diagnosis not present

## 2023-09-01 DIAGNOSIS — E559 Vitamin D deficiency, unspecified: Secondary | ICD-10-CM | POA: Diagnosis not present

## 2023-09-01 DIAGNOSIS — E538 Deficiency of other specified B group vitamins: Secondary | ICD-10-CM | POA: Insufficient documentation

## 2023-09-01 DIAGNOSIS — Z800 Family history of malignant neoplasm of digestive organs: Secondary | ICD-10-CM | POA: Diagnosis not present

## 2023-09-01 DIAGNOSIS — I129 Hypertensive chronic kidney disease with stage 1 through stage 4 chronic kidney disease, or unspecified chronic kidney disease: Secondary | ICD-10-CM | POA: Insufficient documentation

## 2023-09-01 LAB — IRON AND TIBC
Iron: 87 ug/dL (ref 28–170)
Saturation Ratios: 27 % (ref 10.4–31.8)
TIBC: 319 ug/dL (ref 250–450)
UIBC: 232 ug/dL

## 2023-09-01 LAB — CBC WITH DIFFERENTIAL/PLATELET
Abs Immature Granulocytes: 0.01 10*3/uL (ref 0.00–0.07)
Basophils Absolute: 0 10*3/uL (ref 0.0–0.1)
Basophils Relative: 1 %
Eosinophils Absolute: 0.2 10*3/uL (ref 0.0–0.5)
Eosinophils Relative: 3 %
HCT: 37.4 % (ref 36.0–46.0)
Hemoglobin: 12.7 g/dL (ref 12.0–15.0)
Immature Granulocytes: 0 %
Lymphocytes Relative: 29 %
Lymphs Abs: 1.5 10*3/uL (ref 0.7–4.0)
MCH: 30.7 pg (ref 26.0–34.0)
MCHC: 34 g/dL (ref 30.0–36.0)
MCV: 90.3 fL (ref 80.0–100.0)
Monocytes Absolute: 0.5 10*3/uL (ref 0.1–1.0)
Monocytes Relative: 10 %
Neutro Abs: 3 10*3/uL (ref 1.7–7.7)
Neutrophils Relative %: 57 %
Platelets: 233 10*3/uL (ref 150–400)
RBC: 4.14 MIL/uL (ref 3.87–5.11)
RDW: 14.4 % (ref 11.5–15.5)
WBC: 5.3 10*3/uL (ref 4.0–10.5)
nRBC: 0 % (ref 0.0–0.2)

## 2023-09-01 LAB — VITAMIN D 25 HYDROXY (VIT D DEFICIENCY, FRACTURES): Vit D, 25-Hydroxy: 43.84 ng/mL (ref 30–100)

## 2023-09-01 LAB — FERRITIN: Ferritin: 518 ng/mL — ABNORMAL HIGH (ref 11–307)

## 2023-09-01 NOTE — Progress Notes (Signed)
 VIRTUAL VISIT via TELEPHONE NOTE [facility]   I connected with Heidi [name]  on 09/02/23 at  11:00 AM by telephone and verified that I am speaking with the correct person using two identifiers.  Location: Patient: Home Provider: [facility]   I discussed the limitations, risks, security and privacy concerns of performing an evaluation and management service by telephone and the availability of in person appointments. I also discussed with the patient that there may be a patient responsible charge related to this service. The patient expressed understanding and agreed to proceed.  REASON FOR VISIT:  Follow-up for iron  deficiency anemia   CURRENT THERAPY: Intermittent IV iron  (most recent Feraheme  x 2 on 03/13/2023 and 04/10/2023)  INTERVAL HISTORY:  Heidi [name] is contacted today for follow-up of  iron  deficiency anemia.  She was last seen by Sheril Dines PA-C on 02/26/2023.  At today's visit, she reports feeling fair.  She has had some increased fatigue and worsening respiratory issues following COVID about 2 weeks ago.  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  She continues to follow with neurology due to gait instability and foot drop, and reports fall in December 2024.  She follows with nephrologist in [place] for CKD stage III.   She also reports that she has started seeing urologist for urinary incontinence and UTIs.  She reports diarrhea with some melanotic stool in August 2024, but has not noticed any melena or hematochezia since that time.  She reports fatigue, but denies any pica, headaches, chest pain, lightheadedness, or syncope.   She has 25% energy and 100% appetite.  She endorses that she is maintaining a stable weight.  ASSESSMENT & PLAN:  1.  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  However, gastroenterology  work-up thus far has not found specific source of bleeding. -- She has been on Eliquis since summer 2023 due to Afib - EGD on 05/15/2020 which showed gastritis and one gastric polyp. - Colonoscopy on 08/14/2020 showed nonbleeding internal hemorrhoids, diverticulosis, and one polyp. - Patient is unable to have capsule endoscopy due to implanted pacemaker.   - Work-up for other causes of anemia was unremarkable: Normal SPEP/IFE/free light chains, normal B12/MMA, folate, copper ; LDH normal; reticulocytes low at 2.2 likely hypoproliferation in the setting of iron  deficiency; erythropoietin  appropriately elevated at 119.4 - Unable to tolerate iron  pill due to severe constipation - Most recent IV Feraheme  on 03/13/2023 and 04/10/2023 - No rectal bleeding.  Intermittent black/sticky bowel movements, last noted in August 2024 - She continues to have severe chronic fatigue which is multifactorial - Most recent labs (09/01/2023): Hgb 12.7/MCV 90.3, ferritin 518, iron  saturation 27% - PLAN: No indication for IV iron  at this time. - Labs and OFFICE visit in 6 months    2.  History of right breast DCIS - Patient does have a history of stage 0 breast cancer, multicentric DCIS of the right breast, s/p right mastectomy.  Pathology showed no residual disease, ER and PR status could not be assessed. Patient was on norethindrone for abnormal uterine function at the time of her diagnosis.  She did not receive chemo or radiation; she was not placed on tamoxifen or anastrozole.  Her oncologist discharged her to PCP for annual breast exams and mammograms. - Most recent mammogram reportedly normal   - PLAN: Continue annual mammograms and breast  exams with PCP.    3.  Vitamin D  deficiency - Labs (12/20/2020) show decreased vitamin D  at 20.74 - Most recent vitamin D  (09/01/2023): Normal at 43.84 - She is taking vitamin D  2000 units daily - PLAN: Continue Vitamin D  2000 units daily - We will recheck vitamin D  at follow-up  visit in 6 months   4.  Vitamin B12 deficiency - She receives vitamin B12 injections monthly via self injection - Most recent labs (02/18/2023): Normal vitamin B12 416, normal MMA - PLAN: Continue monthly B12 injections.  We will recheck B12/MMA annually.   5.  Other history - PMH:  Other PMH includes type 2 diabetes mellitus, hypertension, tachybradycardia syndrome s/p pacemaker, Sjogren's disease, chronic pain syndrome (fibromyalgia and rheumatoid arthritis) on methadone , and other conditions noted elsewhere in medical record.  She is awaiting rotator cuff surgery of both shoulders.  She has chronic back pain and spinal stenosis.  She has sleep apnea, uses CPAP at home. - Social history: She lives at home with her husband.  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 She denies tobacco, alcohol, illicit drug use. - Family history:  Her family history is positive for paternal grandfather with colon cancer, paternal grandmother with ovarian cancer.   PLAN SUMMARY: >> Labs in 6 months = CBC/D, ferritin, iron /TIBC, vitamin D , B12, MMA >> OFFICE visit in 6 months, 1 week after labs     REVIEW OF SYSTEMS:   Review of Systems  Constitutional:  Positive for malaise/fatigue. Negative for chills, diaphoresis, fever and weight loss.  Respiratory:  Positive for cough (s/p COVID) and shortness of breath.   Cardiovascular:  Negative for chest pain and palpitations.  Gastrointestinal:  Negative for abdominal pain, blood in stool, melena, nausea and vomiting.  Genitourinary:  Positive for dysuria (UTIs, following with urology).  Neurological:  Positive for tingling. Negative for dizziness and headaches.     PHYSICAL EXAM: (per limitations of virtual telephone visit)  The patient is alert and oriented x 3, exhibiting adequate mentation, good mood, and ability to speak in full sentences and execute sound judgement.  WRAP UP:   I discussed the assessment and treatment  plan with the patient. The patient was provided an opportunity to ask questions and all were answered. The patient agreed with the plan and demonstrated an understanding of the instructions.   The patient was advised to call back or seek an in-person evaluation if the symptoms worsen or if the condition fails to improve as anticipated.  I provided 22 minutes of non-face-to-face time during this encounter, including >10 minutes of medical discussion.  Sonnie Dusky, PA-C 09/02/23 11:23 AM

## 2023-09-02 ENCOUNTER — Inpatient Hospital Stay: Payer: Medicare Other | Admitting: Physician Assistant

## 2023-09-02 VITALS — Wt 227.0 lb

## 2023-09-02 DIAGNOSIS — D500 Iron deficiency anemia secondary to blood loss (chronic): Secondary | ICD-10-CM | POA: Diagnosis not present

## 2023-09-02 DIAGNOSIS — E559 Vitamin D deficiency, unspecified: Secondary | ICD-10-CM

## 2023-09-02 DIAGNOSIS — E538 Deficiency of other specified B group vitamins: Secondary | ICD-10-CM | POA: Diagnosis not present

## 2024-01-26 ENCOUNTER — Other Ambulatory Visit: Payer: Self-pay

## 2024-01-26 ENCOUNTER — Telehealth: Payer: Self-pay

## 2024-01-26 DIAGNOSIS — K529 Noninfective gastroenteritis and colitis, unspecified: Secondary | ICD-10-CM

## 2024-01-26 NOTE — Telephone Encounter (Signed)
 Pt phoned ( her last ov was 12-02-2022) pt is having trouble controlling her bowels for a year or more. Sometimes BM's are up to 15 times a day. She states it watery and explosive. Last night she messed up her bed twice and today so far she has had 3 explosive diarrhea. They gave her an appt for July 2nd with you. Please advise

## 2024-01-26 NOTE — Telephone Encounter (Signed)
 She should be seen sooner if possible. In the interim, please order Cdiff and GI path panel.

## 2024-01-26 NOTE — Telephone Encounter (Signed)
 Phoned and advised the pt of labs at [facility] needing to be done and that the front office will be calling her for an appt sooner. Pt expressed understanding

## 2024-01-28 LAB — GI PROFILE, STOOL, PCR

## 2024-01-31 LAB — C DIFFICILE TOXINS A+B W/RFLX: C difficile Toxins A+B, EIA: NEGATIVE

## 2024-01-31 LAB — C DIFFICILE, CYTOTOXIN B

## 2024-02-01 ENCOUNTER — Other Ambulatory Visit: Payer: Self-pay | Admitting: *Deleted

## 2024-02-01 DIAGNOSIS — E559 Vitamin D deficiency, unspecified: Secondary | ICD-10-CM

## 2024-02-01 MED ORDER — VITAMIN D 50 MCG (2000 UT) PO CAPS: 2000.0000 [IU] | ORAL_CAPSULE | Freq: Every day | ORAL | 3 refills | Status: AC

## 2024-02-02 ENCOUNTER — Ambulatory Visit (INDEPENDENT_AMBULATORY_CARE_PROVIDER_SITE_OTHER): Admitting: Gastroenterology

## 2024-02-02 ENCOUNTER — Telehealth: Payer: Self-pay | Admitting: *Deleted

## 2024-02-02 VITALS — BP 100/67 | HR 63 | Temp 98.6°F | Ht 66.0 in | Wt 210.3 lb

## 2024-02-02 DIAGNOSIS — R197 Diarrhea, unspecified: Secondary | ICD-10-CM

## 2024-02-02 DIAGNOSIS — R1031 Right lower quadrant pain: Secondary | ICD-10-CM | POA: Diagnosis not present

## 2024-02-02 DIAGNOSIS — R634 Abnormal weight loss: Secondary | ICD-10-CM | POA: Diagnosis not present
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
## 2024-02-02 NOTE — Patient Instructions (Signed)
 Please have blood work done.  I am ordering a CT scan as soon as possible.  We are also arranging a colonoscopy in the near future. You will need to hold Eliquis 2 days before, and hold Mounjaro one week prior.   I have given you samples of Zenpep. Take 1 capsule with your meals (not before or after). We will see if this helps with stool.   Further recommendations to follow!  I enjoyed seeing you again today! I value our relationship and want to provide genuine, compassionate, and quality care. You may receive a survey regarding your visit with me, and I welcome your feedback! Thanks so much for taking the time to complete this. I look forward to seeing you again.      Delman Ferns, PhD, ANP-BC [facility]

## 2024-02-02 NOTE — Telephone Encounter (Signed)
  Request for patient to stop medication prior to procedure or is needing cleareance  02/02/24  Heidi [name] March 15, 1956  What type of surgery is being performed? Colonoscopy  When is surgery scheduled? TBD  What type of clearance is required (medical or pharmacy to hold medication or both? medication  Are there any medications that need to be held prior to surgery and how long? Eliquis x 2 days  Name of physician performing surgery?  Dr.Charles Chryl [facility] Phone: [phone] Fax: [phone]  Anethesia type (none, local, MAC, general)? MAC

## 2024-02-03 ENCOUNTER — Telehealth: Payer: Self-pay

## 2024-02-03 NOTE — Telephone Encounter (Signed)
 Heidi [name]  When pt was seen yesterday she failed to get her results from you regarding her stool test. She forgot to ask yesterday

## 2024-02-03 NOTE — Telephone Encounter (Signed)
 All negative

## 2024-02-04 NOTE — Telephone Encounter (Signed)
 Clearance scanned under media tab

## 2024-02-08 NOTE — Telephone Encounter (Signed)
Phoned the pt and LMOVM to return call 

## 2024-02-08 NOTE — Telephone Encounter (Signed)
 We can arrange colonoscopy in future and hold Eliquis X 2 days prior. However, we need to await CT results upcoming 6/26 before arranging colonoscopy.

## 2024-02-09 NOTE — Telephone Encounter (Signed)
 noted

## 2024-02-09 NOTE — Telephone Encounter (Signed)
Phoned the pt and LMOVM for the pt to return call 

## 2024-02-10 NOTE — Telephone Encounter (Signed)
 Returned the pt's call and LMOVM to return call / also left results on the phone

## 2024-02-11 ENCOUNTER — Ambulatory Visit (HOSPITAL_COMMUNITY)

## 2024-02-14 LAB — CBC WITH DIFFERENTIAL/PLATELET
Basophils Absolute: 0.1 10*3/uL (ref 0.0–0.2)
Basos: 1 %
EOS (ABSOLUTE): 0.2 10*3/uL (ref 0.0–0.4)
Eos: 2 %
Hematocrit: 42.9 % (ref 34.0–46.6)
Hemoglobin: 13.4 g/dL (ref 11.1–15.9)
Immature Grans (Abs): 0 10*3/uL (ref 0.0–0.1)
Immature Granulocytes: 0 %
Lymphocytes Absolute: 1.5 10*3/uL (ref 0.7–3.1)
Lymphs: 16 %
MCH: 29.1 pg (ref 26.6–33.0)
MCHC: 31.2 g/dL — ABNORMAL LOW (ref 31.5–35.7)
MCV: 93 fL (ref 79–97)
Monocytes Absolute: 0.6 10*3/uL (ref 0.1–0.9)
Monocytes: 6 %
Neutrophils Absolute: 7.5 10*3/uL — ABNORMAL HIGH (ref 1.4–7.0)
Neutrophils: 75 %
Platelets: 234 10*3/uL (ref 150–450)
RBC: 4.6 x10E6/uL (ref 3.77–5.28)
RDW: 13.1 % (ref 11.7–15.4)
WBC: 9.9 10*3/uL (ref 3.4–10.8)

## 2024-02-14 LAB — COMPREHENSIVE METABOLIC PANEL WITH GFR
ALT: 12 IU/L (ref 0–32)
AST: 9 IU/L (ref 0–40)
Albumin: 4.5 g/dL (ref 3.9–4.9)
Alkaline Phosphatase: 102 IU/L (ref 44–121)
BUN/Creatinine Ratio: 13 (ref 12–28)
BUN: 14 mg/dL (ref 8–27)
Bilirubin Total: 0.4 mg/dL (ref 0.0–1.2)
CO2: 19 mmol/L — ABNORMAL LOW (ref 20–29)
Calcium: 9.5 mg/dL (ref 8.7–10.3)
Chloride: 103 mmol/L (ref 96–106)
Creatinine, Ser: 1.11 mg/dL — ABNORMAL HIGH (ref 0.57–1.00)
Globulin, Total: 1.8 g/dL (ref 1.5–4.5)
Glucose: 103 mg/dL — ABNORMAL HIGH (ref 70–99)
Potassium: 4.7 mmol/L (ref 3.5–5.2)
Sodium: 140 mmol/L (ref 134–144)
Total Protein: 6.3 g/dL (ref 6.0–8.5)
eGFR: 55 mL/min/{1.73_m2} — ABNORMAL LOW (ref >59)

## 2024-02-14 LAB — CELIAC DISEASE PANEL
Endomysial IgA: NEGATIVE
IgA/Immunoglobulin A, Serum: 22 mg/dL — ABNORMAL LOW (ref 87–352)
Transglutaminase IgA: <2 U/mL (ref 0–3)

## 2024-02-14 LAB — TISSUE TRANSGLUTAMINASE, IGG: Tissue Transglut Ab: <2 U/mL (ref 0–5)

## 2024-02-14 LAB — C-REACTIVE PROTEIN: CRP: 5 mg/L (ref 0–10)

## 2024-02-14 LAB — SEDIMENTATION RATE: Sed Rate: 11 mm/h (ref 0–40)

## 2024-02-15 ENCOUNTER — Telehealth: Payer: Self-pay | Admitting: Gastroenterology

## 2024-02-15 NOTE — Telephone Encounter (Signed)
 Patient called and said to give you a message.... she did get the CT scan done at the ED in [place], [place] and if you have any questions about that she asked that you call her.

## 2024-02-16 NOTE — Telephone Encounter (Signed)
 Pt phoned and LM with front office if you needed to speak with her

## 2024-02-16 NOTE — Telephone Encounter (Signed)
 The CT she had completed was CT head.   She still needs CT abd/pelvis as recommended.   We can't pursue colonoscopy till this is done. Thanks!

## 2024-02-17 ENCOUNTER — Ambulatory Visit: Admitting: Gastroenterology

## 2024-02-18 NOTE — Telephone Encounter (Signed)
 Pt informed that CT has been rescheduled for Wednesday 03/02/24, arrive at 2:45 pm at [facility].

## 2024-02-18 NOTE — Telephone Encounter (Signed)
 noted

## 2024-02-23 ENCOUNTER — Inpatient Hospital Stay: Payer: Medicare Other

## 2024-02-24 ENCOUNTER — Ambulatory Visit: Payer: Self-pay | Admitting: Gastroenterology

## 2024-02-24 DIAGNOSIS — K529 Noninfective gastroenteritis and colitis, unspecified: Secondary | ICD-10-CM

## 2024-03-01 ENCOUNTER — Inpatient Hospital Stay: Payer: Medicare Other | Admitting: Physician Assistant

## 2024-03-02 ENCOUNTER — Ambulatory Visit (HOSPITAL_COMMUNITY)
Admission: RE | Admit: 2024-03-02 | Discharge: 2024-03-02 | Disposition: A | Discharge status: Home / Self Care | Source: Ambulatory Visit | Attending: Gastroenterology | Admitting: Gastroenterology

## 2024-03-02 DIAGNOSIS — R1031 Right lower quadrant pain: Secondary | ICD-10-CM | POA: Insufficient documentation

## 2024-03-02 MED ORDER — IOHEXOL 300 MG/ML  SOLN
100.0000 mL | Freq: Once | INTRAMUSCULAR | Status: AC | PRN
  Administered 2024-03-02: 100 mL via INTRAVENOUS

## 2024-03-07 ENCOUNTER — Telehealth: Payer: Self-pay

## 2024-03-07 MED ORDER — ZENPEP 60000-189600 UNITS PO CPEP: 1.0000 | ORAL_CAPSULE | Freq: Three times a day (TID) | ORAL | 3 refills | Status: AC

## 2024-03-07 NOTE — Telephone Encounter (Signed)
 Pt is out of Zenpep  60,000 and wants a Rx sent to [facility] in [place], [place] and some samples if she is to continue on this medication. Please advise

## 2024-03-07 NOTE — Telephone Encounter (Signed)
 Did she have improvement in diarrhea on Zenpep ? If so, we can continue this and provide samples. I have sent in prescription.

## 2024-03-07 NOTE — Addendum Note (Signed)
 Addended by: SHIRLEAN THERISA ORN on: 03/07/2024 01:56 PM   Modules accepted: Orders

## 2024-03-08 NOTE — Telephone Encounter (Signed)
 Already handled by Therisa Stager in another message (result note).

## 2024-03-10 ENCOUNTER — Encounter: Payer: Self-pay | Admitting: *Deleted

## 2024-03-10 ENCOUNTER — Other Ambulatory Visit: Payer: Self-pay | Admitting: *Deleted

## 2024-03-10 LAB — IGG: IgG (Immunoglobin G), Serum: 282 mg/dL — ABNORMAL LOW (ref 586–1602)

## 2024-03-10 LAB — TISSUE TRANSGLUTAMINASE, IGG: Tissue Transglut Ab: <2 U/mL (ref 0–5)

## 2024-03-10 MED ORDER — PEG 3350-KCL-NA BICARB-NACL 420 G PO SOLR: 4000.0000 mL | Freq: Once | ORAL | 0 refills | Status: AC

## 2024-03-21 NOTE — Progress Notes (Deleted)
 New Patient Pulmonology Office Visit   Subjective:  Patient ID: Heidi [name], female    DOB: 16-Mar-1956  MRN: [id number]  Referred by: Heidi [name],*  CC: No chief complaint on file.   HPI Heidi [name] is a 67 y.o. female with a PMH significant for HTN, DM, A. Fib, CKD stage III and severe persistent asthma who presents to establish care at our clinic.    {PULM QUESTIONNAIRES (Optional):33196}  ROS  Allergies: Soap, Allevyn adhesive [wound dressings], Aripiprazole, Cipro [ciprofloxacin hcl], Ketorolac tromethamine, Latuda [lurasidone hcl], Lurasidone, Mixed grasses, Pollen extract, Prednisone, Requip [ropinirole], Risperidone, Sulfamethoxazole-trimethoprim, Vraylar [cariprazine], Latex, Sulfa antibiotics, and Tape  Current Outpatient Medications:    ACCU-CHEK GUIDE test strip, See admin instructions., Disp: , Rfl:    albuterol (PROVENTIL) (2.5 MG/3ML) 0.083% nebulizer solution, Take 2.5 mg by nebulization every 6 (six) hours as needed for wheezing or shortness of breath., Disp: , Rfl:    Albuterol Sulfate (PROAIR RESPICLICK) 108 (90 Base) MCG/ACT AEPB, Inhale 1 puff into the lungs every 6 (six) hours as needed (shortness of breath or wheezing)., Disp: , Rfl:    allopurinol  (ZYLOPRIM ) 100 MG tablet, Take 100 mg by mouth 2 (two) times daily as needed (gout)., Disp: , Rfl:    ALPRAZolam (XANAX) 0.5 MG tablet, Take 0.5 mg by mouth 2 (two) times daily as needed for anxiety., Disp: , Rfl:    apixaban (ELIQUIS) 5 MG TABS tablet, Take 5 mg by mouth 2 (two) times daily., Disp: , Rfl:    B-D 3CC LUER-LOK SYR 25GX1 25G X 1 3 ML MISC, USE AS DIRECTED MONTHLY, Disp: , Rfl:    BD PEN NEEDLE MICRO U/F 32G X 6 MM MISC, Inject into the skin daily., Disp: , Rfl:    benazepril-hydrochlorthiazide (LOTENSIN HCT) 10-12.5 MG tablet, Take 1 tablet by mouth daily., Disp: , Rfl:    benztropine  (COGENTIN ) 0.5 MG tablet, Take 0.5 mg by mouth 2 (two) times daily as needed for tremors.  (Patient not taking: Reported on 02/02/2024), Disp: , Rfl:    Biotin 5 MG TABS, Take 5 mg by mouth daily., Disp: , Rfl:    BREZTRI AEROSPHERE 160-9-4.8 MCG/ACT AERO, Inhale 2 puffs into the lungs in the morning and at bedtime., Disp: , Rfl:    Cholecalciferol  (VITAMIN D ) 50 MCG (2000 UT) CAPS, Take 1 capsule (2,000 Units total) by mouth daily., Disp: 90 capsule, Rfl: 3   clotrimazole (LOTRIMIN) 1 % cream, Apply 1 Application topically 2 (two) times daily., Disp: , Rfl:    colchicine 0.6 MG tablet, Take 0.6 mg by mouth daily as needed (gout flare)., Disp: , Rfl:    cyanocobalamin  (,VITAMIN B-12,) 1000 MCG/ML injection, Inject 1,000 mcg into the muscle every 30 (thirty) days., Disp: , Rfl:    cyclobenzaprine (FLEXERIL) 5 MG tablet, Take 5 mg by mouth 3 (three) times daily as needed for pain., Disp: , Rfl:    DULoxetine  (CYMBALTA ) 60 MG capsule, Take 60 mg by mouth 2 (two) times daily., Disp: , Rfl:    folic acid  (FOLVITE ) 1 MG tablet, Take 1 mg by mouth daily., Disp: , Rfl:    hydrOXYzine (ATARAX) 10 MG tablet, Take 10 mg by mouth 3 (three) times daily as needed for anxiety., Disp: , Rfl:    lamoTRIgine  (LAMICTAL ) 150 MG tablet, Take 150 mg by mouth 2 (two) times daily., Disp: , Rfl:    metFORMIN (GLUCOPHAGE-XR) 500 MG 24 hr tablet, Take 500 mg by mouth in the morning and  at bedtime., Disp: , Rfl:    NYSTATIN powder, Apply 2-3 application topically daily as needed (Stomach)., Disp: , Rfl:    Pancrelipase , Lip-Prot-Amyl, (ZENPEP ) 60000-189600 units CPEP, Take 1 capsule by mouth with breakfast, with lunch, and with evening meal. Take 1 with high fat snacks. Up to 5 capsules a day., Disp: 150 capsule, Rfl: 3   pantoprazole  (PROTONIX ) 40 MG tablet, Take 40 mg by mouth 2 (two) times daily before a meal., Disp: , Rfl:    pregabalin  (LYRICA ) 75 MG capsule, Take 75 mg by mouth 3 (three) times daily., Disp: , Rfl:    propranolol (INDERAL) 10 MG tablet, Take 10 mg by mouth 2 (two) times daily., Disp: , Rfl:     tirzepatide (MOUNJARO) 5 MG/0.5ML Pen, Inject 5 mg into the skin every Sunday., Disp: , Rfl:  Past Medical History:  Diagnosis Date   Anemia    takes iron  supplement   Anxiety    Arthritis    Asthma    prn neb.   Atrial fib/flutter, transient (HCC)    Cancer (HCC)    right breast cancer   CHF (congestive heart failure) (HCC)    Chronic kidney disease    Stage3 Kidney Failure   DDD (degenerative disc disease), cervical    Depression    Diabetes mellitus    IDDM   Diverticulitis    Diverticulitis large intestine    Diverticulosis    Dysrhythmia    Environmental allergies    Fibromyalgia    GERD (gastroesophageal reflux disease)    Headache(784.0)    migraines   History of MRSA infection 08/2012   right breast   Hypertension    under control with med., has been on med. x 10-15 yr.   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   Past Surgical History:  Procedure Laterality Date   ANKLE ARTHROSCOPY Left    ANKLE FUSION Left    BACK SURGERY     BIOPSY  05/15/2020   Procedure: BIOPSY;  Surgeon: Cindie Carlin POUR, DO;  [facility];  Service: Endoscopy;;  gastric    BIOPSY  01/09/2023   Procedure: BIOPSY;  Surgeon: Cindie Carlin POUR, DO;  [facility]: [facility];  Service: Endoscopy;;   BREAST RECONSTRUCTION  05/24/2012   Procedure: BREAST RECONSTRUCTION;  Surgeon: Elna Pick, MD;  Location: [facility];  Service: Plastics;  Laterality: Right;  NO FLEX HD   BREAST RECONSTRUCTION Right 01/17/2013   Procedure: RIGHT BREAST RECONSTRUCTION WITH PLACEMENT OF TISSUE EXPANDER ;  Surgeon: Elna Pick, MD;  Location: [facility];  Service: Plastics;  Laterality: Right;    BREAST REDUCTION SURGERY Left 02/28/2013   Procedure: LEFT BREAST REDUCTION  (BREAST);  Surgeon: Elna Pick, MD;  Location:  SURGERY CENTER;  Service: Plastics;  Laterality: Left;   CESAREAN SECTION     CHOLECYSTECTOMY     COLONOSCOPY WITH PROPOFOL  N/A 08/14/2020   non-bleeding internal hemorrhoids, multiple small mouth diverticula, 5 mm polyp in descending colon, fair prep. Tubular adenoma. Surveillance in 5 years.    ESOPHAGOGASTRODUODENOSCOPY (EGD) WITH PROPOFOL  N/A 05/15/2020   gastritis, reactive gastropathy. negative H.pylori. one gastric polyp.  normal duodenum   ESOPHAGOGASTRODUODENOSCOPY (EGD) WITH PROPOFOL  N/A 01/09/2023   Procedure: ESOPHAGOGASTRODUODENOSCOPY (EGD) WITH PROPOFOL ;  Surgeon: Cindie Carlin POUR, DO;  [facility]: [facility];  Service: Endoscopy;  Laterality: N/A;  10:30 AM, ASA 3, pt knows to arrive at 6:15   INSERT / REPLACE / REMOVE PACEMAKER     MASTECTOMY Right 03/2012   NASAL SEPTUM SURGERY     POLYPECTOMY  05/15/2020   Procedure: POLYPECTOMY;  Surgeon: Cindie Carlin POUR, DO;  [facility];  Service: Endoscopy;;  antral   POLYPECTOMY  08/14/2020   Procedure: POLYPECTOMY;  Surgeon: Cindie Carlin POUR, DO;  [facility];  Service: Endoscopy;;   REMOVAL OF TISSUE EXPANDER AND PLACEMENT OF IMPLANT Right 05/10/2013   Procedure: RIGHT REMOVAL OF TISSUE EXPANDER ;  Surgeon: Elna Pick, MD;  Location: [facility];  Service: Plastics;  Laterality: Right;   REVERSE SHOULDER ARTHROPLASTY Right 03/07/2021   Procedure: REVERSE SHOULDER ARTHROPLASTY;  Surgeon: Dozier Soulier, MD;  Location: [facility];  Service: Orthopedics;  Laterality: Right;   SHOULDER ARTHROSCOPY Right    SHOULDER ARTHROSCOPY WITH SUBACROMIAL DECOMPRESSION Left 06/20/2021   Procedure: SHOULDER ARTHROSCOPY ROTATOR CUFF DEBRIDEMENT WITH SUBACROMIAL DECOMPRESSION, BICEPS TENOTOMY;  Surgeon: Dozier Soulier, MD;  Location: [facility];  Service: Orthopedics;   Laterality: Left;   TISSUE EXPANDER PLACEMENT  05/24/2012   Procedure: TISSUE EXPANDER;  Surgeon: Elna Pick, MD;  Location:  SUR[facility]ERY C[facility];  Service: Plastics;  Laterality: Right;   Family History  Problem Relation Age of Onset   Colon cancer Paternal Grandmother    Colon cancer Paternal Grandfather    Social History   Socioeconomic History   Marital status: Married    Spouse name: Not on file   Number of children: Not on file   Years of education: Not on file   Highest education level: Not on file  Occupational History   Not on file  Tobacco Use   Smoking status: Never   Smokeless tobacco: Never  Vaping Use   Vaping status: Never Used  Substance and Sexual Activity   Alcohol use: No   Drug use: No   Sexual activity: Not Currently  Other Topics Concern   Not on file  Social History Narrative   Not on file   Social Drivers of Health   Financial Resource Strain: Not on file  Food Insecurity: No Food Insecurity (12/20/2020)   Hunger Vital Sign    Worried About Running Out of Food in the Last Year: Never true    Ran Out of Food in the Last Year: Never true  Transportation Needs: No Transportation Needs (12/20/2020)   PRAPARE - Administrator, Civil Service (Medical): No    Lack of Transportation (Non-Medical): No  Physical Activity: Inactive (12/20/2020)   Exercise Vital Sign    Days of Exercise per Week: 0 days    Minutes of Exercise per Session: 0 min  Stress: No Stress Concern Present (12/20/2020)   [employer] of Occupational Health - Occupational Stress Questionnaire    Feeling of Stress : Only a little  Social Connections: Not on file  Intimate Partner Violence: Not At Risk (12/20/2020)   Humiliation, Afraid, Rape, and Kick questionnaire    Fear of Current or Ex-Partner: No    Emotionally Abused: No    Physically Abused: No    Sexually Abused: No       Objective:  LMP 05/20/2012  {Pulm Vitals (Optional):32837}  Physical  Exam  Diagnostic Review:  {Labs (Optional):32838}     Assessment & Plan:   Assessment & Plan   No orders of the defined types were placed in this encounter.     No follow-ups on file.   Berna Gitto, MD

## 2024-03-22 ENCOUNTER — Ambulatory Visit (HOSPITAL_BASED_OUTPATIENT_CLINIC_OR_DEPARTMENT_OTHER): Admitting: Pulmonary Disease

## 2024-03-22 ENCOUNTER — Telehealth: Payer: Self-pay | Admitting: Pulmonary Disease

## 2024-03-22 NOTE — Telephone Encounter (Signed)
 I called and spoke with the pt  She was scheduled for visit with Dr. Catherine 03/22/24 but missed her turn and ended up missing the appt  She woke up this am with hoarseness, sore throat, increased SOB  She denies any chest tightness, wheezing, fevers, aches  She denies any cough or sputum production She states she feels very fatigued  Using her breztri as directed and uses albuterol inhaler with little relief  She has albuterol nebs at home but has not used  She states she is already est with Dr. Catherine from his previous clinic and he is familiar with her  She wonders if he can call in something to help with her symptoms  Pred has helped in the past  Please advise, thanks so much!  Allergies  Allergen Reactions   Soap Other (See Comments)    FRAGRANCES CAUSE MIGRAINES   Allevyn Adhesive [Wound Dressings]     Pt unsure of reaction    Aripiprazole     hallucination   Cipro [Ciprofloxacin Hcl] Diarrhea and Nausea And Vomiting   Ketorolac Tromethamine     confusion   Latuda [Lurasidone Hcl]     Confusion   Lurasidone     Other reaction(s): confusion   Mixed Grasses Hives    Sneeze    Pollen Extract     Headache    Prednisone     elevated blood glucose   Requip [Ropinirole]      confusion   Risperidone     hallucination   Sulfamethoxazole-Trimethoprim Hives and Itching   Vraylar [Cariprazine]     Hallucinations    Latex Itching   Sulfa Antibiotics Nausea Only and Rash   Tape Rash

## 2024-03-22 NOTE — Telephone Encounter (Signed)
 Patient is present in office. She missed her appointment today but has been rescheduled and is requesting a call from Dr. Catherine. She is new to our clinic but has been with Dr. Catherine for years. Patient can be reached at [phone].

## 2024-03-22 NOTE — Telephone Encounter (Signed)
Called patient and left voice message

## 2024-03-23 ENCOUNTER — Telehealth: Payer: Self-pay | Admitting: Pulmonary Disease

## 2024-03-23 NOTE — Patient Instructions (Signed)
 Heidi [name]  03/23/2024     @PREFPERIOPPHARMACY @   Your procedure is scheduled on 03/28/2024.   Report to [facility] at  1015  A.M.   Call this number if you have problems the morning of surgery:  [phone]  If you experience any cold or flu symptoms such as cough, fever, chills, shortness of breath, etc. between now and your scheduled surgery, please notify us  at the above number.   Remember:       Your last dose of mounjaro should have been on 03/20/2024.       Your last dose of eliquis should be on 03/25/2024.        Use your inhaler and your nebulizer before you come and bring your rescue inhaler with you.          DO NOT take any medications for diabetes the morning of your procedure.   Follow the diet and prep instructions given to you by the office.    You may drink clear liquids until  0815 am on 03/28/2024.    lear liquids allowed are:                    Water , Juice (No red color; non-citric and without pulp; diabetics please choose diet or no sugar options), Carbonated beverages (diabetics please choose diet or no sugar options), Clear Tea (No creamer, milk, or cream, including half & half and powdered creamer), Black Coffee Only (No creamer, milk or cream, including half & half and powdered creamer), and Clear Sports drink (No red color; diabetics please choose diet or no sugar options)    Take these medicines the morning of surgery with A SIP OF WATER        allopurinol , alprazolam (if needed), cyclobenzaprine, duloxetine , lamotrigine , pantoprazole , pregabalin , propranolol.    Do not wear jewelry, make-up or nail polish, including gel polish,  artificial nails, or any other type of covering on natural nails (fingers and  toes).  Do not wear lotions, powders, or perfumes, or deodorant.  Do not shave 48 hours prior to surgery.  Men may shave face and neck.  Do not bring valuables to the hospital.  [facility] is not responsible for any belongings or  valuables.  Contacts, dentures or bridgework may not be worn into surgery.  Leave your suitcase in the car.  After surgery it may be brought to your room.  For patients admitted to the hospital, discharge time will be determined by your treatment team.  Patients discharged the day of surgery will not be allowed to drive home and must have someone with them for 24 hours.    Special instructions:   DO NOT smoke tobacco or vape for 24 hours before your procedure.  Please read over the following fact sheets that you were given. Anesthesia Post-op Instructions and Care and Recovery After Surgery      Colonoscopy, Adult, Care After The following information offers guidance on how to care for yourself after your procedure. Your health care provider may also give you more specific instructions. If you have problems or questions, contact your health care provider. What can I expect after the procedure? After the procedure, it is common to have: A small amount of blood in your stool for 24 hours after the procedure. Some gas. Mild cramping or bloating of your abdomen. Follow these instructions at home: Eating and drinking  Drink enough fluid to keep your urine pale yellow. Follow  instructions from your health care provider about eating or drinking restrictions. Resume your normal diet as told by your health care provider. Avoid heavy or fried foods that are hard to digest. Activity Rest as told by your health care provider. Avoid sitting for a long time without moving. Get up to take short walks every 1-2 hours. This is important to improve blood flow and breathing. Ask for help if you feel weak or unsteady. Return to your normal activities as told by your health care provider. Ask your health care provider what activities are safe for you. Managing cramping and bloating  Try walking around when you have cramps or feel bloated. If directed, apply heat to your abdomen as told by your health  care provider. Use the heat source that your health care provider recommends, such as a moist heat pack or a heating pad. Place a towel between your skin and the heat source. Leave the heat on for 20-30 minutes. Remove the heat if your skin turns bright red. This is especially important if you are unable to feel pain, heat, or cold. You have a greater risk of getting burned. General instructions If you were given a sedative during the procedure, it can affect you for several hours. Do not drive or operate machinery until your health care provider says that it is safe. For the first 24 hours after the procedure: Do not sign important documents. Do not drink alcohol. Do your regular daily activities at a slower pace than normal. Eat soft foods that are easy to digest. Take over-the-counter and prescription medicines only as told by your health care provider. Keep all follow-up visits. This is important. Contact a health care provider if: You have blood in your stool 2-3 days after the procedure. Get help right away if: You have more than a small spotting of blood in your stool. You have large blood clots in your stool. You have swelling of your abdomen. You have nausea or vomiting. You have a fever. You have increasing pain in your abdomen that is not relieved with medicine. These symptoms may be an emergency. Get help right away. Call 911. Do not wait to see if the symptoms will go away. Do not drive yourself to the hospital. Summary After the procedure, it is common to have a small amount of blood in your stool. You may also have mild cramping and bloating of your abdomen. If you were given a sedative during the procedure, it can affect you for several hours. Do not drive or operate machinery until your health care provider says that it is safe. Get help right away if you have a lot of blood in your stool, nausea or vomiting, a fever, or increased pain in your abdomen. This information  is not intended to replace advice given to you by your health care provider. Make sure you discuss any questions you have with your health care provider. Document Revised: 09/16/2022 Document Reviewed: 03/27/2021 Elsevier Patient Education  2024 Elsevier Inc.General Anesthesia, Adult, Care After The following information offers guidance on how to care for yourself after your procedure. Your health care provider may also give you more specific instructions. If you have problems or questions, contact your health care provider. What can I expect after the procedure? After the procedure, it is common for people to: Have pain or discomfort at the IV site. Have nausea or vomiting. Have a sore throat or hoarseness. Have trouble concentrating. Feel cold or chills. Feel weak, sleepy, or  tired (fatigue). Have soreness and body aches. These can affect parts of the body that were not involved in surgery. Follow these instructions at home: For the time period you were told by your health care provider:  Rest. Do not participate in activities where you could fall or become injured. Do not drive or use machinery. Do not drink alcohol. Do not take sleeping pills or medicines that cause drowsiness. Do not make important decisions or sign legal documents. Do not take care of children on your own. General instructions Drink enough fluid to keep your urine pale yellow. If you have sleep apnea, surgery and certain medicines can increase your risk for breathing problems. Follow instructions from your health care provider about wearing your sleep device: Anytime you are sleeping, including during daytime naps. While taking prescription pain medicines, sleeping medicines, or medicines that make you drowsy. Return to your normal activities as told by your health care provider. Ask your health care provider what activities are safe for you. Take over-the-counter and prescription medicines only as told by your  health care provider. Do not use any products that contain nicotine or tobacco. These products include cigarettes, chewing tobacco, and vaping devices, such as e-cigarettes. These can delay incision healing after surgery. If you need help quitting, ask your health care provider. Contact a health care provider if: You have nausea or vomiting that does not get better with medicine. You vomit every time you eat or drink. You have pain that does not get better with medicine. You cannot urinate or have bloody urine. You develop a skin rash. You have a fever. Get help right away if: You have trouble breathing. You have chest pain. You vomit blood. These symptoms may be an emergency. Get help right away. Call 911. Do not wait to see if the symptoms will go away. Do not drive yourself to the hospital. Summary After the procedure, it is common to have a sore throat, hoarseness, nausea, vomiting, or to feel weak, sleepy, or fatigue. For the time period you were told by your health care provider, do not drive or use machinery. Get help right away if you have difficulty breathing, have chest pain, or vomit blood. These symptoms may be an emergency. This information is not intended to replace advice given to you by your health care provider. Make sure you discuss any questions you have with your health care provider. Document Revised: 11/01/2021 Document Reviewed: 11/01/2021 Elsevier Patient Education  2024 [employer].

## 2024-03-23 NOTE — Telephone Encounter (Signed)
 Patient reports sore throat, scratchy voice. She is a little short of breath. No wheezing at this time. No fevers, chills, or night sweats. She has no dizziness. Coughing a lot. Phlegm a little bit. A little green. She has prednisone taper and Z-pack at home. Sounds like a viral URI. I counseled the patient on warning signs including dyspnea, wheezing, purulent sputum etc... I told her if any of these happen, she should start prednisone dose pack and Z pack. I advised her for now to use OTC cold/flu medications, lozenges, drink warm beverages etc..SABRA

## 2024-03-24 ENCOUNTER — Telehealth: Payer: Self-pay | Admitting: *Deleted

## 2024-03-24 ENCOUNTER — Encounter (HOSPITAL_COMMUNITY): Payer: Self-pay

## 2024-03-24 ENCOUNTER — Other Ambulatory Visit: Payer: Self-pay

## 2024-03-24 ENCOUNTER — Encounter (HOSPITAL_COMMUNITY)
Admission: RE | Admit: 2024-03-24 | Discharge: 2024-03-24 | Disposition: A | Discharge status: Home / Self Care | Source: Ambulatory Visit | Attending: Internal Medicine | Admitting: Internal Medicine

## 2024-03-24 VITALS — BP 92/58 | HR 67 | Resp 18 | Ht 66.0 in | Wt 210.3 lb

## 2024-03-24 DIAGNOSIS — E1169 Type 2 diabetes mellitus with other specified complication: Secondary | ICD-10-CM | POA: Diagnosis not present

## 2024-03-24 DIAGNOSIS — E785 Hyperlipidemia, unspecified: Secondary | ICD-10-CM | POA: Diagnosis not present

## 2024-03-24 DIAGNOSIS — Z01810 Encounter for preprocedural cardiovascular examination: Secondary | ICD-10-CM | POA: Diagnosis present

## 2024-03-24 NOTE — Telephone Encounter (Signed)
 Since Dr. Cindie and Therisa are both out, will send to [facility] to address.

## 2024-03-24 NOTE — Telephone Encounter (Signed)
 See other phone note from 03/23/24.

## 2024-03-24 NOTE — Telephone Encounter (Signed)
 Spoke with pt and she is aware of recs. She stated she will call back for an appointment once she is feeling better. Message sent to endo to cancel.
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
## 2024-03-28 ENCOUNTER — Ambulatory Visit (HOSPITAL_COMMUNITY): Admission: RE | Admit: 2024-03-28 | Source: Home / Self Care | Admitting: Internal Medicine

## 2024-03-28 ENCOUNTER — Encounter (HOSPITAL_COMMUNITY): Admission: RE | Payer: Self-pay | Source: Home / Self Care

## 2024-03-28 SURGERY — COLONOSCOPY
Anesthesia: Choice

## 2024-04-08 ENCOUNTER — Ambulatory Visit (HOSPITAL_BASED_OUTPATIENT_CLINIC_OR_DEPARTMENT_OTHER): Admitting: Pulmonary Disease

## 2024-04-19 ENCOUNTER — Inpatient Hospital Stay: Attending: Physician Assistant

## 2024-04-19 DIAGNOSIS — Z86000 Personal history of in-situ neoplasm of breast: Secondary | ICD-10-CM | POA: Insufficient documentation

## 2024-04-19 DIAGNOSIS — R5382 Chronic fatigue, unspecified: Secondary | ICD-10-CM | POA: Diagnosis not present

## 2024-04-19 DIAGNOSIS — Z7901 Long term (current) use of anticoagulants: Secondary | ICD-10-CM | POA: Insufficient documentation

## 2024-04-19 DIAGNOSIS — E559 Vitamin D deficiency, unspecified: Secondary | ICD-10-CM | POA: Insufficient documentation

## 2024-04-19 DIAGNOSIS — D500 Iron deficiency anemia secondary to blood loss (chronic): Secondary | ICD-10-CM | POA: Insufficient documentation

## 2024-04-19 DIAGNOSIS — I4891 Unspecified atrial fibrillation: Secondary | ICD-10-CM | POA: Diagnosis not present

## 2024-04-19 DIAGNOSIS — E538 Deficiency of other specified B group vitamins: Secondary | ICD-10-CM | POA: Diagnosis not present

## 2024-04-19 LAB — CBC WITH DIFFERENTIAL/PLATELET
Abs Immature Granulocytes: 0.02 K/uL (ref 0.00–0.07)
Basophils Absolute: 0 K/uL (ref 0.0–0.1)
Basophils Relative: 1 %
Eosinophils Absolute: 0.1 K/uL (ref 0.0–0.5)
Eosinophils Relative: 1 %
HCT: 39 % (ref 36.0–46.0)
Hemoglobin: 13.2 g/dL (ref 12.0–15.0)
Immature Granulocytes: 0 %
Lymphocytes Relative: 18 %
Lymphs Abs: 1.4 K/uL (ref 0.7–4.0)
MCH: 29.9 pg (ref 26.0–34.0)
MCHC: 33.8 g/dL (ref 30.0–36.0)
MCV: 88.2 fL (ref 80.0–100.0)
Monocytes Absolute: 0.5 K/uL (ref 0.1–1.0)
Monocytes Relative: 7 %
Neutro Abs: 5.8 K/uL (ref 1.7–7.7)
Neutrophils Relative %: 73 %
Platelets: 220 K/uL (ref 150–400)
RBC: 4.42 MIL/uL (ref 3.87–5.11)
RDW: 13.2 % (ref 11.5–15.5)
WBC: 7.8 K/uL (ref 4.0–10.5)
nRBC: 0 % (ref 0.0–0.2)

## 2024-04-19 LAB — VITAMIN B12: Vitamin B-12: 546 pg/mL (ref 180–914)

## 2024-04-19 LAB — IRON AND TIBC
Iron: 48 ug/dL (ref 28–170)
Saturation Ratios: 17 % (ref 10.4–31.8)
TIBC: 287 ug/dL (ref 250–450)
UIBC: 239 ug/dL

## 2024-04-19 LAB — VITAMIN D 25 HYDROXY (VIT D DEFICIENCY, FRACTURES): Vit D, 25-Hydroxy: 31.85 ng/mL (ref 30–100)

## 2024-04-19 LAB — FERRITIN: Ferritin: 219 ng/mL (ref 11–307)

## 2024-04-22 LAB — METHYLMALONIC ACID, SERUM: Methylmalonic Acid, Quantitative: 138 nmol/L (ref 0–378)
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
## 2024-04-25 ENCOUNTER — Inpatient Hospital Stay (HOSPITAL_BASED_OUTPATIENT_CLINIC_OR_DEPARTMENT_OTHER): Admitting: Physician Assistant

## 2024-04-25 VITALS — BP 132/77 | HR 62 | Temp 98.3°F | Resp 18

## 2024-04-25 DIAGNOSIS — E538 Deficiency of other specified B group vitamins: Secondary | ICD-10-CM | POA: Diagnosis not present

## 2024-04-25 DIAGNOSIS — E559 Vitamin D deficiency, unspecified: Secondary | ICD-10-CM | POA: Diagnosis not present

## 2024-04-25 DIAGNOSIS — D500 Iron deficiency anemia secondary to blood loss (chronic): Secondary | ICD-10-CM | POA: Diagnosis not present

## 2024-04-25 NOTE — Patient Instructions (Signed)
 [facility] at [facility] **VISIT SUMMARY & IMPORTANT INSTRUCTIONS **   You were seen today by Pleasant Barefoot PA-C for your follow-up visit.    IRON  DEFICIENCY ANEMIA Your blood and iron  levels look great! You do not need any IV iron  at this time. Your iron  deficiency in the past has most likely been due to to intermittent bleeding from somewhere in your intestines, although you may also have some difficulty absorbing nutrients from your stomach or intestines.  (This would be especially true if you end up being diagnosed with celiac disease)  OTHER VITAMIN DEFICIENCIES: Continue taking vitamin B12 injections once a month Continue taking vitamin D  2000 units (50 mcg) once daily.  You do not need any additional appointments in our office at this time.   I recommend that you follow-up with your primary care provider for monitoring of your blood counts, iron  levels, vitamin D , and vitamin B12.  These levels should be checked at least once a year. Your primary care provider can take over prescribing your vitamin B12 injections and vitamin D  supplement. If you need any additional IV iron  in the future, you can be referred back to our office as needed!  ** Thank you for trusting me with your healthcare!  I strive to provide all of my patients with quality care at each visit.  If you receive a survey for this visit, I would be so grateful to you for taking the time to provide feedback.  Thank you in advance!  ~ Jaedynn Bohlken                                                 Dr. Mickiel Davonna Pleasant Barefoot, PA-C     Delon Hope, NP   - - - - - - - - - - - - - - - - - -     Thank you for choosing [facility] at [facility] to provide your oncology and hematology care.  To afford each patient quality time with our provider, please arrive at least 15 minutes before your scheduled appointment time.   If you have a lab appointment with the Cancer  Center please come in thru the Main Entrance and check in at the main information desk.  You need to re-schedule your appointment should you arrive 10 or more minutes late.  We strive to give you quality time with our providers, and arriving late affects you and other patients whose appointments are after yours.  Also, if you no show three or more times for appointments you may be dismissed from the clinic at the providers discretion.     Again, thank you for choosing [facility].  Our hope is that these requests will decrease the amount of time that you wait before being seen by our physicians.       _____________________________________________________________  Should you have questions after your visit to [facility], please contact our office at [phone] and follow the prompts.  Our office hours are 8:00 a.m. and 4:30 p.m. Monday - Friday.  Please note that voicemails left after 4:00 p.m. may not be returned until the following business day.  We are closed weekends and major holidays.  You do have access to a nurse 24-7, just call the  main number to the clinic [phone] and do not press any options, hold on the line and a nurse will answer the phone.    For prescription refill requests, have your pharmacy contact our office and allow 72 hours.

## 2024-05-02 ENCOUNTER — Telehealth: Payer: Self-pay

## 2024-05-02 ENCOUNTER — Other Ambulatory Visit: Payer: Self-pay

## 2024-05-02 DIAGNOSIS — K219 Gastro-esophageal reflux disease without esophagitis: Secondary | ICD-10-CM

## 2024-05-02 DIAGNOSIS — R1310 Dysphagia, unspecified: Secondary | ICD-10-CM

## 2024-05-02 DIAGNOSIS — K2270 Barrett's esophagus without dysplasia: Secondary | ICD-10-CM

## 2024-05-02 MED ORDER — DEXLANSOPRAZOLE 60 MG PO CPDR: 60.0000 mg | DELAYED_RELEASE_CAPSULE | Freq: Every day | ORAL | 1 refills | Status: AC

## 2024-05-02 NOTE — Telephone Encounter (Signed)
 Rx for Dexilant  , Qty #90 with one additional refill sent to [facility] in [place]. Pt made aware of this

## 2024-05-02 NOTE — Telephone Encounter (Signed)
 Pt phoned and stated she phoned her insurance and spoke with them about getting Dexilant . It will cost her $75.00 and she is willing to pay for it. She has an appt with you on 05/11/2024 @ 9am. She states she doesn't want to wait that long to get Rx sent in. Please advise so I can call the pt back if you are willing to do this

## 2024-05-02 NOTE — Telephone Encounter (Signed)
 Yes, you can send in Dexilant  60 mg take one by mouth daily, dispense #90 with 1 refill.

## 2024-05-11 ENCOUNTER — Other Ambulatory Visit: Payer: Self-pay | Admitting: *Deleted

## 2024-05-11 ENCOUNTER — Ambulatory Visit (INDEPENDENT_AMBULATORY_CARE_PROVIDER_SITE_OTHER): Admitting: Gastroenterology

## 2024-05-11 ENCOUNTER — Encounter: Payer: Self-pay | Admitting: Gastroenterology

## 2024-05-11 VITALS — BP 113/75 | HR 71 | Temp 97.0°F | Ht 66.0 in | Wt 212.0 lb

## 2024-05-11 DIAGNOSIS — Z860100 Personal history of colon polyps, unspecified: Secondary | ICD-10-CM

## 2024-05-11 DIAGNOSIS — K2270 Barrett's esophagus without dysplasia: Secondary | ICD-10-CM

## 2024-05-11 DIAGNOSIS — Z8719 Personal history of other diseases of the digestive system: Secondary | ICD-10-CM

## 2024-05-11 DIAGNOSIS — Z87898 Personal history of other specified conditions: Secondary | ICD-10-CM

## 2024-05-11 DIAGNOSIS — Z860101 Personal history of adenomatous and serrated colon polyps: Secondary | ICD-10-CM

## 2024-05-11 DIAGNOSIS — K219 Gastro-esophageal reflux disease without esophagitis: Secondary | ICD-10-CM | POA: Diagnosis not present

## 2024-05-11 DIAGNOSIS — E1169 Type 2 diabetes mellitus with other specified complication: Secondary | ICD-10-CM

## 2024-05-11 DIAGNOSIS — R1310 Dysphagia, unspecified: Secondary | ICD-10-CM

## 2024-05-11 MED ORDER — ZENPEP 40000-126000 UNITS PO CPEP: ORAL_CAPSULE | ORAL | Status: AC

## 2024-05-11 NOTE — Patient Instructions (Signed)
 Please complete the blood work: this will actually need to be done at [facility] as the test is not available at [facility].  I recommend stopping Zenpep  60k. Instead, start the 40k with meals.  I am referring you to a nutritionist and ENT.  We will see you in 3 months!  Happy early birthday!  I enjoyed seeing you again today! I value our relationship and want to provide genuine, compassionate, and quality care. You may receive a survey regarding your visit with me, and I welcome your feedback! Thanks so much for taking the time to complete this. I look forward to seeing you again.      Therisa MICAEL Stager, PhD, ANP-BC [facility]
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
## 2024-05-12 ENCOUNTER — Encounter: Payer: Self-pay | Admitting: Oncology

## 2024-05-15 NOTE — Progress Notes (Unsigned)
 New Patient Pulmonology Office Visit   Subjective:  Patient ID: Heidi [name], female    DOB: 29-Jun-1956  MRN: [id number]  Referred by: Estelle Delon SQUIBB,*  CC: No chief complaint on file.   HPI Heidi [name] is a 68 y.o. female with HTN, HLD, T2DM, GERD, CKD stage 3, and severe persistent asthma who presents for initial evaluation.    {PULM QUESTIONNAIRES (Optional):33196}  ROS  Allergies: Soap, Allevyn adhesive [wound dressings], Aripiprazole, Aspirin, Cipro [ciprofloxacin hcl], Ketorolac tromethamine, Latuda [lurasidone hcl], Lurasidone, Mixed grasses, Pollen extract, Pravastatin, Prednisone, Requip [ropinirole], Risperidone, Ropinirole hcl, Sulfamethoxazole-trimethoprim, Vraylar [cariprazine], Latex, Sulfa antibiotics, and Tape  Current Outpatient Medications:    ACCU-CHEK GUIDE test strip, See admin instructions., Disp: , Rfl:    Albuterol Sulfate (PROAIR RESPICLICK) 108 (90 Base) MCG/ACT AEPB, Inhale 1 puff into the lungs every 6 (six) hours as needed (shortness of breath or wheezing)., Disp: , Rfl:    allopurinol  (ZYLOPRIM ) 100 MG tablet, Take 100 mg by mouth 2 (two) times daily as needed (gout)., Disp: , Rfl:    ALPRAZolam (XANAX) 0.5 MG tablet, Take 0.5 mg by mouth 2 (two) times daily as needed for anxiety., Disp: , Rfl:    apixaban (ELIQUIS) 5 MG TABS tablet, Take 5 mg by mouth 2 (two) times daily., Disp: , Rfl:    atorvastatin (LIPITOR) 10 MG tablet, 1 tablet Orally Once a day; Duration: 90 days, Disp: , Rfl:    B-D 3CC LUER-LOK SYR 25GX1 25G X 1 3 ML MISC, USE AS DIRECTED MONTHLY, Disp: , Rfl:    BD PEN NEEDLE MICRO U/F 32G X 6 MM MISC, Inject into the skin daily., Disp: , Rfl:    benazepril (LOTENSIN) 10 MG tablet, 1 tablet Orally Once a day; Duration: 90 days, Disp: , Rfl:    benztropine  (COGENTIN ) 0.5 MG tablet, Take 0.5 mg by mouth 2 (two) times daily as needed for tremors., Disp: , Rfl:    Biotin 5 MG TABS, Take 5 mg by mouth daily., Disp: , Rfl:     BREZTRI AEROSPHERE 160-9-4.8 MCG/ACT AERO, Inhale 2 puffs into the lungs in the morning and at bedtime., Disp: , Rfl:    busPIRone (BUSPAR) 10 MG tablet, Take 5-10 mg by mouth 3 (three) times daily as needed., Disp: , Rfl:    Cholecalciferol  (VITAMIN D ) 50 MCG (2000 UT) CAPS, Take 1 capsule (2,000 Units total) by mouth daily., Disp: 90 capsule, Rfl: 3   colchicine 0.6 MG tablet, Take 0.6 mg by mouth daily as needed (gout flare)., Disp: , Rfl:    cyanocobalamin  (,VITAMIN B-12,) 1000 MCG/ML injection, Inject 1,000 mcg into the muscle every 30 (thirty) days., Disp: , Rfl:    denosumab (PROLIA) 60 MG/ML SOSY injection, as directed Subcutaneous, Disp: , Rfl:    dexlansoprazole  (DEXILANT ) 60 MG capsule, 1 capsule., Disp: , Rfl:    dexlansoprazole  (DEXILANT ) 60 MG capsule, Take 1 capsule (60 mg total) by mouth daily., Disp: 90 capsule, Rfl: 1   DULoxetine  (CYMBALTA ) 60 MG capsule, Take 60 mg by mouth 2 (two) times daily., Disp: , Rfl:    folic acid  (FOLVITE ) 1 MG tablet, Take 1 mg by mouth daily., Disp: , Rfl:    hydrOXYzine (ATARAX) 10 MG tablet, Take 10 mg by mouth 3 (three) times daily as needed for anxiety., Disp: , Rfl:    lamoTRIgine  (LAMICTAL ) 150 MG tablet, Take 150 mg by mouth 2 (two) times daily., Disp: , Rfl:    leucovorin (WELLCOVORIN) 5 MG tablet,  1 tablet Orally Once a week; Duration: 30 days, Disp: , Rfl:    metFORMIN (GLUCOPHAGE-XR) 500 MG 24 hr tablet, Take 500 mg by mouth in the morning and at bedtime., Disp: , Rfl:    MOUNJARO 15 MG/0.5ML Pen, SMARTSIG:15 Milligram(s) SUB-Q Once a Week, Disp: , Rfl:    oxybutynin  (DITROPAN ) 5 MG tablet, 1 tablet Orally BID; Duration: 30 days, Disp: , Rfl:    Pancrelipase , Lip-Prot-Amyl, (ZENPEP ) 40000-126000 units CPEP, With meals, Disp: , Rfl:    Pancrelipase , Lip-Prot-Amyl, (ZENPEP ) 60000-189600 units CPEP, Take 1 capsule by mouth with breakfast, with lunch, and with evening meal. Take 1 with high fat snacks. Up to 5 capsules a day., Disp: 150 capsule,  Rfl: 3   pregabalin  (LYRICA ) 75 MG capsule, Take 75 mg by mouth 3 (three) times daily., Disp: , Rfl:    propranolol (INDERAL) 10 MG tablet, Take 10 mg by mouth 2 (two) times daily., Disp: , Rfl:    Vibegron (GEMTESA) 75 MG TABS, 1 tablet Orally Once a day, Disp: , Rfl:  Past Medical History:  Diagnosis Date   Anemia    takes iron  supplement   Anxiety    Arthritis    Asthma    prn neb.   Atrial fib/flutter, transient (HCC)    Cancer (HCC)    right breast cancer   CHF (congestive heart failure) (HCC)    Chronic kidney disease    Stage3 Kidney Failure   DDD (degenerative disc disease), cervical    Depression    Diabetes mellitus    IDDM   Diverticulitis    Diverticulitis large intestine    Diverticulosis    Dysrhythmia    Environmental allergies    Fibromyalgia    GERD (gastroesophageal reflux disease)    Headache(784.0)    migraines   History of MRSA infection 08/2012   right breast   Hypertension    under control with med., has been on med. x 10-15 yr.   Overactive bladder    Pneumonia    PONV (postoperative nausea and vomiting)    used scop patch 7/14-please use again per pt request   Presence of permanent cardiac pacemaker    Restless leg syndrome    Sjogren's disease    Sleep apnea    uses CPAP nightly   Ventilator associated pneumonia (HCC) 01/2022   Past Surgical History:  Procedure Laterality Date   ANKLE ARTHROSCOPY Left    ANKLE FUSION Left    BACK SURGERY     BIOPSY  05/15/2020   Procedure: BIOPSY;  Surgeon: Cindie Carlin POUR, DO;  [facility];  Service: Endoscopy;;  gastric    BIOPSY  01/09/2023   Procedure: BIOPSY;  Surgeon: Cindie Carlin POUR, DO;  [facility];  Service: Endoscopy;;   BREAST RECONSTRUCTION  05/24/2012   Procedure: BREAST RECONSTRUCTION;  Surgeon: Elna Pick, MD;  Location: [facility];  Service: Plastics;  Laterality: Right;  NO FLEX HD   BREAST RECONSTRUCTION Right 01/17/2013   Procedure:  RIGHT BREAST RECONSTRUCTION WITH PLACEMENT OF TISSUE EXPANDER ;  Surgeon: Elna Pick, MD;  Location: [facility];  Service: Plastics;  Laterality: Right;   BREAST REDUCTION SURGERY Left 02/28/2013   Procedure: LEFT BREAST REDUCTION  (BREAST);  Surgeon: Elna Pick, MD;  Location: [facility];  Service: Plastics;  Laterality: Left;   CESAREAN SECTION     CHOLECYSTECTOMY     COLONOSCOPY WITH PROPOFOL  N/A 08/14/2020   non-bleeding internal hemorrhoids,  multiple small mouth diverticula, 5 mm polyp in descending colon, fair prep. Tubular adenoma. Surveillance in 5 years.    ESOPHAGOGASTRODUODENOSCOPY (EGD) WITH PROPOFOL  N/A 05/15/2020   gastritis, reactive gastropathy. negative H.pylori. one gastric polyp. normal duodenum   ESOPHAGOGASTRODUODENOSCOPY (EGD) WITH PROPOFOL  N/A 01/09/2023   Procedure: ESOPHAGOGASTRODUODENOSCOPY (EGD) WITH PROPOFOL ;  Surgeon: Cindie Carlin POUR, DO;  [facility];  Service: Endoscopy;  Laterality: N/A;  10:30 AM, ASA 3, pt knows to arrive at 6:15   INSERT / REPLACE / REMOVE PACEMAKER     MASTECTOMY Right 03/2012   NASAL SEPTUM SURGERY     POLYPECTOMY  05/15/2020   Procedure: POLYPECTOMY;  Surgeon: Cindie Carlin POUR, DO;  [facility];  Service: Endoscopy;;  antral   POLYPECTOMY  08/14/2020   Procedure: POLYPECTOMY;  Surgeon: Cindie Carlin POUR, DO;  [facility];  Service: Endoscopy;;   REMOVAL OF TISSUE EXPANDER AND PLACEMENT OF IMPLANT Right 05/10/2013   Procedure: RIGHT REMOVAL OF TISSUE EXPANDER ;  Surgeon: Elna Pick, MD;  Location: [facility];  Service: Plastics;  Laterality: Right;   REVERSE SHOULDER ARTHROPLASTY Right 03/07/2021   Procedure: REVERSE SHOULDER ARTHROPLASTY;  Surgeon: Dozier Soulier, MD;  Location: [facility];  Service: Orthopedics;  Laterality: Right;   SHOULDER ARTHROSCOPY Right    SHOULDER ARTHROSCOPY WITH SUBACROMIAL DECOMPRESSION Left 06/20/2021    Procedure: SHOULDER ARTHROSCOPY ROTATOR CUFF DEBRIDEMENT WITH SUBACROMIAL DECOMPRESSION, BICEPS TENOTOMY;  Surgeon: Dozier Soulier, MD;  Location: [facility];  Service: Orthopedics;  Laterality: Left;   TISSUE EXPANDER PLACEMENT  05/24/2012   Procedure: TISSUE EXPANDER;  Surgeon: Elna Pick, MD;  Location: [facility];  Service: Plastics;  Laterality: Right;   Family History  Problem Relation Age of Onset   Colon cancer Paternal Grandmother    Colon cancer Paternal Grandfather    Social History   Socioeconomic History   Marital status: Married    Spouse name: Not on file   Number of children: Not on file   Years of education: Not on file   Highest education level: Not on file  Occupational History   Not on file  Tobacco Use   Smoking status: Never   Smokeless tobacco: Never  Vaping Use   Vaping status: Never Used  Substance and Sexual Activity   Alcohol use: No   Drug use: No   Sexual activity: Not Currently  Other Topics Concern   Not on file  Social History Narrative   Not on file   Social Drivers of Health   Financial Resource Strain: Not on file  Food Insecurity: No Food Insecurity (12/20/2020)   Hunger Vital Sign    Worried About Running Out of Food in the Last Year: Never true    Ran Out of Food in the Last Year: Never true  Transportation Needs: No Transportation Needs (12/20/2020)   PRAPARE - Administrator, Civil Service (Medical): No    Lack of Transportation (Non-Medical): No  Physical Activity: Inactive (12/20/2020)   Exercise Vital Sign    Days of Exercise per Week: 0 days    Minutes of Exercise per Session: 0 min  Stress: No Stress Concern Present (12/20/2020)   [employer] of Occupational Health - Occupational Stress Questionnaire    Feeling of Stress : Only a little  Social Connections: Not on file  Intimate Partner Violence: Not At Risk (12/20/2020)   Humiliation, Afraid, Rape, and Kick questionnaire    Fear of Current or  Ex-Partner:  No    Emotionally Abused: No    Physically Abused: No    Sexually Abused: No       Objective:  LMP 05/20/2012  {Pulm Vitals (Optional):32837}  Physical Exam  Diagnostic Review:  {Labs (Optional):32838}  AEC 100 - 300 cells/uL     Assessment & Plan:   Assessment & Plan   No orders of the defined types were placed in this encounter.     No follow-ups on file.   Donnavin Vandenbrink, MD

## 2024-05-16 ENCOUNTER — Encounter: Payer: Self-pay | Admitting: Pulmonary Disease

## 2024-05-16 ENCOUNTER — Institutional Professional Consult (permissible substitution) (INDEPENDENT_AMBULATORY_CARE_PROVIDER_SITE_OTHER)

## 2024-05-16 ENCOUNTER — Ambulatory Visit (INDEPENDENT_AMBULATORY_CARE_PROVIDER_SITE_OTHER): Admitting: Pulmonary Disease

## 2024-05-16 VITALS — BP 131/63 | HR 71 | Ht 66.0 in | Wt 215.0 lb

## 2024-05-16 DIAGNOSIS — G4733 Obstructive sleep apnea (adult) (pediatric): Secondary | ICD-10-CM

## 2024-05-16 DIAGNOSIS — J4550 Severe persistent asthma, uncomplicated: Secondary | ICD-10-CM

## 2024-05-16 MED ORDER — FLUCONAZOLE 150 MG PO TABS: 150.0000 mg | ORAL_TABLET | Freq: Every day | ORAL | 0 refills | Status: DC

## 2024-05-16 MED ORDER — PREDNISONE 10 MG (21) PO TBPK: ORAL_TABLET | Freq: Every day | ORAL | 1 refills | Status: AC

## 2024-05-16 MED ORDER — AZITHROMYCIN 250 MG PO TABS: 250.0000 mg | ORAL_TABLET | Freq: Every day | ORAL | 0 refills | Status: AC

## 2024-05-16 NOTE — Patient Instructions (Signed)
-   I sent in a referral to Dr. Emilio for Dental appliance. - Continue with the Breztri - I'm fine with you seeing ENT for hoarseness - I'll see you around christmass time

## 2024-05-23 ENCOUNTER — Ambulatory Visit (INDEPENDENT_AMBULATORY_CARE_PROVIDER_SITE_OTHER)

## 2024-05-23 VITALS — BP 139/83 | HR 64 | Temp 97.9°F | Ht 66.0 in | Wt 213.0 lb

## 2024-05-23 DIAGNOSIS — J383 Other diseases of vocal cords: Secondary | ICD-10-CM | POA: Diagnosis not present

## 2024-05-23 DIAGNOSIS — R490 Dysphonia: Secondary | ICD-10-CM | POA: Diagnosis not present

## 2024-05-23 DIAGNOSIS — R1310 Dysphagia, unspecified: Secondary | ICD-10-CM | POA: Diagnosis not present

## 2024-05-23 DIAGNOSIS — L299 Pruritus, unspecified: Secondary | ICD-10-CM | POA: Diagnosis not present

## 2024-05-23 DIAGNOSIS — H608X3 Other otitis externa, bilateral: Secondary | ICD-10-CM | POA: Diagnosis not present

## 2024-05-23 MED ORDER — FLUOCINOLONE ACETONIDE 0.01 % OT OIL: 2.0000 [drp] | TOPICAL_OIL | Freq: Two times a day (BID) | OTIC | 2 refills | Status: AC

## 2024-05-23 NOTE — Progress Notes (Signed)
 Dear Dr. Estelle, Here is my assessment for our mutual patient, Heidi [name]. Thank you for allowing me the opportunity to care for your patient. Please do not hesitate to contact me should you have any other questions. Sincerely, Dr. Hadassah Parody  Otolaryngology Clinic Note Referring provider: Dr. Estelle HPI:   Discussed the use of AI scribe software for clinical note transcription with the patient, who gave verbal consent to proceed.  Heidi [name] is a 68 year old female who presents with persistent hoarseness and voice changes.   Dysphonia and voice changes - Persistent hoarseness and voice changes since 2022 after hospitalization for respiratory distress. Reports being intubated 3 times. - Voice issues have persisted since that time - Voice described as 'raspy,' 'breathy,' and deeper in tone, especially noticeable over the phone - Hoarseness is intermittent, with some days better than others - No throat pain - Able to complete full sentences without needing to pause for breath - No change in breathing or difficulty breathing   Swallowing function - Has undergone swallowing tests in the past - reported that she had some swallowing difficulty after her hospitalization. Was seen by Dr. Mirna Cohens at [facility] who recommended MBS for further characterization of swallow. Pt reports she had this done elsewhere and does not currently have concerns about her swallowing.  - She eats slowly and watches what she eats. - No history of aspiration pneumonias recently   Otologic symptoms - History of ear itching - Previously used ear drops prescribed by an ENT physician (now retired) - Investment banker, operational with Q-tips  Surgical history - Tonsillectomy at age 56   Pt also has IDA and gets IV iron . Has    Independent Review of Additional Tests or Records:  Note by Mirna Cohens, DO (07/01/22): FEES showed penetration with thin and pureed. Recommended MBS, swallowing therapy  and potentially voice therapy at follow-up.   PMH/Meds/All/SocHx/FamHx/ROS:   Past Medical History:  Diagnosis Date   Anemia    takes iron  supplement   Anxiety    Arthritis    Asthma    prn neb.   Atrial fib/flutter, transient (HCC)    Cancer (HCC)    right breast cancer   CHF (congestive heart failure) (HCC)    Chronic kidney disease    Stage3 Kidney Failure   DDD (degenerative disc disease), cervical    Depression    Diabetes mellitus    IDDM   Diverticulitis    Diverticulitis large intestine    Diverticulosis    Dysrhythmia    Environmental allergies    Fibromyalgia    GERD (gastroesophageal reflux disease)    Headache(784.0)    migraines   History of MRSA infection 08/2012   right breast   Hypertension    under control with med., has been on med. x 10-15 yr.   Overactive bladder    Pneumonia    PONV (postoperative nausea and vomiting)    used scop patch 7/14-please use again per pt request   Presence of permanent cardiac pacemaker    Restless leg syndrome    Sjogren's disease    Sleep apnea    uses CPAP nightly   Ventilator associated pneumonia (HCC) 01/2022     Past Surgical History:  Procedure Laterality Date   ANKLE ARTHROSCOPY Left    ANKLE FUSION Left    BACK SURGERY     BIOPSY  05/15/2020   Procedure: BIOPSY;  Surgeon: Cindie Carlin POUR, DO;  [facility];  Service: Endoscopy;;  gastric    BIOPSY  01/09/2023   Procedure: BIOPSY;  Surgeon: Cindie Carlin POUR, DO;  [facility];  Service: Endoscopy;;   BREAST RECONSTRUCTION  05/24/2012   Procedure: BREAST RECONSTRUCTION;  Surgeon: Elna Pick, MD;  Location: [facility];  Service: Plastics;  Laterality: Right;  NO FLEX HD   BREAST RECONSTRUCTION Right 01/17/2013   Procedure: RIGHT BREAST RECONSTRUCTION WITH PLACEMENT OF TISSUE EXPANDER ;  Surgeon: Elna Pick, MD;  Location: [facility];  Service: Plastics;  Laterality: Right;   BREAST  REDUCTION SURGERY Left 02/28/2013   Procedure: LEFT BREAST REDUCTION  (BREAST);  Surgeon: Elna Pick, MD;  Location: [facility];  Service: Plastics;  Laterality: Left;   CESAREAN SECTION     CHOLECYSTECTOMY     COLONOSCOPY WITH PROPOFOL  N/A 08/14/2020   non-bleeding internal hemorrhoids, multiple small mouth diverticula, 5 mm polyp in descending colon, fair prep. Tubular adenoma. Surveillance in 5 years.    ESOPHAGOGASTRODUODENOSCOPY (EGD) WITH PROPOFOL  N/A 05/15/2020   gastritis, reactive gastropathy. negative H.pylori. one gastric polyp. normal duodenum   ESOPHAGOGASTRODUODENOSCOPY (EGD) WITH PROPOFOL  N/A 01/09/2023   Procedure: ESOPHAGOGASTRODUODENOSCOPY (EGD) WITH PROPOFOL ;  Surgeon: Cindie Carlin POUR, DO;  [facility];  Service: Endoscopy;  Laterality: N/A;  10:30 AM, ASA 3, pt knows to arrive at 6:15   INSERT / REPLACE / REMOVE PACEMAKER     MASTECTOMY Right 03/2012   NASAL SEPTUM SURGERY     POLYPECTOMY  05/15/2020   Procedure: POLYPECTOMY;  Surgeon: Cindie Carlin POUR, DO;  [facility];  Service: Endoscopy;;  antral   POLYPECTOMY  08/14/2020   Procedure: POLYPECTOMY;  Surgeon: Cindie Carlin POUR, DO;  [facility];  Service: Endoscopy;;   REMOVAL OF TISSUE EXPANDER AND PLACEMENT OF IMPLANT Right 05/10/2013   Procedure: RIGHT REMOVAL OF TISSUE EXPANDER ;  Surgeon: Elna Pick, MD;  Location: [facility];  Service: Plastics;  Laterality: Right;   REVERSE SHOULDER ARTHROPLASTY Right 03/07/2021   Procedure: REVERSE SHOULDER ARTHROPLASTY;  Surgeon: Dozier Soulier, MD;  [facility];  Service: Orthopedics;  Laterality: Right;   SHOULDER ARTHROSCOPY Right    SHOULDER ARTHROSCOPY WITH SUBACROMIAL DECOMPRESSION Left 06/20/2021   Procedure: SHOULDER ARTHROSCOPY ROTATOR CUFF DEBRIDEMENT WITH SUBACROMIAL DECOMPRESSION, BICEPS TENOTOMY;  Surgeon: Dozier Soulier, MD;  Location: [facility];  Service: Orthopedics;  Laterality:  Left;   TISSUE EXPANDER PLACEMENT  05/24/2012   Procedure: TISSUE EXPANDER;  Surgeon: Elna Pick, MD;  Location: [facility];  Service: Plastics;  Laterality: Right;    Family History  Problem Relation Age of Onset   Colon cancer Paternal Grandmother    Colon cancer Paternal Grandfather      Social Connections: Not on file     Current Outpatient Medications  Medication Instructions   ACCU-CHEK GUIDE test strip See admin instructions   Albuterol Sulfate (PROAIR RESPICLICK) 108 (90 Base) MCG/ACT AEPB 1 puff, Every 6 hours PRN   allopurinol  (ZYLOPRIM ) 100 mg, 2 times daily PRN   ALPRAZolam (XANAX) 0.5 mg, 2 times daily PRN   apixaban (ELIQUIS) 5 mg, 2 times daily   atorvastatin (LIPITOR) 10 MG tablet 1 tablet Orally Once a day; Duration: 90 days   azithromycin (ZITHROMAX) 250 mg, Oral, Daily   B-D 3CC LUER-LOK SYR 25GX1 25G X 1 3 ML MISC USE AS DIRECTED MONTHLY   BD PEN NEEDLE MICRO U/F 32G X 6 MM MISC Daily   benazepril (LOTENSIN) 10  MG tablet 1 tablet Orally Once a day; Duration: 90 days   benztropine  (COGENTIN ) 0.5 mg, 2 times daily PRN   Biotin 5 mg, Daily   BREZTRI AEROSPHERE 160-9-4.8 MCG/ACT AERO 2 puffs, 2 times daily   busPIRone (BUSPAR) 5-10 mg, 3 times daily PRN   colchicine 0.6 mg, Daily PRN   cyanocobalamin  (VITAMIN B12) 1,000 mcg, Every 30 days   denosumab (PROLIA) 60 MG/ML SOSY injection as directed Subcutaneous   dexlansoprazole  (DEXILANT ) 60 MG capsule 1 capsule   dexlansoprazole  (DEXILANT ) 60 mg, Oral, Daily   DULoxetine  (CYMBALTA ) 60 mg, 2 times daily   fluconazole (DIFLUCAN) 150 mg, Oral, Daily   folic acid  (FOLVITE ) 1 mg, Daily   hydrOXYzine (ATARAX) 10 mg, 3 times daily PRN   lamoTRIgine  (LAMICTAL ) 150 mg, 2 times daily   leucovorin (WELLCOVORIN) 5 MG tablet 1 tablet Orally Once a week; Duration: 30 days   metFORMIN (GLUCOPHAGE-XR) 500 mg, 2 times daily   MOUNJARO 15 MG/0.5ML Pen SMARTSIG:15 Milligram(s) SUB-Q Once a Week    oxybutynin  (DITROPAN ) 5 MG tablet 1 tablet Orally BID; Duration: 30 days   Pancrelipase , Lip-Prot-Amyl, (ZENPEP ) 40000-126000 units CPEP With meals   Pancrelipase , Lip-Prot-Amyl, (ZENPEP ) 60000-189600 units CPEP 1 capsule, Oral, 3 times daily with meals, Take 1 with high fat snacks. Up to 5 capsules a day.   predniSONE (STERAPRED UNI-PAK 21 TAB) 10 MG (21) TBPK tablet Oral, Daily   pregabalin  (LYRICA ) 75 mg, 3 times daily   propranolol (INDERAL) 10 mg, 2 times daily   Vibegron (GEMTESA) 75 MG TABS 1 tablet Orally Once a day   Vitamin D  2,000 Units, Oral, Daily     Physical Exam:   Ht 5' 6 (1.676 m)   Wt 213 lb (96.6 kg)   LMP 05/20/2012   BMI 34.38 kg/m   Salient findings:  CN II-XII intact *** Bilateral EAC clear and TM intact with well pneumatized middle ear spaces Weber 512: *** Rinne 512: AC > BC b/l *** Rine 1024: AC > BC b/l *** TFL was indicated to better evaluate the proximal airway, given the patient's history and exam findings, and is detailed below. Anterior rhinoscopy: Septum ***; bilateral inferior turbinates with *** No lesions of oral cavity/oropharynx; dentition *** No obviously palpable neck masses/lymphadenopathy/thyromegaly No respiratory distress or stridor***  Seprately Identifiable Procedures:  Prior to initiating any procedures, risks/benefits/alternatives were explained to the patient and verbal consent obtained.  Procedure Note (05/23/2024) Pre-procedure diagnosis:  Dysphonia  Post-procedure diagnosis: Same Procedure: Transnasal Fiberoptic Laryngoscopy, CPT 31575 - Mod 25 Indication: dysphonia Complications: None apparent EBL: 0 mL  The procedure was undertaken to further evaluate the patient's complaint of dysphonia, with mirror exam inadequate for appropriate examination due to gag reflex and poor patient tolerance  Procedure:  Patient was identified as correct patient. Verbal consent was obtained. The nose was sprayed with oxymetazoline and 4%  lidocaine . The The flexible laryngoscope was passed through the nose to view the nasal cavity, pharynx (oropharynx, hypopharynx) and larynx.  The larynx was examined at rest and during multiple phonatory tasks. Documentation was obtained and reviewed with patient. The scope was removed. The patient tolerated the procedure well.  Findings: The nasal cavity and nasopharynx did not reveal any masses or lesions, mucosa appeared to be without obvious lesions. The tongue base, pharyngeal walls, piriform sinuses, vallecula, epiglottis and postcricoid region are normal in appearance EXCEPT: modest thinning and bowing of the VF . The visualized portion of the subglottis and proximal trachea is widely patent.  The vocal folds are mobile bilaterally. There are no lesions on the free edge of the vocal folds nor elsewhere in the larynx worrisome for malignancy.      Impression & Plans:  Heidi [name] is a 68 y.o. female with ***  No diagnosis found.   - f/u ***  See below regarding exact medications prescribed this encounter including dosages and route: No orders of the defined types were placed in this encounter.     Thank you for allowing me the opportunity to care for your patient. Please do not hesitate to contact me should you have any other questions.  Sincerely, Hadassah Parody, MD Otolaryngologist (ENT), [facility] Phone: [phone] Fax: [phone]  05/23/2024, 1:18 PM   MDM:  Level *** Complexity/Problems addressed: *** Data complexity: *** independent review of *** - Morbidity: ***  - Prescription Drug prescribed or managed: ***

## 2024-05-24 ENCOUNTER — Telehealth: Payer: Self-pay

## 2024-05-24 NOTE — Telephone Encounter (Signed)
 Pt phoned yesterday LMOVM advising she wanted to discuss labs with you. She had seen in her MyChart but wanted to speak with you regarding them

## 2024-05-27 LAB — CELIAC DISEASE HLA DQ ASSOC.
DQ2 (DQA1 0501/0505,DQB1 02XX): NEGATIVE
DQ8 (DQA1 03XX,DQB1 0302): NEGATIVE

## 2024-06-01 ENCOUNTER — Ambulatory Visit: Payer: Self-pay | Admitting: Gastroenterology

## 2024-06-01 NOTE — Telephone Encounter (Signed)
 Genetic testing for celiac disease is negative. I sent a note in mychart.

## 2024-06-01 NOTE — Telephone Encounter (Signed)
 Pt aware of results

## 2024-07-11 ENCOUNTER — Telehealth: Payer: Self-pay

## 2024-07-11 NOTE — Telephone Encounter (Signed)
 Returned the pt's phone call and she advised me that she would like for the 40,000 Zenpep  sent to her pharmacy is what she prefers and that this weekend she had a bad time with constipation but she took Colace and it helped her with constipation and abd pain. Pt states she really does appreciate this

## 2024-07-12 ENCOUNTER — Encounter: Payer: Self-pay | Admitting: Gastroenterology

## 2024-07-12 ENCOUNTER — Telehealth: Payer: Self-pay

## 2024-07-12 MED ORDER — ZENPEP 40000-126000 UNITS PO CPEP: 40000.0000 [IU] | ORAL_CAPSULE | Freq: Three times a day (TID) | ORAL | 3 refills | Status: DC

## 2024-07-12 NOTE — Telephone Encounter (Signed)
 Phoned and LMOVM regarding the pt's Rx of Zenpep  being sent to her pharmacy.

## 2024-07-12 NOTE — Telephone Encounter (Signed)
 I sent in 40,000 units (1 capsule) three times a day with food, and she can take 1 with snacks if it's a higher fat meal. I think she will do well with the 3 times a day with meals.

## 2024-07-12 NOTE — Telephone Encounter (Signed)
 [facility] [employer] phoned advising that for insurance purposes please specify the number of caps person is to take a day or is allowed. I counted 4 off the Rx you sent in

## 2024-07-12 NOTE — Addendum Note (Signed)
 Addended by: SHIRLEAN THERISA ORN on: 07/12/2024 02:24 PM   Modules accepted: Orders

## 2024-07-13 MED ORDER — ZENPEP 40000-126000 UNITS PO CPEP: 40000.0000 [IU] | ORAL_CAPSULE | Freq: Three times a day (TID) | ORAL | 3 refills | Status: AC

## 2024-07-13 NOTE — Addendum Note (Signed)
 Addended by: SHIRLEAN THERISA ORN on: 07/13/2024 11:11 AM   Modules accepted: Orders

## 2024-07-13 NOTE — Telephone Encounter (Signed)
 Completed.

## 2024-08-07 NOTE — Progress Notes (Unsigned)
 "  Established Patient Pulmonology Office Visit   Subjective:  Patient ID: Heidi [name], female    DOB: [birth year]  MRN: [id number]  CC: No chief complaint on file.   HPI  Heidi [name] is a 68 y.o. female with HTN, HLD, T2DM, GERD, CKD stage 3, OSA, and severe persistent asthma who presents for follow up.  Last seen on 05/16/2024, at the time, continued Breztri, given her SOS prednisone . I sent her to Dr. Emilio for dental appliance given hx of OSA.  {PULM QUESTIONNAIRES (Optional):33196}  ROS  {History (Optional):23778} Current Medications[1]      Objective:  LMP 05/20/2012  {Pulm Vitals (Optional):32837}  Physical Exam   Diagnostic Review:  {Labs (Optional):32838}     Assessment & Plan:   Assessment & Plan Obstructive sleep apnea  Severe persistent asthma without complication (HCC)   No orders of the defined types were placed in this encounter.     No follow-ups on file.   Kirbi Farrugia, MD    [1]  Current Outpatient Medications:    ACCU-CHEK GUIDE test strip, See admin instructions., Disp: , Rfl:    Albuterol  Sulfate (PROAIR  RESPICLICK) 108 (90 Base) MCG/ACT AEPB, Inhale 1 puff into the lungs every 6 (six) hours as needed (shortness of breath or wheezing)., Disp: , Rfl:    allopurinol  (ZYLOPRIM ) 100 MG tablet, Take 100 mg by mouth 2 (two) times daily as needed (gout)., Disp: , Rfl:    ALPRAZolam (XANAX) 0.5 MG tablet, Take 0.5 mg by mouth 2 (two) times daily as needed for anxiety., Disp: , Rfl:    apixaban (ELIQUIS) 5 MG TABS tablet, Take 5 mg by mouth 2 (two) times daily., Disp: , Rfl:    atorvastatin (LIPITOR) 10 MG tablet, 1 tablet Orally Once a day; Duration: 90 days, Disp: , Rfl:    azithromycin  (ZITHROMAX ) 250 MG tablet, Take 1 tablet (250 mg total) by mouth daily., Disp: 6 tablet, Rfl: 0   B-D 3CC LUER-LOK SYR 25GX1 25G X 1 3 ML MISC, USE AS DIRECTED MONTHLY, Disp: , Rfl:    BD PEN NEEDLE MICRO U/F 32G X 6 MM MISC, Inject into the skin  daily., Disp: , Rfl:    benazepril (LOTENSIN) 10 MG tablet, 1 tablet Orally Once a day; Duration: 90 days, Disp: , Rfl:    benztropine  (COGENTIN ) 0.5 MG tablet, Take 0.5 mg by mouth 2 (two) times daily as needed for tremors., Disp: , Rfl:    Biotin 5 MG TABS, Take 5 mg by mouth daily., Disp: , Rfl:    BREZTRI AEROSPHERE 160-9-4.8 MCG/ACT AERO, Inhale 2 puffs into the lungs in the morning and at bedtime., Disp: , Rfl:    busPIRone (BUSPAR) 10 MG tablet, Take 5-10 mg by mouth 3 (three) times daily as needed., Disp: , Rfl:    Cholecalciferol  (VITAMIN D ) 50 MCG (2000 UT) CAPS, Take 1 capsule (2,000 Units total) by mouth daily., Disp: 90 capsule, Rfl: 3   colchicine 0.6 MG tablet, Take 0.6 mg by mouth daily as needed (gout flare)., Disp: , Rfl:    cyanocobalamin  (,VITAMIN B-12,) 1000 MCG/ML injection, Inject 1,000 mcg into the muscle every 30 (thirty) days., Disp: , Rfl:    denosumab (PROLIA) 60 MG/ML SOSY injection, as directed Subcutaneous, Disp: , Rfl:    dexlansoprazole  (DEXILANT ) 60 MG capsule, 1 capsule., Disp: , Rfl:    dexlansoprazole  (DEXILANT ) 60 MG capsule, Take 1 capsule (60 mg total) by mouth daily., Disp: 90 capsule, Rfl: 1   DULoxetine  (  CYMBALTA ) 60 MG capsule, Take 60 mg by mouth 2 (two) times daily., Disp: , Rfl:    fluconazole  (DIFLUCAN ) 150 MG tablet, Take 1 tablet (150 mg total) by mouth daily., Disp: 2 tablet, Rfl: 0   Fluocinolone  Acetonide (DERMOTIC ) 0.01 % OIL, Place 2 drops in ear(s) in the morning and at bedtime., Disp: 20 mL, Rfl: 2   folic acid  (FOLVITE ) 1 MG tablet, Take 1 mg by mouth daily., Disp: , Rfl:    hydrOXYzine (ATARAX) 10 MG tablet, Take 10 mg by mouth 3 (three) times daily as needed for anxiety., Disp: , Rfl:    lamoTRIgine  (LAMICTAL ) 150 MG tablet, Take 150 mg by mouth 2 (two) times daily., Disp: , Rfl:    leucovorin (WELLCOVORIN) 5 MG tablet, 1 tablet Orally Once a week; Duration: 30 days (Patient not taking: Reported on 05/23/2024), Disp: , Rfl:    metFORMIN  (GLUCOPHAGE-XR) 500 MG 24 hr tablet, Take 500 mg by mouth in the morning and at bedtime., Disp: , Rfl:    MOUNJARO 15 MG/0.5ML Pen, SMARTSIG:15 Milligram(s) SUB-Q Once a Week, Disp: , Rfl:    oxybutynin  (DITROPAN ) 5 MG tablet, 1 tablet Orally BID; Duration: 30 days, Disp: , Rfl:    Pancrelipase , Lip-Prot-Amyl, (ZENPEP ) 40000-126000 units CPEP, With meals, Disp: , Rfl:    Pancrelipase , Lip-Prot-Amyl, (ZENPEP ) 40000-126000 units CPEP, Take 1 capsule (40,000 Units total) by mouth with breakfast, with lunch, and with evening meal. One with snacks, no more than 5 per day., Disp: 180 capsule, Rfl: 3   Pancrelipase , Lip-Prot-Amyl, (ZENPEP ) 60000-189600 units CPEP, Take 1 capsule by mouth with breakfast, with lunch, and with evening meal. Take 1 with high fat snacks. Up to 5 capsules a day., Disp: 150 capsule, Rfl: 3   pregabalin  (LYRICA ) 75 MG capsule, Take 75 mg by mouth 3 (three) times daily., Disp: , Rfl:    propranolol (INDERAL) 10 MG tablet, Take 10 mg by mouth 2 (two) times daily., Disp: , Rfl:    Vibegron (GEMTESA) 75 MG TABS, 1 tablet Orally Once a day, Disp: , Rfl:   "

## 2024-08-08 ENCOUNTER — Ambulatory Visit (INDEPENDENT_AMBULATORY_CARE_PROVIDER_SITE_OTHER): Admitting: Pulmonary Disease

## 2024-08-08 ENCOUNTER — Encounter: Payer: Self-pay | Admitting: Pulmonary Disease

## 2024-08-08 VITALS — BP 106/71 | HR 68 | Ht 66.0 in | Wt 205.0 lb

## 2024-08-08 DIAGNOSIS — J4550 Severe persistent asthma, uncomplicated: Secondary | ICD-10-CM | POA: Diagnosis not present

## 2024-08-08 DIAGNOSIS — N1832 Chronic kidney disease, stage 3b: Secondary | ICD-10-CM

## 2024-08-08 DIAGNOSIS — G4733 Obstructive sleep apnea (adult) (pediatric): Secondary | ICD-10-CM | POA: Diagnosis not present

## 2024-08-08 MED ORDER — ALBUTEROL SULFATE HFA 108 (90 BASE) MCG/ACT IN AERS: 2.0000 | INHALATION_SPRAY | RESPIRATORY_TRACT | 6 refills | Status: AC | PRN

## 2024-08-08 MED ORDER — SPIRONOLACTONE 25 MG PO TABS: 25.0000 mg | ORAL_TABLET | Freq: Every day | ORAL | 3 refills | Status: AC

## 2024-08-08 NOTE — Patient Instructions (Signed)
" °  VISIT SUMMARY: You came in for a follow-up on your respiratory status and medication management. Your asthma is well-controlled, but you have a night cough likely due to Sjogren's disease. We discussed your dislike of the CPAP machine for sleep apnea and made a referral for a dental appliance. You also need to restart your spironolactone  and get blood work done after restarting it.  YOUR PLAN: SEVERE PERSISTENT ASTHMA: Your asthma is well-controlled with Breztri, but you have a night cough likely due to Sjogren's disease. -Continue using Breztri as prescribed, twice daily at 2 AM and 2 PM. -Your albuterol  prescription has been refilled.  OBSTRUCTIVE SLEEP APNEA: You are using a CPAP machine but dislike it. We have made a referral for a dental appliance. -Contact Dr. Emilio to schedule an appointment for a dental appliance. -You can discontinue CPAP use once the dental appliance is fitted.  STAGE 3B CHRONIC KIDNEY DISEASE: You are out of spironolactone  and need to restart it. Blood work is needed after restarting the medication. -Restart spironolactone  at 25 mg daily. -Get blood work done 10-14 days after restarting spironolactone .  Contains text generated by [employer].   "

## 2024-08-19 ENCOUNTER — Other Ambulatory Visit (INDEPENDENT_AMBULATORY_CARE_PROVIDER_SITE_OTHER): Payer: Self-pay

## 2024-08-19 ENCOUNTER — Encounter (INDEPENDENT_AMBULATORY_CARE_PROVIDER_SITE_OTHER): Payer: Self-pay

## 2024-08-19 ENCOUNTER — Telehealth (INDEPENDENT_AMBULATORY_CARE_PROVIDER_SITE_OTHER): Payer: Self-pay

## 2024-08-19 DIAGNOSIS — J383 Other diseases of vocal cords: Secondary | ICD-10-CM

## 2024-08-19 DIAGNOSIS — R490 Dysphonia: Secondary | ICD-10-CM

## 2024-08-19 NOTE — Telephone Encounter (Signed)
 Patient called in wanting a referral to a specialist regarding her Laryngitis.  Clinical Research Associate).  Please advise.  [phone].

## 2024-08-19 NOTE — Telephone Encounter (Signed)
 Called patient to let her know that we received her message and that Dr. Greggory put in the order for the referral. Also let the patient know if she had any further questions to give us  a call back.

## 2024-08-24 ENCOUNTER — Ambulatory Visit: Payer: Self-pay | Admitting: Pulmonary Disease

## 2024-08-24 LAB — BASIC METABOLIC PANEL WITH GFR
BUN/Creatinine Ratio: 16 (ref 12–28)
BUN: 14 mg/dL (ref 8–27)
CO2: 22 mmol/L (ref 20–29)
Calcium: 9.3 mg/dL (ref 8.7–10.3)
Chloride: 100 mmol/L (ref 96–106)
Creatinine, Ser: 0.88 mg/dL (ref 0.57–1.00)
Glucose: 80 mg/dL (ref 70–99)
Potassium: 4.3 mmol/L (ref 3.5–5.2)
Sodium: 137 mmol/L (ref 134–144)
eGFR: 72 mL/min/1.73

## 2024-08-26 ENCOUNTER — Telehealth: Payer: Self-pay

## 2024-08-26 NOTE — Telephone Encounter (Signed)
 Copied from CRM [id number]. Topic: Clinical - Medication Question >> Aug 24, 2024  9:56 AM Devaughn RAMAN wrote: Reason for CRM: Pt calling in regards to Breztri medication, pt needs Dr.A to fax a rx to [facility]. Pt does not have the fax number for [facility] but she stated she spoke with Sam and stated she had the fax.   Routing to RDS pool.

## 2024-08-29 NOTE — Telephone Encounter (Signed)
 Spoke with pt and she has gotten her breztri

## 2024-09-07 ENCOUNTER — Ambulatory Visit: Payer: Self-pay | Admitting: Pulmonary Disease

## 2024-09-07 NOTE — Telephone Encounter (Signed)
 FYI Only or Action Required?: Action required by provider: request for appointment, clinical question for provider, and update on patient condition.  Patient is followed in Pulmonology for asthma and OSA, last seen on 08/08/2024 by Catherine Cools, MD.  Called Nurse Triage reporting Hemoptysis.  Symptoms began several days ago.  Interventions attempted: Rescue inhaler.  Symptoms are: rapidly worsening.  Triage Disposition: Call EMS 911 Now  Patient/caregiver understands and will follow disposition?: No, refuses disposition       Reason for Disposition  [1] Chest pain AND [2] difficulty breathing  Answer Assessment - Initial Assessment Questions This RN recommended pt be examined in hospital asap, pt refusing, requesting appt with Dr. Catherine if at all possible. Advised pt call 911 or get to hospital asap if any new or worsening symptoms, pt states she will if any worsening. Sending message to [facility] office for call back to pt with further recommendations.      2. SEVERITY: How bad is the cough today? Did the blood appear after a coughing spell?      Choking on mucus  4. HEMOPTYSIS: How much blood? (e.g., flecks, streaks, tablespoons)     Looked like a clot the size of tip of finger, not real dark Later choked on mucus and coughed more up but not as much  5. DIFFICULTY BREATHING: Are you having difficulty breathing? If Yes, ask: How bad is it? (e.g., mild, moderate, severe)      Using inhaler a little bit more, nothing to get excited about  6. FEVER: Do you have a fever? If Yes, ask: What is your temperature, how was it measured, and when did it start?     Denies  8. LUNG HISTORY: Do you have any history of lung disease?  (e.g., pulmonary embolus, asthma, emphysema)     Asthma, OSA  10. OTHER SYMPTOMS: Do you have any other symptoms? (e.g., runny nose, wheezing, chest pain)       Chest pain little bit, confirms different from her usual with  SOB  Denies: Struggling to breathe Fever  Protocols used: Coughing Up Blood-A-AH

## 2024-09-07 NOTE — Telephone Encounter (Signed)
 Atc x1 lmtcb

## 2024-09-08 NOTE — Telephone Encounter (Signed)
 Called patient. She notes everyone is sick around her with RSV. She has sore throat, wheezing, shorntess of breath and coughing blood tinges in her phlegm. She started a zpack that I gave her and I told her to start a prednisone  dose pack I gave her. Advised her to continue maintenance inhaler and to do albuterol  neb TID. She will get a pulse ox and if SPO2 < 88%, she will go to emergency room. I also told her if she has crushing chest pain, progressive dyspnea, more hemoptysis to go to the ED. She noted understanding. She will call back if she ends up going to ED. This is likely an asthma exacerbation due to viral LRTI.

## 2024-09-08 NOTE — Telephone Encounter (Signed)
 Pt states she has been coughing up bright red blood (not much) yesterday but doing better today, started doxy and pred that Alghanim gave when he was in [place] and now she is out of diflucan  and requesting and refill on that and Dr Brett next recommendation on what she should do  Pt states she did not refuse ED but wanted Drs irby first.  Please advise

## 2024-09-09 NOTE — Telephone Encounter (Signed)
 Copied from CRM #[id number]. Topic: Clinical - Medication Question >> Sep 08, 2024  9:21 AM Ismael A wrote: Reason for CRM: patient is requesting a prescription of Diflucan  to be sent out for her - patient was also returning call from [facility] - she states she is better this morning, she has not coughing up anymore blood, breathing is better, and she has started antibiotic Azithromycin  - she states she will be resting for the day -  See last phone note

## 2024-09-14 ENCOUNTER — Telehealth: Payer: Self-pay

## 2024-09-14 DIAGNOSIS — J4550 Severe persistent asthma, uncomplicated: Secondary | ICD-10-CM

## 2024-09-14 MED ORDER — FLUCONAZOLE 150 MG PO TABS: 150.0000 mg | ORAL_TABLET | Freq: Every day | ORAL | 0 refills | Status: AC
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
## 2024-09-14 NOTE — Telephone Encounter (Signed)
 Submitted order for Diflucan  as requested.

## 2024-09-18 ENCOUNTER — Encounter (INDEPENDENT_AMBULATORY_CARE_PROVIDER_SITE_OTHER): Payer: Self-pay

## 2024-09-20 ENCOUNTER — Ambulatory Visit: Admitting: Gastroenterology

## 2025-02-13 ENCOUNTER — Ambulatory Visit: Admitting: Pulmonary Disease
